# Patient Record
Sex: Female | Born: 1980 | Race: White | Hispanic: No | Marital: Married | Smoking: Current every day smoker
Health system: Southern US, Community
[De-identification: ages and names within clinical notes are randomized; demographics above are authoritative.]

## PROBLEM LIST (undated history)

## (undated) DIAGNOSIS — T753XXA Motion sickness, initial encounter: Secondary | ICD-10-CM

## (undated) DIAGNOSIS — R519 Headache, unspecified: Secondary | ICD-10-CM

## (undated) DIAGNOSIS — Z973 Presence of spectacles and contact lenses: Secondary | ICD-10-CM

## (undated) DIAGNOSIS — Z87442 Personal history of urinary calculi: Secondary | ICD-10-CM

## (undated) DIAGNOSIS — F329 Major depressive disorder, single episode, unspecified: Secondary | ICD-10-CM

## (undated) DIAGNOSIS — R3129 Other microscopic hematuria: Secondary | ICD-10-CM

## (undated) DIAGNOSIS — F419 Anxiety disorder, unspecified: Secondary | ICD-10-CM

## (undated) DIAGNOSIS — R51 Headache: Secondary | ICD-10-CM

## (undated) DIAGNOSIS — F32A Depression, unspecified: Secondary | ICD-10-CM

## (undated) HISTORY — DX: Depression, unspecified: F32.A

## (undated) HISTORY — PX: MANDIBLE FRACTURE SURGERY: SHX706

## (undated) HISTORY — DX: Anxiety disorder, unspecified: F41.9

## (undated) HISTORY — DX: Other microscopic hematuria: R31.29

## (undated) HISTORY — DX: Major depressive disorder, single episode, unspecified: F32.9

## (undated) HISTORY — PX: OTHER SURGICAL HISTORY: SHX169

## (undated) HISTORY — DX: Personal history of urinary calculi: Z87.442

## (undated) HISTORY — PX: BREAST SURGERY: SHX581

---
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
## 2002-03-18 ENCOUNTER — Other Ambulatory Visit: Admission: RE | Admit: 2002-03-18 | Discharge: 2002-03-18 | Payer: Self-pay | Admitting: Obstetrics and Gynecology

## 2002-04-03 ENCOUNTER — Encounter: Payer: Self-pay | Admitting: Obstetrics and Gynecology

## 2002-04-03 ENCOUNTER — Ambulatory Visit (HOSPITAL_COMMUNITY): Admission: RE | Admit: 2002-04-03 | Discharge: 2002-04-03 | Payer: Self-pay | Admitting: Obstetrics and Gynecology

## 2002-04-17 ENCOUNTER — Observation Stay (HOSPITAL_COMMUNITY): Admission: AD | Admit: 2002-04-17 | Discharge: 2002-04-18 | Payer: Self-pay | Admitting: Obstetrics and Gynecology

## 2002-08-17 ENCOUNTER — Inpatient Hospital Stay (HOSPITAL_COMMUNITY): Admission: AD | Admit: 2002-08-17 | Discharge: 2002-08-17 | Payer: Self-pay | Admitting: Obstetrics and Gynecology

## 2002-08-17 ENCOUNTER — Inpatient Hospital Stay (HOSPITAL_COMMUNITY): Admission: AD | Admit: 2002-08-17 | Discharge: 2002-08-20 | Payer: Self-pay | Admitting: Obstetrics and Gynecology

## 2002-09-24 ENCOUNTER — Encounter (INDEPENDENT_AMBULATORY_CARE_PROVIDER_SITE_OTHER): Payer: Self-pay | Admitting: Specialist

## 2002-09-24 ENCOUNTER — Encounter: Payer: Self-pay | Admitting: Obstetrics and Gynecology

## 2002-09-24 ENCOUNTER — Encounter: Admission: RE | Admit: 2002-09-24 | Discharge: 2002-09-24 | Payer: Self-pay | Admitting: Obstetrics and Gynecology

## 2002-09-24 ENCOUNTER — Other Ambulatory Visit: Admission: RE | Admit: 2002-09-24 | Discharge: 2002-09-24 | Payer: Self-pay | Admitting: Radiology

## 2003-03-31 ENCOUNTER — Other Ambulatory Visit: Admission: RE | Admit: 2003-03-31 | Discharge: 2003-03-31 | Payer: Self-pay | Admitting: Obstetrics and Gynecology

## 2004-03-25 ENCOUNTER — Emergency Department (HOSPITAL_COMMUNITY): Admission: EM | Admit: 2004-03-25 | Discharge: 2004-03-25 | Payer: Self-pay | Admitting: Emergency Medicine

## 2004-04-14 ENCOUNTER — Other Ambulatory Visit: Admission: RE | Admit: 2004-04-14 | Discharge: 2004-04-14 | Payer: Self-pay | Admitting: Obstetrics and Gynecology

## 2005-06-14 ENCOUNTER — Other Ambulatory Visit: Admission: RE | Admit: 2005-06-14 | Discharge: 2005-06-14 | Payer: Self-pay | Admitting: Obstetrics and Gynecology

## 2007-09-25 ENCOUNTER — Emergency Department (HOSPITAL_COMMUNITY): Admission: EM | Admit: 2007-09-25 | Discharge: 2007-09-26 | Payer: Self-pay | Admitting: Emergency Medicine

## 2008-01-15 ENCOUNTER — Emergency Department (HOSPITAL_COMMUNITY): Admission: EM | Admit: 2008-01-15 | Discharge: 2008-01-15 | Payer: Self-pay | Admitting: Emergency Medicine

## 2008-11-14 ENCOUNTER — Inpatient Hospital Stay (HOSPITAL_COMMUNITY): Admission: AD | Admit: 2008-11-14 | Discharge: 2008-11-16 | Payer: Self-pay | Admitting: Obstetrics and Gynecology

## 2008-11-14 ENCOUNTER — Encounter (INDEPENDENT_AMBULATORY_CARE_PROVIDER_SITE_OTHER): Payer: Self-pay

## 2011-05-03 NOTE — H&P (Signed)
Janice, Gray NO.:  [id number]   MEDICAL RECORD NO.:  [id number]          PATIENT TYPE:  INP   LOCATION:  9163                          FACILITY:  [facility]   PHYSICIAN:  Osborn Coho, M.D.   DATE OF BIRTH:  August 08, 1981   DATE OF ADMISSION:  11/14/2008  DATE OF DISCHARGE:                              HISTORY & PHYSICAL   [name] is a 30 year old gravida 4, para 1-0-2-1 at 37-2/7 weeks who  presents today with spontaneous rupture of membranes at approximately 4  a.m. and onset of contractions prior to that time now every 5 to 7  minutes.  Pregnancy has been remarkable for:   1. Anxiety and depression with patient on Prozac during this      pregnancy.  2. Conception on oral contraceptive.  3. History of genital warts.  4. History of STDs in the past.  5. Irritable bowel syndrome.  6. History of abnormal Pap with occult blood in the past.  7. Decreased body mass index.  8. Negative group B strep.   PRENATAL LABORATORIES:  Blood type is B+, Rh antibody negative, VDRL  nonreactive, rubella titer positive, hepatitis B surface antigen  negative, HIV nonreactive, toxoplasmosis titers were negative, GC and  chlamydia cultures were negative in the 1st trimester.  Pap was normal.  First trimester screen was normal.  AFP was normal.  Glucola was 91.  Hemoglobin upon entry into practice was 12.8.  It was 11.8 at 26 weeks.  Group B strep and other cultures were negative at 36 weeks.   HISTORY OF PRESENT PREGNANCY:  The patient entered care at approximately  10 weeks.  She had been on Prozac and was followed by mental health.  She conceived on oral contraceptives.  She had a 1st trimester screen  that was normal.  She was treated for an upper respiratory infection at  14 weeks.  She had an ultrasound at her 1st prenatal visit with dating  indicative of an EDC of December 19.  Her dates by LMP were December 16.  At 18 weeks she had another ultrasound showing good  congruency with  dates.  She had adequate weight gain during her pregnancy.  She was  placed on iron at 28 weeks.  She received H1N1 vaccine at 34 weeks.  Group B strep culture and other cultures were done at 36 weeks.   OBSTETRICAL HISTORY:  In 2003 she had a vaginal birth of a female  infant, weight 6 pounds 7 ounces, at 40 weeks.  She was in labor 27  hours.  She had epidural anesthesia.  In 2007 she had a miscarriage.  In  2008 she had a termination of pregnancy.   MEDICAL HISTORY:  She was previously [place] and Yasmin user.  She did  conceive on [place].  She reports abnormal Pap in the past, but  colposcopies were normal.  She had Chlamydia several years before as  well as gonorrhea more than 2 years ago.  She has a history of genital  warts.  Her last outbreak was before her 1st pregnancy.  She reports  usual childhood illnesses.  She does get occasional yeast infections.  She has irritable bowel syndrome with constipation component, but is not  on any medication.  She has a history of some frequent UTIs; last one  about 4 months before her pregnancy.   SURGICAL HISTORY:  1. Surgery on jaw approximately 2001 with spacers placed.  2. She also has previously reported TAB.   She has no known medication allergies.   FAMILY HISTORY:  Maternal grandmother had some kind of kidney surgery.   SOCIAL HISTORY:  The patient is single.  Father of the baby is involved  and supportive.  His name is [employer]  Patient has some college.  She is a Set designer.  Her partner is high school educated.  He is  self-employed.  She is Caucasian.  She denies any religious affiliation.  She does have a history of anxiety and depression, and has been on  medication most of her adult life.  She is currently on Prozac.   GENETIC HISTORY:  Remarkable for father of the baby's 1st cousin born  with a nuchal cord which caused mental retardation, and  father of the  baby's father has multiple  sclerosis.   PHYSICAL EXAMINATION:  VITAL SIGNS:  Stable.  The patient is afebrile.  HEENT:  Within normal limits.  LUNGS:  Breath sounds are clear.  HEART:  Regular rate and rhythm without murmur.  BREASTS:  Soft and nontender.  ABDOMEN:  Fundal height is approximately 37 cm.  Estimated fetal weight  is 6 pounds.  Uterine contractions are every 3-5 minutes of moderate  quality.  The patient is noted to be leaking light meconium-stained  fluid.  Fetal heart rate is currently reassuring with no decelerations.  Cervix is 4, 80% vertex, at a -1 to a 0 station. EXTREMITIES:  Deep  tendon reflexes are 2+ without clonus.  There is a trace edema.   IMPRESSION:  1. Intrauterine pregnancy at 37-2/7 weeks.  2. Early labor with spontaneous rupture of membranes in light      meconium.  3. Negative group B strep.   PLAN:  1. Admit to the birthing suite with consult with Dr. Osborn Coho      who is attending physician.  2. Routine certified nurse midwife orders.  3. The patient does plan epidural.      Chip Boer L. Emilee Hero, C.N.M.      Osborn Coho, M.D.  Electronically Signed    VLL/MEDQ  D:  11/14/2008  T:  11/14/2008  Job:  [id number]

## 2011-05-06 NOTE — Discharge Summary (Signed)
[facility]  Patient:    Janice Gray, Janice Gray Visit Number: [id number] MRN: [id number]          Service Type: OBS Location: 9300 [id number] Attending Physician:  Leonard Schwartz Dictated by:   Saverio Danker, C.N.M. Admit Date:  04/17/2002 Discharge Date: 04/18/2002                             Discharge Summary  ADMISSION DIAGNOSES: 1. Intrauterine pregnancy at 22 weeks. 2. Nausea, vomiting, questionable viral syndrome.  DISCHARGE DIAGNOSES: 1. Intrauterine pregnancy at 22 weeks. 2. Nausea, vomiting, questionable viral syndrome. 3. Resolved nausea and vomiting.  PROCEDURES THIS ADMISSION:    None.  HOSPITAL COURSE:              Janice Gray is an 30 year old single white female, gravida 1, para 0, at approximately [redacted] weeks gestation, who presented with nausea and vomiting for the last 24 hours and has been unable to keep anything down.  She was ketonuric on admission and had 30 of protein and moderate blood.  She denied any vaginal bleeding, leaking or cramping.  She was started on IV hydration with IV Phenergan and has tolerated liquids and diet this morning without difficulty.  She has no further nausea or vomiting since 9:15 p.m. on April 17, 2002.  She is requesting to go home at this time.  Her urinalysis is negative for ketones, negative for protein and negative for blood and has a specific gravity of 1.020.  She is deemed ready for discharge this morning.  DISCHARGE INSTRUCTIONS:       She is to call for reoccurring nausea and vomiting, vaginal leaking or bleeding or other signs of preterm labor and for fever of greater than 100.4.  FOLLOWUP:                     Her discharge followup will be as scheduled at [facility] or as needed.  DISCHARGE LABORATORY AND ACCESSORY DATA:  Her hemoglobin is 12.6, her WBC count is 13.9, her platelets are 295,000.  Her AST is 23, her ALT is 15, her lipase is 20, her amylase is 86.  Her  urine is completely negative this time and specific gravity of 1.020.  DISCHARGE MEDICATIONS:        Phenergan 25 mg p.o. q.6h. p.r.n. for nausea and vomiting and the patient has a prescription for this at home. Dictated by:   Vance Gather Duplantis, C.N.M. Attending Physician:  Leonard Schwartz DD:  04/18/02 TD:  04/21/02 Job: [id number] [id number]

## 2011-05-06 NOTE — Op Note (Signed)
[place]  Patient:    Janice Gray, Janice Gray                          MRN: [id number] Proc. Date: 08/01/00 Adm. Date:  [id number] Disc. 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  SURGERY PERFORMED:  Bilateral sagittal split ramus osteotomies with mandibular advancement of approximately 11 mm, insertion of custom Acrylic interocclusal plates, rigid internal fixation using the Zachery Dauer 2.0 mm titanium system.  SURGEON: Hewitt Blade, M.D.  FIRST ASSISTANT:  Marshall  ANESTHESIA:  General via nasoendotracheal intubation.  ESTIMATED BLOOD LOSS: Less than 200 cc.  FLUIDS REPLACED:  Approximately 1500 cc of crystalloid solution.  COMPLICATIONS:  None apparent.  INDICATION FOR PROCEDURE:  [name] is a 30 year old female who was referred to my office for evaluation and treatment of her mandibular retrognathia.  thp has had chronic problems with biting, chewing, and incising food in a normal type fashion as well as chronic temporomandibular joint dysfunction.  All conservative measures have been attempted to correct this but the deformity was too severe for any less intensive means of treatment other than surgical intervention.  DETAILS OF PROCEDURE:  On 08/01/00, Ms. Lewis was taken to [facility] hospital main operating suite where she was placed on the operating room table in a supine position.  Following successful nasoendotracheal intubation and general anesthesia, the patients face, neck, and oral cavity were prepped and draped in the usual sterile operating room fashion.  The hypopharynx was suctioned free of fluids and secretions,  and a moistened two inch vaginal pack was placed as a throat pack.  Attention was then directed intraorally, where approximately 8 cc of 0.5% Xylocaine containing 1:200,000 epinephrine were infiltrated in the right inferior alveolar neurovascular region, the soft tissues overlying the ascending ramus and right posterior mandibular buccal vestibule.  A #15 Bard-Parker blade was then used to create a 2.5 curvilinear incision beginning in the soft tissues over the ramus and brought through the mucoperiosteal tissues of the right lateral oblique ridge to a distance of approximately 1.5 cm lateral to the first molar tooth.  The #9 periosteal elevator was then used to reflect a full thickness mucoperiosteal flap which was elevated laterally and inferiorly to the inferior border of the mandible which was identified using curved freer elevator.  The mucoperiosteal tissues were then reflected off of the oblique ridge and up the ascending ramus to a distance of approximately 2.0 cm from the tip of the coronoid process where a Demon bone clamp was placed to retract the soft tissue superiorly.  The Stryker rotary osteotome with a 107 fissure bur followed by a Lindeman bur was then used to create a medial cortical horizontal osteotomy from just above the lingula where a Sheldon retractor was placed to protect the neurovascular bundle to the anterior midline of the ramus.  A channel retractor was then placed into the incision to protect the lateral soft tissues and the Lindeman bur with the Stryker rotary osteotome was used to create a lateral, vertical, cortical osteotomy from the inferior border of the mandible to a distance  of approximately 1.0 cm lateral to the second molar tooth.  The osteotomies were then joined across the superior oblique ridge through the superior cortical bone using the 107 fissure bur.  In a similar fashion, the left mandible was cut in preparation for a sagittal split ramus  osteotomy.  With the mandibular osteotome and gentle tapping pressures from a fiberglass tipped mallet, the left mandible was then split in a sagittal fashion.  The neurovascular bundle remained intact.   A pterygomasseteric sling stripper was then used to detach the pterygomasseteric apparatus from the distal portion of the mandible.  In a similar fashion, the right mandible was split in a sagittal fashion, the neurovascular bundle remaining intact, and the pterygomasseteric apparatus detached.  A previously constructed custom Acrylic intraocclusal appliance was then affixed to the maxillary dentition, fitting appropriately, and was secured by passing four 28 gauge stainless steel wire ligature loops in a circumorthodontic fashion.  The ends of the wires were cut, twisted, and rosetted atraumatically against the buccal surface of the maxillary gingival soft tissues.  The throat pack was removed, and the hypopharynx suctioned clear clear of fluids and secretions.  The mandible was now advanced approximately 11 mm and the mandibular dentition fit appropriately into the under surface of the appliance.  The mandible was now secured in its mid position by passing 26 gauge stainless steel wire over two loops with a circumorthodontic fashion.  The ends of the wires cut, twisted, and rosetted atraumatically against the buccal surface of the dentition.  The condylar segments were then positioned posteriorly and superiorly into the glenoid fossa using a Kocher bone clamp and a wire directing instrument. Digital pressure was used to ascertain fitting of the condyles bilaterally. The condylar segments were then secured in this position by passing bilateral a 24 gauge circumorthodontic round ligature stainless steel loops.  The ends of the wires twisted and rosetted against the superior oblique ridge portion of the mandible.  Rigid internal fixation was then applied in the following manner.  A 3 M  mini driver with a 1.5 mm drill was used to create three bicortical holes across the superior oblique bridge portion of the mandible bilaterally.  The holes  were then tapped using the Zachery Dauer tap, an appropriate 2.0 mm titanium screws were placed with three being placed on either side of the mandible. The aforementioned 24 gauge wires were then cut and removed from the surgical site.  The surgical site was then copiously irrigated with sterile saline irrigating solution and suctioned. The mucoperiosteal margins were approximated and sutured in a continuous interlocking fashion using 4-0 Vicryl suture material on a RV 1 needle.  He had maxillary fixation loops were then cut and removed from the oral cavity.  The mandible was now manipulated in an opened and closed position and fitting appropriately into the under surface of the appliance without deviation or hindrance.  Class 1 box Silastics  were then placed on the canine region bilaterally and the patient was allowed to awaken from the anesthesia and taken to the recovery room  where she tolerated the procedure well and without apparent complication. DD:  08/05/00 TD:  08/06/00 Job: [id number]

## 2011-05-06 NOTE — H&P (Signed)
Janice Gray, Janice Gray                           ACCOUNT NO.:  [id number]   MEDICAL RECORD NO.:  [id number]                   PATIENT TYPE:  INP   LOCATION:  9126                                 [facility]ILITY:  [facility]   PHYSICIAN:  Cam Hai, C.N.M.               DATE OF BIRTH:  Sep 26, 1981   DATE OF ADMISSION:  08/17/2002  DATE OF DISCHARGE:                                HISTORY & PHYSICAL   HISTORY:  The patient is a 30 year old single white female primigravida at  39-3/7ths weeks' who presents with regular uterine contractions all day. She  denies leaking, bleeding, headache, nausea or vomiting.  She reports  positive fetal movement.  She had a labor evaluation earlier today with  cervical examination of 2-3 cm.  Her pregnancy has been followed by the  [facility] Certified Nurse Midwife Service and is remarkable  for:  1. Chronic constipation and irritable bowel syndrome.  2. History of depression.  3. Unknown last menstrual period.  4. Smoker.  5. History of abnormal Pap smear.  6. Group B Strep negative.   Her prenatal laboratories were collected on January 29, 2002:  Hemoglobin  11.9, hematocrit 35.6, platelets 292,000, blood type B+, antibody negative,  RPR nonreactive, rubella immune, hepatitis B surface antigen negative, HIV  nonreactive, Pap smear within normal limits, gonorrhea negative, Chlamydia  negative.  February 20, 2002 her serum alpha-fetoprotein was within normal  range.  On June 29, 2002 her one-hour Glucola was 102 with hemoglobin of  11.8.  Culture of the vaginal tract for Group B Strep on July 25, 2002 was  negative.   HISTORY OF PRESENT PREGNANCY:  She presented for care on January 30, 2002  at approximately 12-14 weeks' gestation.  She resumed Paxil at her OB visit,  which she had been taking prior to the pregnancy but stopped with a positive  pregnancy test.  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  This was December 2002 with colposcopy recommended in March 2003.  Pregnancy  ultrasonography gave her [facility] of August 21, 2002.  Pregnancy  ultrasonography at 17 weeks', four-chamber heart and chest not seen.  She  had follow up ultrasound at [facility] which confirmed the anatomy.  Dr.  Pennie Rushing reviewed her Pap smear history and recommended that she have a  repeat Pap smear in October 2003.  She was measuring slightly size less than  dates so pregnancy ultrasonography at 28 weeks' was consistent with previous  growth and EDC.  Fluid was within normal limits.  Estimated fetal weight was  in the 39th - 42nd percentile.  Patient continues smoking one to two  cigarettes a day throughout the pregnancy.  It was recommended that she  stop.  Another pregnancy ultrasonography was done for growth at 35 weeks'  which showed  estimated fetal weight at 50th - 75th percentile with normal  amniotic fluid volume and cephalic presentation.  Rest of her prenatal care  was unremarkable.   OBSTETRICAL HISTORY:  Primigravida.   ALLERGIES:  No medication allergies.   PAST MEDICAL HISTORY:  She stopped using Yasmin  for birth control in  September 2002.  In November 2002 she had abnormal Pap smear with colposcopy  in December 2002.  In 2000 she was treated for positive gonorrhea.  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  She has an occasional yeast infection.  She reports having had the usual childhood illnesses.  She has history of  irritable bowel syndrome manifested by constipation.  She has urinary tract  infection every few months.  She has a history of migraine headaches.  She  has a history of depression and anxiety for which she sees Dr. Fabian Sharp.   PAST SURGICAL HISTORY:  1. Wisdom teeth extraction at the age of 43.  2. Jaw surgery two years ago.   FAMILY HISTORY:  Remarkable for maternal grandmother with questionable  anemia.  Maternal  grandmother with kidney problems.  Maternal grandmother  and mother with hypertension.  Patient's mother is a smoker.  Patient has  history of smoking as well.   GENETIC HISTORY:  Genetic history is negative.   SOCIAL HISTORY:  Father of the baby's name is Dorinda Hill.  He is involved and  supportive.  Patient has one year of college.  Father of baby has ninth  grade education.  No alcohol or illicit drug use with the pregnancy.   PHYSICAL EXAMINATION:  GENERAL:  She is afebrile.  HEENT:  Grossly within normal limits.  CHEST:  Clear to auscultation.  HEART:  Regular rate and rhythm.  ABDOMEN:  Gravid in contour.  Fundal height extending approximately 37 - 38  cm above the pubic symphysis.  Electronic fetal monitoring shows reactive  reassuring fetal heart rate.  Uterine contractions every five minutes.  Cervix 4 cm, 80%, vertex, -1 per R.N. examination.   ASSESSMENT:  1. Intrauterine pregnancy at term.  2. Early labor.  3. Group B Strep negative.   PLAN:  1. Admit to birthing unit for consult with Dr. Normand Sloop.  2. Routine M.D. orders.  3. Plan epidural for labor.                                               Cam Hai, C.N.M.    KS/MEDQ  D:  08/18/2002  T:  08/18/2002  Job:  [id number]

## 2011-05-06 NOTE — H&P (Signed)
[facility]  Patient:    Janice Gray, POLIMENI Visit Number: [id number] MRN: [id number]          Service Type: OBS Location: [facility] Attending Physician:  Leonard Schwartz Dictated by:   Mack Guise, C.N.M. Admit Date:  04/17/2002                           History and Physical  HISTORY OF PRESENT ILLNESS:   [name] is an 30 year old gravida 1 para 0 at 22 weeks by pregnancy ultrasonography.  She presents to MAU with nausea and vomiting since approximately midnight last night.  She has been unable to keep anything down, and although she has a history of chronic constipation and IBS, she now has severe diarrhea.  Phenergan suppositories at home have been ineffective.  This patient has a significant history for decreased BMI.  She is 5 feet 6 inches and entered pregnancy at 93 pounds.  At 22 weeks she is now 103 pounds.  She has no history of hyperemesis and questions whether she possibly has food poisoning, as her boyfriend also has had vomiting since eating out last evening.  Her pregnancy has been followed by the CNM service at [facility] and is remarkable for: 1. Questionable LMP. 2. Low BMI. 3. Chronic constipation, irritable bowel syndrome. 4. Depression and anxiety. 5. Smoker. 6. Abnormal Pap smear.  This patient was initially evaluated at the office of [facility] at approximately [redacted] weeks gestation.  Her pregnancy has been complicated by abnormal Pap smear and decreased BMI, depression and anxiety.  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 Fabian Sharp.  Colposcopy with biopsy showing CIN 1 in March 18, 2002 colposcopy.  Patient will follow up with Pap smears in October 2003 and then as indicated.  She has a history of gonorrhea treated in 2000, positive HPV.  She has a history of depression and anxiety, irritable bowel syndrome, chronic constipation.  SURGICAL HISTORY:             Wisdom teeth  age 61, jaw surgery two years ago.  FAMILY HISTORY:               Maternal great-grandfather with heart disease. Maternal great-grandmother with varicose veins.  Maternal grandmother with anemia.  Maternal grandmother with kidney problems.  The patient as well as her mother and maternal grandmother have a history of migraine headaches.  HABITS:                       Patient as well as her mother is a smoker. Patient denies the use of alcohol or illicit drugs.  GENETIC HISTORY:              There is no genetic or familial history of chromosomal disorders, children that died in infancy or that were born with birth defects.  SOCIAL HISTORY:               [name] is an 30 year old Caucasian female. The father of the baby, Janice Gray, is involved and supportive.  They are [employer] in their faith.  PHYSICAL EXAMINATION:  VITAL SIGNS:                  Stable, afebrile.  GENERAL:  Patient appears emaciated and pale.  HEENT:                        Unremarkable.  HEART:                        Regular rate and rhythm.  LUNGS:                        Clear.  ABDOMEN:                      Soft and nontender.  Positive Doppler fetal heart tones at 160.  EXTREMITIES:                  Show no pathologic edema.  DTRs are 1+ with no clonus.  LABORATORY DATA:              Clean catch urine finds specific gravity greater than 1.030, moderate hemoglobin, 40 ketones, 30 protein.  CBC shows WBC 13.9, hemoglobin 12.6; hematocrit 37.2; platelets 295,000.  Comprehensive metabolic panel is all within normal limits.  Lipase is 20 and amylase is 86.  ASSESSMENT:                   1. Intrauterine pregnancy at 22 weeks.                               2. Nausea and vomiting.                               3. Questionable viral syndrome, questionable                                  food poisoning, questionable bulimia or                                  anorexia.  PLAN:                          1. Admit per Dr. Marline Backbone for 23-hour                                  observation for IV hydration.                               2. D5-LR with multivitamins.                               3. Advance diet as tolerated.  Dictated by: Mack Guise, C.N.M. Attending Physician:  Leonard Schwartz DD:  04/17/02 TD:  04/17/02 Job: [id number]

## 2011-09-08 LAB — URINALYSIS, ROUTINE W REFLEX MICROSCOPIC
Bilirubin Urine: NEGATIVE
Ketones, ur: NEGATIVE
Protein, ur: 30 — AB
Urobilinogen, UA: 1

## 2011-09-08 LAB — POCT PREGNANCY, URINE
Operator id: 285491
Preg Test, Ur: NEGATIVE

## 2011-09-08 LAB — URINE MICROSCOPIC-ADD ON

## 2011-09-20 LAB — CBC
HCT: 27.9 — ABNORMAL LOW
HCT: 32.3 — ABNORMAL LOW
Hemoglobin: 10.8 — ABNORMAL LOW
Hemoglobin: 9.3 — ABNORMAL LOW
MCHC: 33.3
MCHC: 33.5
MCV: 86.7
MCV: 87.4
Platelets: 329
Platelets: 365
RBC: 3.19 — ABNORMAL LOW
RBC: 3.73 — ABNORMAL LOW
RDW: 14.5
RDW: 14.8
WBC: 15.5 — ABNORMAL HIGH
WBC: 19.5 — ABNORMAL HIGH

## 2011-09-20 LAB — DIFFERENTIAL
Basophils Absolute: 0
Lymphocytes Relative: 11 — ABNORMAL LOW
Monocytes Absolute: 0.9
Monocytes Relative: 5
Neutro Abs: 16.2 — ABNORMAL HIGH
Neutrophils Relative %: 83 — ABNORMAL HIGH

## 2011-09-20 LAB — RPR: RPR Ser Ql: NONREACTIVE

## 2011-09-29 LAB — URINALYSIS, ROUTINE W REFLEX MICROSCOPIC
Bilirubin Urine: NEGATIVE
Glucose, UA: NEGATIVE
Ketones, ur: NEGATIVE
Nitrite: NEGATIVE
Protein, ur: NEGATIVE
Specific Gravity, Urine: 1.02
Urobilinogen, UA: 1
pH: 6.5

## 2011-09-29 LAB — POCT PREGNANCY, URINE
Operator id: 272551
Preg Test, Ur: NEGATIVE

## 2011-09-29 LAB — URINE MICROSCOPIC-ADD ON

## 2012-08-23 ENCOUNTER — Emergency Department: Payer: Self-pay | Admitting: Emergency Medicine

## 2012-08-23 LAB — URINALYSIS, COMPLETE
Glucose,UR: NEGATIVE mg/dL (ref 0–75)
Nitrite: NEGATIVE
Ph: 6 (ref 4.5–8.0)

## 2013-06-16 ENCOUNTER — Emergency Department: Payer: Self-pay | Admitting: Internal Medicine

## 2013-06-16 LAB — URINALYSIS, COMPLETE
Nitrite: NEGATIVE
Ph: 5 (ref 4.5–8.0)
Squamous Epithelial: 4

## 2013-06-16 LAB — BASIC METABOLIC PANEL
BUN: 15 mg/dL (ref 7–18)
Chloride: 107 mmol/L (ref 98–107)
Creatinine: 0.85 mg/dL (ref 0.60–1.30)
EGFR (Non-African Amer.): >60
Glucose: 139 mg/dL — ABNORMAL HIGH (ref 65–99)
Osmolality: 284 (ref 275–301)

## 2013-06-16 LAB — HEPATIC FUNCTION PANEL A (ARMC)
Albumin: 4.3 g/dL (ref 3.4–5.0)
Bilirubin, Direct: <0.1 mg/dL (ref 0.00–0.20)
SGOT(AST): 16 U/L (ref 15–37)

## 2013-06-16 LAB — CBC
MCH: 30.7 pg (ref 26.0–34.0)
MCHC: 34.5 g/dL (ref 32.0–36.0)
RDW: 13.2 % (ref 11.5–14.5)

## 2013-06-21 LAB — CULTURE, BLOOD (SINGLE)

## 2014-01-30 ENCOUNTER — Emergency Department: Payer: Self-pay | Admitting: Emergency Medicine

## 2014-01-30 LAB — URINALYSIS, COMPLETE
Bilirubin,UR: NEGATIVE
Glucose,UR: NEGATIVE mg/dL
Ketone: NEGATIVE
Nitrite: NEGATIVE
Ph: 5
Protein: NEGATIVE
RBC,UR: 151 /HPF
Specific Gravity: 1.021
Squamous Epithelial: 2
WBC UR: 246 /HPF

## 2015-03-30 ENCOUNTER — Other Ambulatory Visit
Admit: 2015-03-30 | Disposition: A | Discharge status: Home / Self Care | Payer: Self-pay | Attending: Psychiatry | Admitting: Psychiatry

## 2015-03-30 LAB — TSH: THYROID STIMULATING HORM: 1.866 u[IU]/mL

## 2015-05-25 ENCOUNTER — Other Ambulatory Visit: Payer: Self-pay | Admitting: Psychiatry

## 2015-05-26 LAB — LIPID PANEL W/O CHOL/HDL RATIO
Cholesterol, Total: 150 mg/dL (ref 100–199)
HDL: 48 mg/dL (ref >39)
LDL CALC: 87 mg/dL (ref 0–99)
Triglycerides: 75 mg/dL (ref 0–149)
VLDL CHOLESTEROL CAL: 15 mg/dL (ref 5–40)

## 2015-05-26 LAB — GLUCOSE, RANDOM: Glucose: 76 mg/dL (ref 65–99)

## 2015-06-02 NOTE — Progress Notes (Signed)
Spokes patient on the phone today and informed her that her metabolic labs were all within normal limits. However while on the phone with her she asked about increasing her Seroquel from 50 mg to 100 mg. She states she's not had any side effects on the 50 mg thus far. Writer indicated this would be a reasonable increase and patient has follow-up already scheduled for 06/29/2015.

## 2015-06-29 ENCOUNTER — Encounter: Payer: Self-pay | Admitting: Psychiatry

## 2015-06-29 ENCOUNTER — Ambulatory Visit (INDEPENDENT_AMBULATORY_CARE_PROVIDER_SITE_OTHER): Payer: BLUE CROSS/BLUE SHIELD | Admitting: Psychiatry

## 2015-06-29 VITALS — BP 98/62 | HR 85 | Temp 97.9°F | Ht 66.0 in | Wt 136.4 lb

## 2015-06-29 DIAGNOSIS — F329 Major depressive disorder, single episode, unspecified: Secondary | ICD-10-CM | POA: Insufficient documentation

## 2015-06-29 DIAGNOSIS — F331 Major depressive disorder, recurrent, moderate: Secondary | ICD-10-CM

## 2015-06-29 DIAGNOSIS — F4010 Social phobia, unspecified: Secondary | ICD-10-CM | POA: Diagnosis not present

## 2015-06-29 DIAGNOSIS — F32A Depression, unspecified: Secondary | ICD-10-CM | POA: Insufficient documentation

## 2015-06-29 MED ORDER — QUETIAPINE FUMARATE 100 MG PO TABS: 150.0000 mg | ORAL_TABLET | Freq: Every day | ORAL | Status: DC

## 2015-06-29 MED ORDER — FLUOXETINE HCL 40 MG PO CAPS: 80.0000 mg | ORAL_CAPSULE | Freq: Two times a day (BID) | ORAL | Status: DC

## 2015-06-29 NOTE — Progress Notes (Signed)
BH MD/PA/NP OP Progress Note  06/29/2015 4:10 PM Janice Gray  MRN:  [id number]   Subjective: Patient presents for follow-up of her major depressive disorder and social anxiety disorder. She states overall things have been about the same. She did report she's had a couple of crying episodes with the most recent one being this past weekend. She does discuss some social stressors. She states that they recently moved her mother-in-law out of the house and that there were some financial costs related to this and now there is some financial stress.  She has not noticed much with the augmentation of Seroquel. She did state that one night she didn't take it and she noted she could not sleep. We have discussed alternatives to augmenting her Prozac such as using rig Rexulti.  She had a previous trial of  Abilify which caused some issues with her eyes. In addition we discussed whether it might make sense to switch her antidepressant from Prozac to something else and assess for response and augmentation to that antidepressant. He also relates that she had prior trial with Wellbutrin however related that the XL form cause problems while the SR may have been better tolerated and thus this might be something we can augment another antidepressant with. However patient has been tolerating the Seroquel well and we agreed the best plan at this point may be to increase her Seroquel from 100 mg at bedtime to 150 g at bedtime.  Chief Complaint:  Visit Diagnosis:  No diagnosis found.  Past Medical History:  Past Medical History  Diagnosis Date  . Anxiety   . Depression     Past Surgical History  Procedure Laterality Date  . Breast surgery    . Mandible fracture surgery     Family History:  Family History  Problem Relation Age of Onset  . Family history unknown: Yes   Social History:  History   Social History  . Marital Status: Single    Spouse Name: N/A  . Number of Children: N/A  . Years of Education:  N/A   Social History Main Topics  . Smoking status: Current Every Day Smoker -- 0.50 packs/day    Types: E-cigarettes, Cigarettes    Start date: 06/28/2000  . Smokeless tobacco: Never Used  . Alcohol Use: No     Comment: special occ./ social  . Drug Use: No  . Sexual Activity: Yes    Birth Control/ Protection: None   Other Topics Concern  . None   Social History Narrative  . None   Additional History:   Assessment:   Musculoskeletal: Strength & Muscle Tone: within normal limits Gait & Station: normal Patient leans: N/A  Psychiatric Specialty Exam: HPI  Review of Systems  Psychiatric/Behavioral: Positive for depression (She relates it is the same and has not gotten worse). Negative for suicidal ideas, hallucinations, memory loss and substance abuse. The patient is not nervous/anxious and does not have insomnia.     Blood pressure 98/62, pulse 85, temperature 97.9 F (36.6 C), temperature source Tympanic, height  (1.676 m), weight 136 lb 6.4 oz (61.871 kg), last menstrual period 06/29/2015, SpO2 99 %.Body mass index is 22.03 kg/(m^2).  General Appearance: Neat and Well Groomed  Eye Contact:  Good  Speech:  Normal Rate  Volume:  Normal  Mood:  same  Affect:  Constricted  Thought Process:  Linear and Logical  Orientation:  Full (Time, Place, and Person)  Thought Content:  Negative  Suicidal Thoughts:  No  Homicidal Thoughts:  No  Memory:  Immediate;   Good Recent;   Good Remote;   Good  Judgement:  Good  Insight:  Good  Psychomotor Activity:  Negative  Concentration:  Good  Recall:  Good  Fund of Knowledge: Good  Language: Good  Akathisia:  Negative  Handed:  Right unknown  AIMS (if indicated):  Not done  Assets:  Communication Skills Desire for Improvement Social Support Vocational/Educational  ADL's:  Intact  Cognition: WNL  Sleep:  good   Is the patient at risk to self?  No. Has the patient been a risk to self in the past 6 months?  No. Has the  patient been a risk to self within the distant past?  No. Is the patient a risk to others?  No. Has the patient been a risk to others in the past 6 months?  No. Has the patient been a risk to others within the distant past?  No.  Current Medications: Current Outpatient Prescriptions  Medication Sig Dispense Refill  . etonogestrel (NEXPLANON) 68 MG IMPL implant Inject into the skin.    Marland Kitchen. FLUoxetine (PROZAC) 40 MG capsule Take 2 capsules (80 mg total) by mouth 2 (two) times daily. 60 capsule 3  . QUEtiapine (SEROQUEL) 100 MG tablet Take 1.5 tablets (150 mg total) by mouth at bedtime. 45 tablet 2   No current facility-administered medications for this visit.    Medical Decision Making:  Established Problem, Stable/Improving (1)  Treatment Plan Summary:Medication management and Plan We will continue her Prozac at 80 mg daily. We will increase her Seroquel from 100 mg at bedtime to 150 g at bedtime. patient will follow up in 1 month   Janice Goinglton Janice Gray 06/29/2015, 4:10 PM

## 2015-07-27 ENCOUNTER — Ambulatory Visit (INDEPENDENT_AMBULATORY_CARE_PROVIDER_SITE_OTHER): Payer: BLUE CROSS/BLUE SHIELD | Admitting: Psychiatry

## 2015-07-27 ENCOUNTER — Encounter: Payer: Self-pay | Admitting: Psychiatry

## 2015-07-27 VITALS — BP 102/72 | HR 84 | Temp 97.3°F | Ht 66.0 in | Wt 140.8 lb

## 2015-07-27 DIAGNOSIS — F331 Major depressive disorder, recurrent, moderate: Secondary | ICD-10-CM | POA: Diagnosis not present

## 2015-07-27 MED ORDER — ALPRAZOLAM 0.25 MG PO TABS: 0.2500 mg | ORAL_TABLET | Freq: Every day | ORAL | Status: DC | PRN

## 2015-07-27 MED ORDER — BUPROPION HCL ER (XL) 150 MG PO TB24: 150.0000 mg | ORAL_TABLET | Freq: Every day | ORAL | Status: DC

## 2015-07-27 MED ORDER — ALPRAZOLAM 0.25 MG PO TABS: 0.2500 mg | ORAL_TABLET | Freq: Two times a day (BID) | ORAL | Status: DC | PRN

## 2015-07-27 NOTE — Progress Notes (Signed)
BH MD/PA/NP OP Progress Note  07/27/2015 4:14 PM Janice Gray  MRN:  [id number]   Subjective: Patient presents for follow-up of her major depressive disorder and social anxiety disorder. She relates that she found that the Seroquel just was not helpful in fact made her more depressed. She stated she began to taper herself off the Seroquel and currently is taking 50 mg at bedtime. I instructed her she could just discontinue the medication.  She indicated she felt like she wanted to try something else besides Prozac. I told her I thought this was a reasonable idea given that she is artery on 80 mg. We revisited her past use of Wellbutrin. She reported some insomnia with the Wellbutrin XL and then in the past with started on Wellbutrin SR but then states she got pregnant and this SR was discontinued. When I reviewed this with her she stated she really wanted to try the XL again. We have discussed that she can taper off the Prozac. I did discuss we could try to taper in 20 mg increments however she states she would prefer to just go down to her 40 mg capsules for 1 week and then discontinue it. She is aware that at that time she will start her Wellbutrin XL after she has been off of her Prozac.  Dates otherwise work continues to go well. She states socially there is less stress given that her mother-in-law has been moved out.  Chief Complaint:  Chief Complaint    Follow-up; Depression     Visit Diagnosis:  No diagnosis found.  Past Medical History:  Past Medical History  Diagnosis Date  . Anxiety   . Depression     Past Surgical History  Procedure Laterality Date  . Breast surgery    . Mandible fracture surgery     Family History:  Family History  Problem Relation Age of Onset  . Family history unknown: Yes   Social History:  History   Social History  . Marital Status: Single    Spouse Name: N/A  . Number of Children: N/A  . Years of Education: N/A   Social History Main Topics   . Smoking status: Current Every Day Smoker -- 0.50 packs/day    Types: E-cigarettes, Cigarettes    Start date: 06/28/2000  . Smokeless tobacco: Never Used  . Alcohol Use: No     Comment: special occ./ social  . Drug Use: No  . Sexual Activity: Yes    Birth Control/ Protection: None   Other Topics Concern  . None   Social History Narrative   Additional History:   Assessment:   Musculoskeletal: Strength & Muscle Tone: within normal limits Gait & Station: normal Patient leans: N/A  Psychiatric Specialty Exam: Depression        Associated symptoms include does not have insomnia and no suicidal ideas.   Review of Systems  Psychiatric/Behavioral: Positive for depression. Negative for suicidal ideas, hallucinations, memory loss and substance abuse. The patient is not nervous/anxious and does not have insomnia.     Blood pressure 102/72, pulse 84, temperature 97.3 F (36.3 C), temperature source Tympanic, height 5\' 6"  (1.676 m), weight 140 lb 12.8 oz (63.866 kg), last menstrual period 06/29/2015, SpO2 99 %.Body mass index is 22.74 kg/(m^2).  General Appearance: Neat and Well Groomed  Eye Contact:  Good  Speech:  Normal Rate  Volume:  Normal  Mood:  more depressed  Affect:  Constricted  Thought Process:  Linear and Logical  Orientation:  Full (Time, Place, and Person)  Thought Content:  Negative  Suicidal Thoughts:  No  Homicidal Thoughts:  No  Memory:  Immediate;   Good Recent;   Good Remote;   Good  Judgement:  Good  Insight:  Good  Psychomotor Activity:  Negative  Concentration:  Good  Recall:  Good  Fund of Knowledge: Good  Language: Good  Akathisia:  Negative  Handed:  Right unknown  AIMS (if indicated):  Not done  Assets:  Communication Skills Desire for Improvement Social Support Vocational/Educational  ADL's:  Intact  Cognition: WNL  Sleep:  good   Is the patient at risk to self?  No. Has the patient been a risk to self in the past 6 months?   No. Has the patient been a risk to self within the distant past?  No. Is the patient a risk to others?  No. Has the patient been a risk to others in the past 6 months?  No. Has the patient been a risk to others within the distant past?  No.  Current Medications: Current Outpatient Prescriptions  Medication Sig Dispense Refill  . FLUoxetine (PROZAC) 40 MG capsule Take 2 capsules (80 mg total) by mouth 2 (two) times daily. 60 capsule 3  . QUEtiapine (SEROQUEL) 100 MG tablet Take 1.5 tablets (150 mg total) by mouth at bedtime. 45 tablet 2  . ALPRAZolam (XANAX) 0.25 MG tablet Take 1 tablet (0.25 mg total) by mouth 2 (two) times daily as needed for anxiety. 60 tablet 0  . buPROPion (WELLBUTRIN XL) 150 MG 24 hr tablet Take 1 tablet (150 mg total) by mouth daily. 30 tablet 1  . etonogestrel (NEXPLANON) 68 MG IMPL implant Inject into the skin.     No current facility-administered medications for this visit.    Medical Decision Making:  Established Problem, Stable/Improving (1)  Treatment Plan Summary:Medication management and Plan patient will take Prozac 40 mg daily for 7 days and then discontinue this. I further instructed her she can discontinue the Seroquel. She will start Wellbutrin XL 150 mg in the morning. Risk and benefits of been discussed patient's able consent. We'll start some alprazolam 0.25 mg twice a day as needed for anxiety. Patient has had this medication as a teenager. Risk and benefits of been discussing patient's able to consent.. patient will follow up in 1 month   Wallace Going 07/27/2015, 4:14 PM

## 2015-07-28 ENCOUNTER — Ambulatory Visit: Payer: BLUE CROSS/BLUE SHIELD | Admitting: Psychiatry

## 2015-08-20 ENCOUNTER — Encounter: Payer: Self-pay | Admitting: *Deleted

## 2015-08-20 ENCOUNTER — Emergency Department
Admission: EM | Admit: 2015-08-20 | Discharge: 2015-08-20 | Disposition: A | Discharge status: Home / Self Care | Payer: BLUE CROSS/BLUE SHIELD | Attending: Emergency Medicine | Admitting: Emergency Medicine

## 2015-08-20 DIAGNOSIS — N23 Unspecified renal colic: Secondary | ICD-10-CM | POA: Diagnosis not present

## 2015-08-20 DIAGNOSIS — R109 Unspecified abdominal pain: Secondary | ICD-10-CM | POA: Diagnosis present

## 2015-08-20 DIAGNOSIS — Z79899 Other long term (current) drug therapy: Secondary | ICD-10-CM | POA: Insufficient documentation

## 2015-08-20 DIAGNOSIS — Z720 Tobacco use: Secondary | ICD-10-CM | POA: Diagnosis not present

## 2015-08-20 LAB — URINALYSIS COMPLETE WITH MICROSCOPIC (ARMC ONLY)
BILIRUBIN URINE: NEGATIVE
Bacteria, UA: NONE SEEN
GLUCOSE, UA: NEGATIVE mg/dL
Ketones, ur: NEGATIVE mg/dL
Leukocytes, UA: NEGATIVE
Nitrite: NEGATIVE
Protein, ur: 30 mg/dL — AB
SQUAMOUS EPITHELIAL / LPF: NONE SEEN
Specific Gravity, Urine: 1.018 (ref 1.005–1.030)
pH: 5 (ref 5.0–8.0)

## 2015-08-20 LAB — CBC
HCT: 44.1 % (ref 35.0–47.0)
Hemoglobin: 14.6 g/dL (ref 12.0–16.0)
MCH: 29.3 pg (ref 26.0–34.0)
MCHC: 33.2 g/dL (ref 32.0–36.0)
MCV: 88.3 fL (ref 80.0–100.0)
PLATELETS: 284 10*3/uL (ref 150–440)
RBC: 4.99 MIL/uL (ref 3.80–5.20)
RDW: 13.6 % (ref 11.5–14.5)
WBC: 11.3 10*3/uL — AB (ref 3.6–11.0)

## 2015-08-20 LAB — BASIC METABOLIC PANEL
Anion gap: 8 (ref 5–15)
BUN: 14 mg/dL (ref 6–20)
CALCIUM: 9.1 mg/dL (ref 8.9–10.3)
CHLORIDE: 106 mmol/L (ref 101–111)
CO2: 25 mmol/L (ref 22–32)
CREATININE: 0.64 mg/dL (ref 0.44–1.00)
GFR calc Af Amer: >60 mL/min (ref >60)
GFR calc non Af Amer: >60 mL/min (ref >60)
Glucose, Bld: 116 mg/dL — ABNORMAL HIGH (ref 65–99)
Potassium: 3.8 mmol/L (ref 3.5–5.1)
SODIUM: 139 mmol/L (ref 135–145)

## 2015-08-20 MED ORDER — ONDANSETRON HCL 4 MG/2ML IJ SOLN
4.0000 mg | Freq: Once | INTRAMUSCULAR | Status: AC
  Administered 2015-08-20: 4 mg via INTRAVENOUS
  Filled 2015-08-20: qty 2

## 2015-08-20 MED ORDER — SODIUM CHLORIDE 0.9 % IV BOLUS (SEPSIS)
1000.0000 mL | Freq: Once | INTRAVENOUS | Status: AC
  Administered 2015-08-20: 1000 mL via INTRAVENOUS

## 2015-08-20 MED ORDER — OXYCODONE-ACETAMINOPHEN 5-325 MG PO TABS: 1.0000 | ORAL_TABLET | ORAL | Status: DC | PRN

## 2015-08-20 MED ORDER — HYDROMORPHONE HCL 1 MG/ML IJ SOLN
0.5000 mg | Freq: Once | INTRAMUSCULAR | Status: AC
  Administered 2015-08-20: 0.5 mg via INTRAVENOUS
  Filled 2015-08-20: qty 1

## 2015-08-20 MED ORDER — TAMSULOSIN HCL 0.4 MG PO CAPS: 0.4000 mg | ORAL_CAPSULE | Freq: Every day | ORAL | Status: DC

## 2015-08-20 MED ORDER — IBUPROFEN 600 MG PO TABS: 600.0000 mg | ORAL_TABLET | Freq: Three times a day (TID) | ORAL | Status: DC | PRN

## 2015-08-20 MED ORDER — TAMSULOSIN HCL 0.4 MG PO CAPS
0.4000 mg | ORAL_CAPSULE | Freq: Once | ORAL | Status: AC
  Administered 2015-08-20: 0.4 mg via ORAL
  Filled 2015-08-20: qty 1

## 2015-08-20 MED ORDER — KETOROLAC TROMETHAMINE 30 MG/ML IJ SOLN
30.0000 mg | Freq: Once | INTRAMUSCULAR | Status: AC
Start: 2015-08-20 — End: 2015-08-20
  Administered 2015-08-20: 30 mg via INTRAVENOUS
  Filled 2015-08-20: qty 1

## 2015-08-20 MED ORDER — ONDANSETRON HCL 4 MG PO TABS: 4.0000 mg | ORAL_TABLET | Freq: Three times a day (TID) | ORAL | Status: DC | PRN

## 2015-08-20 NOTE — ED Provider Notes (Signed)
[facility] Emergency Department Provider Note  ____________________________________________  Time seen: Approximately 3:32 AM  I have reviewed the triage vital signs and the nursing notes.   HISTORY  Chief Complaint Flank Pain    HPI Janice Gray is a 34 y.o. female who presents to the ED from home with a chief complaint of right flank pain. Patient describes waxing/waning right flank pain which began acutely at 10 PM tonight. Pain radiates to right lower quadrant and is associated with nausea. Patient has a history of nephrolithiasis never requiring lithotripsy. Denies fever, chills, chest pain, shortness of breath, vomiting, diarrhea, dysuria, hematuria. Nothing makes the pain better or worse.   Past Medical History  Diagnosis Date  . Anxiety   . Depression   Nephrolithiasis  Patient Active Problem List   Diagnosis Date Noted  . Clinical depression 06/29/2015    Past Surgical History  Procedure Laterality Date  . Breast surgery    . Mandible fracture surgery      Current Outpatient Rx  Name  Route  Sig  Dispense  Refill  . ALPRAZolam (XANAX) 0.25 MG tablet   Oral   Take 1 tablet (0.25 mg total) by mouth 2 (two) times daily as needed for anxiety.   60 tablet   0   . buPROPion (WELLBUTRIN XL) 150 MG 24 hr tablet   Oral   Take 1 tablet (150 mg total) by mouth daily.   30 tablet   1   . etonogestrel (NEXPLANON) 68 MG IMPL implant   Intradermal   Inject into the skin.         Marland Kitchen FLUoxetine (PROZAC) 40 MG capsule   Oral   Take 2 capsules (80 mg total) by mouth 2 (two) times daily.   60 capsule   3   . QUEtiapine (SEROQUEL) 100 MG tablet   Oral   Take 1.5 tablets (150 mg total) by mouth at bedtime.   45 tablet   2     Allergies Review of patient's allergies indicates no known allergies.  Family History  Problem Relation Age of Onset  . Family history unknown: Yes    Social History Social History  Substance Use Topics   . Smoking status: Current Every Day Smoker -- 0.50 packs/day    Types: E-cigarettes, Cigarettes    Start date: 06/28/2000  . Smokeless tobacco: Never Used  . Alcohol Use: No     Comment: special occ./ social    Review of Systems Constitutional: No fever/chills Eyes: No visual changes. ENT: No sore throat. Cardiovascular: Denies chest pain. Respiratory: Denies shortness of breath. Gastrointestinal: No abdominal pain.  No nausea, no vomiting.  No diarrhea.  No constipation. Genitourinary: Negative for dysuria. Musculoskeletal: Positive for right flank pain. Skin: Negative for rash. Neurological: Negative for headaches, focal weakness or numbness.  10-point ROS otherwise negative.  ____________________________________________   PHYSICAL EXAM:  VITAL SIGNS: ED Triage Vitals  Enc Vitals Group     BP 08/20/15 0251 115/75 mmHg     Pulse Rate 08/20/15 0251 79     Resp 08/20/15 0251 20     Temp 08/20/15 0251 97.3 F (36.3 C)     Temp Source 08/20/15 0251 Oral     SpO2 08/20/15 0251 99 %     Weight 08/20/15 0251 140 lb (63.504 kg)     Height 08/20/15 0251 5\' 6"  (1.676 m)     Head Cir --      Peak Flow --  Pain Score 08/20/15 0252 8     Pain Loc --      Pain Edu? --      Excl. in GC? --     Constitutional: Alert and oriented. Well appearing and in moderate acute distress. Eyes: Conjunctivae are normal. PERRL. EOMI. Head: Atraumatic. Nose: No congestion/rhinnorhea. Mouth/Throat: Mucous membranes are moist.  Oropharynx non-erythematous. Neck: No stridor.   Cardiovascular: Normal rate, regular rhythm. Grossly normal heart sounds.  Good peripheral circulation. Respiratory: Normal respiratory effort.  No retractions. Lungs CTAB. Gastrointestinal: Soft and nontender. No distention. No abdominal bruits. Mild right CVA tenderness. Musculoskeletal: No lower extremity tenderness nor edema.  No joint effusions. Neurologic:  Normal speech and language. No gross focal  neurologic deficits are appreciated. No gait instability. Skin:  Skin is warm, dry and intact. No rash noted. Psychiatric: Mood and affect are normal. Speech and behavior are normal.  ____________________________________________   LABS (all labs ordered are listed, but only abnormal results are displayed)  Labs Reviewed  BASIC METABOLIC PANEL - Abnormal; Notable for the following:    Glucose, Bld 116 (*)    All other components within normal limits  CBC - Abnormal; Notable for the following:    WBC 11.3 (*)    All other components within normal limits  URINALYSIS COMPLETEWITH MICROSCOPIC ([facility] ONLY) - Abnormal; Notable for the following:    Color, Urine YELLOW (*)    APPearance CLOUDY (*)    Hgb urine dipstick 3+ (*)    Protein, ur 30 (*)    All other components within normal limits   ____________________________________________  EKG  none ____________________________________________  RADIOLOGY  none ____________________________________________   PROCEDURES  Procedure(s) performed: None  Critical Care performed: No  ____________________________________________   INITIAL IMPRESSION / ASSESSMENT AND PLAN / ED COURSE  Pertinent labs & imaging results that were available during my care of the patient were reviewed by me and considered in my medical decision making (see chart for details).  34 year old female with right flank pain, history of nephrolithiasis. Laboratory and urine results notable for too numerous to count red cells in her urine. Will initiate IV fluid resuscitation, IV analgesia and antiemetic. Will reassess.  ----------------------------------------- 5:12 AM on 08/20/2015 -----------------------------------------  Patient asleep. Awakened to reassess her pain and she states pain is returning. Will order IV Toradol and initiate Flomax.  ----------------------------------------- 5:51 AM on  08/20/2015 -----------------------------------------  Patient is feeling much better and ready for discharge. Strict return precautions given. Patient verbalizes understanding and agrees with plan of care. ____________________________________________   FINAL CLINICAL IMPRESSION(S) / ED DIAGNOSES  Final diagnoses:  Renal colic on right side      Irean Hong, MD 08/20/15 [phone]

## 2015-08-20 NOTE — ED Notes (Signed)
Pt has right flank pain.  Began at 2200 tonight.  pt reports nausea.  Hx of kidney stones

## 2015-08-20 NOTE — Discharge Instructions (Signed)
1. Take pain & nausea medicines as needed (Percocet/Zofran #30). Make sure to take a stool softener while taking narcotic pain medicines. °2. Take Flomax 0.4mg daily x 14 days. °3. Drink plenty of bottled or filtered water daily. °4. Return to the ER for worsening symptoms, persistent vomiting, fever, difficulty breathing or other concerns. ° ° ° °Kidney Stones °Kidney stones (urolithiasis) are deposits that form inside your kidneys. The intense pain is caused by the stone moving through the urinary tract. When the stone moves, the ureter goes into spasm around the stone. The stone is usually passed in the urine.  °CAUSES  °· A disorder that makes certain neck glands produce too much parathyroid hormone (primary hyperparathyroidism). °· A buildup of uric acid crystals, similar to gout in your joints. °· Narrowing (stricture) of the ureter. °· A kidney obstruction present at birth (congenital obstruction). °· Previous surgery on the kidney or ureters. °· Numerous kidney infections. °SYMPTOMS  °· Feeling sick to your stomach (nauseous). °· Throwing up (vomiting). °· Blood in the urine (hematuria). °· Pain that usually spreads (radiates) to the groin. °· Frequency or urgency of urination. °DIAGNOSIS  °· Taking a history and physical exam. °· Blood or urine tests. °· CT scan. °· Occasionally, an examination of the inside of the urinary bladder (cystoscopy) is performed. °TREATMENT  °· Observation. °· Increasing your fluid intake. °· Extracorporeal shock wave lithotripsy--This is a noninvasive procedure that uses shock waves to break up kidney stones. °· Surgery may be needed if you have severe pain or persistent obstruction. There are various surgical procedures. Most of the procedures are performed with the use of small instruments. Only small incisions are needed to accommodate these instruments, so recovery time is minimized. °The size, location, and chemical composition are all important variables that will  determine the proper choice of action for you. Talk to your health care provider to better understand your situation so that you will minimize the risk of injury to yourself and your kidney.  °HOME CARE INSTRUCTIONS  °· Drink enough water and fluids to keep your urine clear or pale yellow. This will help you to pass the stone or stone fragments. °· Strain all urine through the provided strainer. Keep all particulate matter and stones for your health care provider to see. The stone causing the pain may be as small as a grain of salt. It is very important to use the strainer each and every time you pass your urine. The collection of your stone will allow your health care provider to analyze it and verify that a stone has actually passed. The stone analysis will often identify what you can do to reduce the incidence of recurrences. °· Only take over-the-counter or prescription medicines for pain, discomfort, or fever as directed by your health care provider. °· Make a follow-up appointment with your health care provider as directed. °· Get follow-up X-rays if required. The absence of pain does not always mean that the stone has passed. It may have only stopped moving. If the urine remains completely obstructed, it can cause loss of kidney function or even complete destruction of the kidney. It is your responsibility to make sure X-rays and follow-ups are completed. Ultrasounds of the kidney can show blockages and the status of the kidney. Ultrasounds are not associated with any radiation and can be performed easily in a matter of minutes. °SEEK MEDICAL CARE IF: °· You experience pain that is progressive and unresponsive to any pain medicine you have been   prescribed. °SEEK IMMEDIATE MEDICAL CARE IF:  °· Pain cannot be controlled with the prescribed medicine. °· You have a fever or shaking chills. °· The severity or intensity of pain increases over 18 hours and is not relieved by pain medicine. °· You develop a new onset  of abdominal pain. °· You feel faint or pass out. °· You are unable to urinate. °MAKE SURE YOU:  °· Understand these instructions. °· Will watch your condition. °· Will get help right away if you are not doing well or get worse. °Document Released: 12/05/2005 Document Revised: 08/07/2013 Document Reviewed: 05/08/2013 °[employer]® Patient Information ©2015 [employer]. This information is not intended to replace advice given to you by your health care provider. Make sure you discuss any questions you have with your health care provider. ° °

## 2015-08-21 ENCOUNTER — Emergency Department: Payer: BLUE CROSS/BLUE SHIELD

## 2015-08-21 ENCOUNTER — Emergency Department
Admission: EM | Admit: 2015-08-21 | Discharge: 2015-08-21 | Disposition: A | Discharge status: Home / Self Care | Payer: BLUE CROSS/BLUE SHIELD | Attending: Emergency Medicine | Admitting: Emergency Medicine

## 2015-08-21 ENCOUNTER — Encounter: Payer: Self-pay | Admitting: Medical Oncology

## 2015-08-21 DIAGNOSIS — N201 Calculus of ureter: Secondary | ICD-10-CM | POA: Diagnosis not present

## 2015-08-21 DIAGNOSIS — K5900 Constipation, unspecified: Secondary | ICD-10-CM | POA: Diagnosis not present

## 2015-08-21 DIAGNOSIS — Z720 Tobacco use: Secondary | ICD-10-CM | POA: Insufficient documentation

## 2015-08-21 DIAGNOSIS — Z79899 Other long term (current) drug therapy: Secondary | ICD-10-CM | POA: Diagnosis not present

## 2015-08-21 DIAGNOSIS — Z3202 Encounter for pregnancy test, result negative: Secondary | ICD-10-CM | POA: Diagnosis not present

## 2015-08-21 DIAGNOSIS — R109 Unspecified abdominal pain: Secondary | ICD-10-CM

## 2015-08-21 LAB — CBC
HEMATOCRIT: 42.1 % (ref 35.0–47.0)
HEMOGLOBIN: 13.8 g/dL (ref 12.0–16.0)
MCH: 29.5 pg (ref 26.0–34.0)
MCHC: 32.8 g/dL (ref 32.0–36.0)
MCV: 90 fL (ref 80.0–100.0)
Platelets: 253 10*3/uL (ref 150–440)
RBC: 4.68 MIL/uL (ref 3.80–5.20)
RDW: 13.7 % (ref 11.5–14.5)
WBC: 14.3 10*3/uL — AB (ref 3.6–11.0)

## 2015-08-21 LAB — URINALYSIS COMPLETE WITH MICROSCOPIC (ARMC ONLY)
Bilirubin Urine: NEGATIVE
GLUCOSE, UA: NEGATIVE mg/dL
NITRITE: NEGATIVE
Protein, ur: NEGATIVE mg/dL
SPECIFIC GRAVITY, URINE: 1.025 (ref 1.005–1.030)
pH: 5 (ref 5.0–8.0)

## 2015-08-21 LAB — PREGNANCY, URINE: Preg Test, Ur: NEGATIVE

## 2015-08-21 MED ORDER — SODIUM CHLORIDE 0.9 % IV BOLUS (SEPSIS)
1000.0000 mL | INTRAVENOUS | Status: AC
  Administered 2015-08-21: 1000 mL via INTRAVENOUS

## 2015-08-21 MED ORDER — ONDANSETRON HCL 4 MG/2ML IJ SOLN
4.0000 mg | Freq: Once | INTRAMUSCULAR | Status: AC
  Administered 2015-08-21: 4 mg via INTRAVENOUS
  Filled 2015-08-21: qty 2

## 2015-08-21 MED ORDER — MORPHINE SULFATE (PF) 4 MG/ML IV SOLN
4.0000 mg | Freq: Once | INTRAVENOUS | Status: AC
  Administered 2015-08-21: 4 mg via INTRAVENOUS
  Filled 2015-08-21: qty 1

## 2015-08-21 MED ORDER — KETOROLAC TROMETHAMINE 30 MG/ML IJ SOLN
30.0000 mg | Freq: Once | INTRAMUSCULAR | Status: AC
  Administered 2015-08-21: 30 mg via INTRAVENOUS

## 2015-08-21 MED ORDER — KETOROLAC TROMETHAMINE 30 MG/ML IJ SOLN
INTRAMUSCULAR | Status: AC
  Filled 2015-08-21: qty 1

## 2015-08-21 NOTE — Discharge Instructions (Signed)
As we discussed, you do have a small kidney stone, but you currently have no sign of infection and the stone is small enough that it should pass on its own.  We recommend he continue taking her medications and drink plenty of fluids and follow-up at the next available appointment early this coming week.  If he develop any new or worsening symptoms that concern you, including but not limited to fever, persistent vomiting, or worsening pain, please return immediately to the emergency department.  Also, if you are suffering from constipation as a result of the pain medicine you are taking, consider some of the following recommendations: 1)  Colace (or Dulcolax) 100 mg:  This is a stool softener, and you may take it once or twice a day as needed. 2)  Senna tablets:  This is a bowel stimulant that will help "push" out your stool. It is the next step to add after you have tried a stool softener. 3)  Miralax (powder):  This medication works by drawing additional fluid into your intestines and helps to flush out your stool.  Mix the powder with water or juice according to label instructions.  It may help if the Colace and Senna are not sufficient, but you must be sure to use the recommended amount of water or juice when you mix up the powder. Remember that narcotic pain medications are constipating, so avoid them or minimize their use.  Drink plenty of fluids.   Kidney Stones Kidney stones (urolithiasis) are deposits that form inside your kidneys. The intense pain is caused by the stone moving through the urinary tract. When the stone moves, the ureter goes into spasm around the stone. The stone is usually passed in the urine.  CAUSES   A disorder that makes certain neck glands produce too much parathyroid hormone (primary hyperparathyroidism).  A buildup of uric acid crystals, similar to gout in your joints.  Narrowing (stricture) of the ureter.  A kidney obstruction present at birth (congenital  obstruction).  Previous surgery on the kidney or ureters.  Numerous kidney infections. SYMPTOMS   Feeling sick to your stomach (nauseous).  Throwing up (vomiting).  Blood in the urine (hematuria).  Pain that usually spreads (radiates) to the groin.  Frequency or urgency of urination. DIAGNOSIS   Taking a history and physical exam.  Blood or urine tests.  CT scan.  Occasionally, an examination of the inside of the urinary bladder (cystoscopy) is performed. TREATMENT   Observation.  Increasing your fluid intake.  Extracorporeal shock wave lithotripsy--This is a noninvasive procedure that uses shock waves to break up kidney stones.  Surgery may be needed if you have severe pain or persistent obstruction. There are various surgical procedures. Most of the procedures are performed with the use of small instruments. Only small incisions are needed to accommodate these instruments, so recovery time is minimized. The size, location, and chemical composition are all important variables that will determine the proper choice of action for you. Talk to your health care provider to better understand your situation so that you will minimize the risk of injury to yourself and your kidney.  HOME CARE INSTRUCTIONS   Drink enough water and fluids to keep your urine clear or pale yellow. This will help you to pass the stone or stone fragments.  Strain all urine through the provided strainer. Keep all particulate matter and stones for your health care provider to see. The stone causing the pain may be as small as a grain  of salt. It is very important to use the strainer each and every time you pass your urine. The collection of your stone will allow your health care provider to analyze it and verify that a stone has actually passed. The stone analysis will often identify what you can do to reduce the incidence of recurrences.  Only take over-the-counter or prescription medicines for pain,  discomfort, or fever as directed by your health care provider.  Make a follow-up appointment with your health care provider as directed.  Get follow-up X-rays if required. The absence of pain does not always mean that the stone has passed. It may have only stopped moving. If the urine remains completely obstructed, it can cause loss of kidney function or even complete destruction of the kidney. It is your responsibility to make sure X-rays and follow-ups are completed. Ultrasounds of the kidney can show blockages and the status of the kidney. Ultrasounds are not associated with any radiation and can be performed easily in a matter of minutes. SEEK MEDICAL CARE IF:  You experience pain that is progressive and unresponsive to any pain medicine you have been prescribed. SEEK IMMEDIATE MEDICAL CARE IF:   Pain cannot be controlled with the prescribed medicine.  You have a fever or shaking chills.  The severity or intensity of pain increases over 18 hours and is not relieved by pain medicine.  You develop a new onset of abdominal pain.  You feel faint or pass out.  You are unable to urinate. MAKE SURE YOU:   Understand these instructions.  Will watch your condition.  Will get help right away if you are not doing well or get worse. Document Released: 12/05/2005 Document Revised: 08/07/2013 Document Reviewed: 05/08/2013 [facility] Patient Information 2015 [place], [place]. This information is not intended to replace advice given to you by your health care provider. Make sure you discuss any questions you have with your health care provider.

## 2015-08-21 NOTE — ED Notes (Signed)
Pt began having rt sided flank pain Wednesday night, was seen here yesterday and diagnosed with kidney stone and d/c'd. Pt reports pain meds not helping.

## 2015-08-21 NOTE — ED Provider Notes (Signed)
[facility] Emergency Department Provider Note  ____________________________________________  Time seen: Approximately 4:59 PM  I have reviewed the triage vital signs and the nursing notes.   HISTORY  Chief Complaint Flank Pain    HPI Janice Gray is a 34 y.o. female with a history of kidney stones, anxiety, and major depressive disorder who presents with persistent right flank pain.  She was seen approximately 2 days ago in the emergency department after acute onset of severe right flank pain and hematuria.  Given her history and lack of white blood cells in her urine and well-controlled pain at the time, no imaging was obtained.  However, since she has gone home the pain has been persistent and at times is worse than it was before.  She states that her Percocet helped for about an hour but then it comes back stronger than ever.  She has had persistent nausea and vomiting as well.  She has had subjective fever and chills although she is afebrile in the emergency department.  The pain is sharp, stabbing, and is worsened with movement and motion such as the car ride to the emergency department.  She has no anterior abdominal pain, no shortness of breath, no chest pain.   Past Medical History  Diagnosis Date  . Anxiety   . Depression     Patient Active Problem List   Diagnosis Date Noted  . Clinical depression 06/29/2015    Past Surgical History  Procedure Laterality Date  . Breast surgery    . Mandible fracture surgery      Current Outpatient Rx  Name  Route  Sig  Dispense  Refill  . ALPRAZolam (XANAX) 0.25 MG tablet   Oral   Take 1 tablet (0.25 mg total) by mouth 2 (two) times daily as needed for anxiety.   60 tablet   0   . buPROPion (WELLBUTRIN XL) 150 MG 24 hr tablet   Oral   Take 1 tablet (150 mg total) by mouth daily.   30 tablet   1   . etonogestrel (NEXPLANON) 68 MG IMPL implant   Intradermal   Inject into the skin.         Marland Kitchen  FLUoxetine (PROZAC) 40 MG capsule   Oral   Take 2 capsules (80 mg total) by mouth 2 (two) times daily.   60 capsule   3   . ibuprofen (ADVIL,MOTRIN) 600 MG tablet   Oral   Take 1 tablet (600 mg total) by mouth every 8 (eight) hours as needed.   15 tablet   0   . ondansetron (ZOFRAN) 4 MG tablet   Oral   Take 1 tablet (4 mg total) by mouth every 8 (eight) hours as needed for nausea or vomiting.   30 tablet   0   . oxyCODONE-acetaminophen (ROXICET) 5-325 MG per tablet   Oral   Take 1-2 tablets by mouth every 4 (four) hours as needed for severe pain.   30 tablet   0   . QUEtiapine (SEROQUEL) 100 MG tablet   Oral   Take 1.5 tablets (150 mg total) by mouth at bedtime.   45 tablet   2   . tamsulosin (FLOMAX) 0.4 MG CAPS capsule   Oral   Take 1 capsule (0.4 mg total) by mouth daily.   14 capsule   0     Allergies Review of patient's allergies indicates no known allergies.  Family History  Problem Relation Age of Onset  . Family  history unknown: Yes    Social History Social History  Substance Use Topics  . Smoking status: Current Every Day Smoker -- 0.50 packs/day    Types: E-cigarettes, Cigarettes    Start date: 06/28/2000  . Smokeless tobacco: Never Used  . Alcohol Use: No     Comment: special occ./ social    Review of Systems Constitutional: Subjective fever and chills when the pain is severe Eyes: No visual changes. ENT: No sore throat. Cardiovascular: Denies chest pain. Respiratory: Denies shortness of breath. Gastrointestinal: No abdominal pain.  Intermittent episodic vomiting.  No diarrhea.  No constipation. Genitourinary: Negative for dysuria. Musculoskeletal: Right flank pain Skin: Negative for rash. Neurological: Negative for headaches, focal weakness or numbness.  10-point ROS otherwise negative.  ____________________________________________   PHYSICAL EXAM:  VITAL SIGNS: ED Triage Vitals  Enc Vitals Group     BP 08/21/15 1539 123/72  mmHg     Pulse Rate 08/21/15 1539 102     Resp 08/21/15 1539 18     Temp 08/21/15 1539 98.4 F (36.9 C)     Temp Source 08/21/15 1539 Oral     SpO2 08/21/15 1539 100 %     Weight 08/21/15 1539 140 lb (63.504 kg)     Height 08/21/15 1539 5\' 6"  (1.676 m)     Head Cir --      Peak Flow --      Pain Score 08/21/15 1540 7     Pain Loc --      Pain Edu? --      Excl. in GC? --     Constitutional: Alert and oriented.  Appears to be in pain Eyes: Conjunctivae are normal. PERRL. EOMI. Head: Atraumatic. Nose: No congestion/rhinnorhea. Mouth/Throat: Mucous membranes are moist.  Oropharynx non-erythematous. Neck: No stridor.   Cardiovascular: Normal rate, regular rhythm. Grossly normal heart sounds.  Good peripheral circulation. Respiratory: Normal respiratory effort.  No retractions. Lungs CTAB. Gastrointestinal: Soft and nontender. No distention. No abdominal bruits.  Severe right CVA tenderness. Musculoskeletal: No lower extremity tenderness nor edema.  No joint effusions. Neurologic:  Normal speech and language. No gross focal neurologic deficits are appreciated.  Skin:  Skin is warm, dry and intact. No rash noted. Psychiatric: Mood and affect are normal. Speech and behavior are normal.  ____________________________________________   LABS (all labs ordered are listed, but only abnormal results are displayed)  Labs Reviewed  URINALYSIS COMPLETEWITH MICROSCOPIC ([facility] ONLY) - Abnormal; Notable for the following:    Color, Urine YELLOW (*)    APPearance HAZY (*)    Ketones, ur 1+ (*)    Hgb urine dipstick 3+ (*)    Leukocytes, UA TRACE (*)    Bacteria, UA FEW (*)    Squamous Epithelial / LPF 6-30 (*)    All other components within normal limits  CBC - Abnormal; Notable for the following:    WBC 14.3 (*)    All other components within normal limits  PREGNANCY, URINE   ____________________________________________  EKG  Not  indicated ____________________________________________  RADIOLOGY   Ct Renal Stone Study  08/21/2015   CLINICAL DATA:  Right flank pain starting Wednesday night. Recent diagnosis kidney stones. Gross hematuria.  EXAM: CT ABDOMEN AND PELVIS WITHOUT CONTRAST  TECHNIQUE: Multidetector CT imaging of the abdomen and pelvis was performed following the standard protocol without IV contrast.  COMPARISON:  06/16/2013  FINDINGS: Lower chest: Dependent subsegmental atelectasis in both lower lobes. Bilateral breast implants.  Hepatobiliary: Unremarkable  Pancreas: Unremarkable  Spleen: Unremarkable  Adrenals/Urinary Tract: Moderate right hydronephrosis and considerable right hydroureter extending down to a 2 mm right UVJ stone. Asymmetric right perirenal and periureteral stranding. No other stones observed.  Stomach/Bowel: Prominent stool throughout the colon favors constipation. Appendix normal.  Vascular/Lymphatic: Unremarkable  Reproductive: Unremarkable  Other: No supplemental non-categorized findings.  Musculoskeletal: Unremarkable  IMPRESSION: 1. Moderate right hydronephrosis and considerable right hydroureter with perirenal and periureteral stranding associated with a 2 mm right UVJ stone. No other stones seen. Appendix normal. 2.  Prominent stool throughout the colon favors constipation.   Electronically Signed   By: Gaylyn Rong M.D.   On: 08/21/2015 18:04    ____________________________________________   PROCEDURES  Procedure(s) performed: None  Critical Care performed: No ____________________________________________   INITIAL IMPRESSION / ASSESSMENT AND PLAN / ED COURSE  Pertinent labs & imaging results that were available during my care of the patient were reviewed by me and considered in my medical decision making (see chart for details).  Because this is a repeat visit with worsening pain, I will obtain a CT renal stone protocol at this time.  Her urine continues to have blood but no  evidence of infection so I will hold off on empiric antibiotics.  I am giving her normal saline bolus, morphine, Zofran, and we will reassess after the imaging.   (Note that documentation was delayed due to multiple ED patients requiring immediate care.)   The patient's pain is well controlled now.  She has hydronephrosis in ureter or nephrosis on the right due to 2 mm stone.  I spoke I phoned with the on-call urologist, Dr. Marlou Porch, who looked at CT scan and felt like the stone was just about to pass.  He felt that the patient is appropriate for outpatient follow-up next week.  I gave her my usual and customary return precautions.  ____________________________________________  FINAL CLINICAL IMPRESSION(S) / ED DIAGNOSES  Final diagnoses:  Right flank pain  Ureterolithiasis  Constipation, unspecified constipation type      NEW MEDICATIONS STARTED DURING THIS VISIT:  Discharge Medication List as of 08/21/2015  7:47 PM       Loleta Rose, MD 08/21/15 2326

## 2015-08-27 ENCOUNTER — Encounter: Payer: Self-pay | Admitting: Urology

## 2015-08-27 ENCOUNTER — Ambulatory Visit (INDEPENDENT_AMBULATORY_CARE_PROVIDER_SITE_OTHER): Payer: BLUE CROSS/BLUE SHIELD | Admitting: Urology

## 2015-08-27 VITALS — BP 96/66 | HR 101 | Ht 66.0 in | Wt 139.7 lb

## 2015-08-27 DIAGNOSIS — N132 Hydronephrosis with renal and ureteral calculous obstruction: Secondary | ICD-10-CM | POA: Diagnosis not present

## 2015-08-27 DIAGNOSIS — R3129 Other microscopic hematuria: Secondary | ICD-10-CM

## 2015-08-27 DIAGNOSIS — N201 Calculus of ureter: Secondary | ICD-10-CM

## 2015-08-27 DIAGNOSIS — R312 Other microscopic hematuria: Secondary | ICD-10-CM | POA: Diagnosis not present

## 2015-08-27 DIAGNOSIS — Z87442 Personal history of urinary calculi: Secondary | ICD-10-CM

## 2015-08-27 LAB — URINALYSIS, COMPLETE
BILIRUBIN UA: NEGATIVE
Glucose, UA: NEGATIVE
KETONES UA: NEGATIVE
Leukocytes, UA: NEGATIVE
NITRITE UA: NEGATIVE
Protein, UA: NEGATIVE
SPEC GRAV UA: 1.01 (ref 1.005–1.030)
UUROB: 0.2 mg/dL (ref 0.2–1.0)
pH, UA: 6.5 (ref 5.0–7.5)

## 2015-08-27 LAB — MICROSCOPIC EXAMINATION
Bacteria, UA: NONE SEEN
WBC UA: NONE SEEN /HPF (ref 0–?)

## 2015-08-27 NOTE — Progress Notes (Signed)
08/27/2015 3:46 PM   Janice Gray Dec 18, 1981 [id number]  Referring provider: No referring provider defined for this encounter.  Chief Complaint  Patient presents with  . Nephrolithiasis    ER Referral    HPI: Patient is a 34 year old white female with a history of nephrolithiasis who presents today after being seen in the emergency room on 2 occasions for a right UVJ stone causing moderate right hydronephrosis and considerable right hydroureter with perirenal and periureteral stranding.  She states her symptoms started 1 week ago with the sudden onset of right-sided flank pain that awoke her. She was also cold and clammy. She sought care in [facility] ED and her pain was controlled and she was discharged to home. The pain then returned as intensely as before and during her second visit to the ED they obtained a CT scan that demonstrated the 2 mm right UVJ stone.  Today, she is not experiencing any discomfort. She states that her last intense pain was 4 days ago and she felt uncomfortable 2 days ago. She feels she may have passed stone. She was not given a strainer and therefore was not straining her urine.  She has passed her other stone spontaneously and has not needed surgical intervention. Her stone composition is unknown at this time.  Her baseline urinary symptoms are nocturia and leakage of urine. These are not bothersome to her at this time.  Her UA still contains 3-10 RBC's per high-power field on today's exam.  I have reviewed the CT scan with the patient.  PMH: Past Medical History  Diagnosis Date  . Anxiety   . Depression   . History of kidney stones     Surgical History: Past Surgical History  Procedure Laterality Date  . Breast surgery      Implants  . Mandible fracture surgery    . Wisdom teeth surgery      Home Medications:    Medication List       This list is accurate as of: 08/27/15  3:46 PM.  Always use your most recent med list.               ALPRAZolam 0.25 MG tablet  Commonly known as:  XANAX  Take 1 tablet (0.25 mg total) by mouth 2 (two) times daily as needed for anxiety.     buPROPion 150 MG 24 hr tablet  Commonly known as:  WELLBUTRIN XL  Take 1 tablet (150 mg total) by mouth daily.     cetirizine 10 MG tablet  Commonly known as:  ZYRTEC  Take 10 mg by mouth daily.     DEPO-PROVERA 150 MG/ML injection  Generic drug:  medroxyPROGESTERone  Inject into the muscle.     etonogestrel 68 MG Impl implant  Commonly known as:  NEXPLANON  Inject into the skin.     FLUoxetine 40 MG capsule  Commonly known as:  PROZAC  Take 2 capsules (80 mg total) by mouth 2 (two) times daily.     fluticasone 50 MCG/ACT nasal spray  Commonly known as:  FLONASE  Place 2 sprays into both nostrils daily.     ibuprofen 600 MG tablet  Commonly known as:  ADVIL,MOTRIN  Take 1 tablet (600 mg total) by mouth every 8 (eight) hours as needed.     MULTIVITAMIN GUMMIES ADULT Chew  Chew by mouth.     ondansetron 4 MG tablet  Commonly known as:  ZOFRAN  Take 1 tablet (4 mg total) by mouth every 8 (  eight) hours as needed for nausea or vomiting.     oxyCODONE-acetaminophen 5-325 MG per tablet  Commonly known as:  ROXICET  Take 1-2 tablets by mouth every 4 (four) hours as needed for severe pain.     QUEtiapine 100 MG tablet  Commonly known as:  SEROQUEL  Take 1.5 tablets (150 mg total) by mouth at bedtime.     tamsulosin 0.4 MG Caps capsule  Commonly known as:  FLOMAX  Take 1 capsule (0.4 mg total) by mouth daily.        Allergies: No Known Allergies  Family History: Family History  Problem Relation Age of Onset  . Kidney disease Neg Hx   . Bladder Cancer Neg Hx     Social History:  reports that she has been smoking E-cigarettes and Cigarettes.  She started smoking about 15 years ago. She has been smoking about 0.50 packs per day. She has never used smokeless tobacco. She reports that she drinks alcohol. She reports that she does  not use illicit drugs.  ROS: UROLOGY Frequent Urination?: No Hard to postpone urination?: No Burning/pain with urination?: No Get up at night to urinate?: Yes Leakage of urine?: Yes Urine stream starts and stops?: No Trouble starting stream?: No Do you have to strain to urinate?: No Blood in urine?: No Urinary tract infection?: No Sexually transmitted disease?: No Injury to kidneys or bladder?: No Painful intercourse?: No Weak stream?: No Currently pregnant?: No Vaginal bleeding?: No Last menstrual period?: n  Gastrointestinal Nausea?: No Vomiting?: No Indigestion/heartburn?: No Diarrhea?: No Constipation?: No  Constitutional Fever: No Night sweats?: Yes Weight loss?: No Fatigue?: No  Skin Skin rash/lesions?: No Itching?: No  Eyes Blurred vision?: No Double vision?: No  Ears/Nose/Throat Sore throat?: No Sinus problems?: Yes  Hematologic/Lymphatic Swollen glands?: No Easy bruising?: No  Cardiovascular Leg swelling?: No Chest pain?: No  Respiratory Cough?: No Shortness of breath?: No  Endocrine Excessive thirst?: No  Musculoskeletal Back pain?: Yes Joint pain?: No  Neurological Headaches?: No Dizziness?: No  Psychologic Depression?: Yes Anxiety?: Yes  Physical Exam: BP 96/66 mmHg  Pulse 101  Ht 5\' 6"  (1.676 m)  Wt 139 lb 11.2 oz (63.368 kg)  BMI 22.56 kg/m2  Constitutional:  Alert and oriented, No acute distress. HEENT: [place] AT, moist mucus membranes.  Trachea midline, no masses. Cardiovascular: No clubbing, cyanosis, or edema. Respiratory: Normal respiratory effort, no increased work of breathing. GI: Abdomen is soft, nontender, nondistended, no abdominal masses GU: No CVA tenderness.  Skin: No rashes, bruises or suspicious lesions. Lymph: No cervical or inguinal adenopathy. Neurologic: Grossly intact, no focal deficits, moving all 4 extremities. Psychiatric: Normal mood and affect.  Laboratory Data: Lab Results  Component  Value Date   WBC 14.3* 08/21/2015   HGB 13.8 08/21/2015   HCT 42.1 08/21/2015   MCV 90.0 08/21/2015   PLT 253 08/21/2015    Lab Results  Component Value Date   CREATININE 0.64 08/20/2015  Urinalysis Results for orders placed or performed in visit on 08/27/15  Microscopic Examination  Result Value Ref Range   WBC, UA None seen 0 -  5 /hpf   RBC, UA 3-10 (A) 0 -  2 /hpf   Epithelial Cells (non renal) 0-10 0 - 10 /hpf   Mucus, UA Present (A) Not Estab.   Bacteria, UA None seen None seen/Few  Urinalysis, Complete  Result Value Ref Range   Specific Gravity, UA 1.010 1.005 - 1.030   pH, UA 6.5 5.0 - 7.5  Color, UA Yellow Yellow   Appearance Ur Clear Clear   Leukocytes, UA Negative Negative   Protein, UA Negative Negative/Trace   Glucose, UA Negative Negative   Ketones, UA Negative Negative   RBC, UA 2+ (A) Negative   Bilirubin, UA Negative Negative   Urobilinogen, Ur 0.2 0.2 - 1.0 mg/dL   Nitrite, UA Negative Negative   Microscopic Examination See below:    Pertinent Imaging: CLINICAL DATA: Right flank pain starting Wednesday night. Recent diagnosis kidney stones. Gross hematuria.  EXAM: CT ABDOMEN AND PELVIS WITHOUT CONTRAST  TECHNIQUE: Multidetector CT imaging of the abdomen and pelvis was performed following the standard protocol without IV contrast.  COMPARISON: 06/16/2013  FINDINGS: Lower chest: Dependent subsegmental atelectasis in both lower lobes. Bilateral breast implants.  Hepatobiliary: Unremarkable  Pancreas: Unremarkable  Spleen: Unremarkable  Adrenals/Urinary Tract: Moderate right hydronephrosis and considerable right hydroureter extending down to a 2 mm right UVJ stone. Asymmetric right perirenal and periureteral stranding. No other stones observed.  Stomach/Bowel: Prominent stool throughout the colon favors constipation. Appendix normal.  Vascular/Lymphatic: Unremarkable  Reproductive: Unremarkable  Other: No supplemental  non-categorized findings.  Musculoskeletal: Unremarkable  IMPRESSION: 1. Moderate right hydronephrosis and considerable right hydroureter with perirenal and periureteral stranding associated with a 2 mm right UVJ stone. No other stones seen. Appendix normal. 2. Prominent stool throughout the colon favors constipation.   Electronically Signed  By: Gaylyn Rong M.D.  On: 08/21/2015 18:04   Assessment & Plan:    1. Ureteral stone:   Patient was found to have a 2 mm right UVJ stone on CT scan completed 08/21/2015. She has not had any recent symptomatology and believe she may have passed stone. She still has red blood cells in her urine today. She is to continue the tamsulosin and I've given her strainer to strain her urine. She states she has pain medication on hand in case she should experience severe flank pain in the future.  She is instructed to seek treatment in the ED if she should develop fevers, intractable pain or vomiting.  - Urinalysis, Complete  2. Hydronephrosis:   Patient believes she may have passed her stone. We'll obtain a renal ultrasound in 1 month to ensure the hydronephrosis and hydroureter have resolved.  3. Microscopic hematuria:   Patient has 3-10 RBC's on today's urinalysis. This is in the presence of a possible recent passage of a stone. We will continue to monitor. Patient return in 1 month time for a follow-up UA.  4. History of nephrolithiasis:   Patient would like to undergo a 24-hour urine once it is confirmed her hydronephrosis has resolved.   No Follow-up on file.  Michiel Cowboy, PA-C  [facility] [address], [address] [place], [place] [address] [phone]

## 2015-08-28 ENCOUNTER — Ambulatory Visit (INDEPENDENT_AMBULATORY_CARE_PROVIDER_SITE_OTHER): Payer: BLUE CROSS/BLUE SHIELD | Admitting: Psychiatry

## 2015-08-28 ENCOUNTER — Encounter: Payer: Self-pay | Admitting: Psychiatry

## 2015-08-28 VITALS — BP 112/74 | HR 100 | Temp 98.1°F | Ht 66.0 in | Wt 141.6 lb

## 2015-08-28 DIAGNOSIS — F4010 Social phobia, unspecified: Secondary | ICD-10-CM | POA: Diagnosis not present

## 2015-08-28 DIAGNOSIS — F331 Major depressive disorder, recurrent, moderate: Secondary | ICD-10-CM | POA: Diagnosis not present

## 2015-08-28 DIAGNOSIS — K589 Irritable bowel syndrome without diarrhea: Secondary | ICD-10-CM | POA: Insufficient documentation

## 2015-08-28 MED ORDER — BUPROPION HCL ER (SR) 100 MG PO TB12: 100.0000 mg | ORAL_TABLET | Freq: Two times a day (BID) | ORAL | Status: DC

## 2015-08-28 MED ORDER — ALPRAZOLAM 0.5 MG PO TABS: 0.2500 mg | ORAL_TABLET | Freq: Two times a day (BID) | ORAL | Status: DC | PRN

## 2015-08-28 NOTE — Progress Notes (Signed)
BH MD/PA/NP OP Progress Note  08/28/2015 1:58 PM Janice Gray  MRN:  [id number]   Subjective: Patient presents for follow-up of her major depressive disorder and social anxiety disorder. She came off the Prozac and started Wellbutrin XL 150 mg. She states she has not noticed any changes in her mood. She states she still desires more energy, motivation and more interest in things. She states that in the past she was on the SR form of Wellbutrin. She states she does recall that she might have had more energy with SR form.  She states that she's sleeping fairly well. She states she's really not enjoying things very much. She states that they had a beach trip but she had significant anxiety better kids being in the water and that she was not able to enjoy the trip. She states she did take the alprazolam at least once a day while on the beach trip but states she did not notice any benefit from the medication which was at a low dose of 0.25 mg. In regards to anxiety she does worry about the transition is going to happen at work. 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   Chief Complaint: do not notice anything Chief Complaint    Anxiety; Stress; Follow-up; Medication Refill; Panic Attack     Visit Diagnosis:     ICD-9-CM ICD-10-CM   1. Major depressive disorder, recurrent episode, moderate 296.32 F33.1   2. Social anxiety disorder 300.23 F40.10     Past Medical History:  Past Medical History  Diagnosis Date  . Anxiety   . Depression   . History of kidney stones     Past Surgical History  Procedure Laterality Date  . Breast surgery      Implants  . Mandible fracture surgery    . Wisdom teeth surgery     Family History:  Family History  Problem Relation Age of Onset  . Kidney disease Neg Hx   . Bladder Cancer Neg Hx    Social History:  Social History   Social History  . Marital Status: Single   Spouse Name: N/A  . Number of Children: N/A  . Years of Education: N/A   Social History Main Topics  . Smoking status: Current Every Day Smoker -- 0.50 packs/day    Types: E-cigarettes, Cigarettes    Start date: 06/28/2000  . Smokeless tobacco: Never Used  . Alcohol Use: 0.0 oz/week    0 Standard drinks or equivalent per week     Comment: special occ./ social  . Drug Use: No  . Sexual Activity: Yes    Birth Control/ Protection: None   Other Topics Concern  . None   Social History Narrative   Additional History:   Assessment:   Musculoskeletal: Strength & Muscle Tone: within normal limits Gait & Station: normal Patient leans: N/A  Psychiatric Specialty Exam: Anxiety Symptoms include nervous/anxious behavior. Patient reports no insomnia or suicidal ideas.    Depression        Associated symptoms include does not have insomnia and no suicidal ideas.  Past medical history includes anxiety.     Review of Systems  Psychiatric/Behavioral: Positive for depression. Negative for suicidal ideas, hallucinations, memory loss and substance abuse. The patient is nervous/anxious. The patient does not have insomnia.     Blood pressure 112/74, pulse 100, temperature 98.1 F (36.7 C), temperature source Tympanic, height 5'  6" (1.676 m), weight 141 lb 9.6 oz (64.229 kg), SpO2 97 %.Body mass index is 22.87 kg/(m^2).  General Appearance: Neat and Well Groomed  Eye Contact:  Good  Speech:  Normal Rate  Volume:  Normal  Mood:  Same  Affect:  Constricted  Thought Process:  Linear and Logical  Orientation:  Full (Time, Place, and Person)  Thought Content:  Negative  Suicidal Thoughts:  No  Homicidal Thoughts:  No  Memory:  Immediate;   Good Recent;   Good Remote;   Good  Judgement:  Good  Insight:  Good  Psychomotor Activity:  Negative  Concentration:  Good  Recall:  Good  Fund of Knowledge: Good  Language: Good  Akathisia:  Negative  Handed:  Right unknown  AIMS (if  indicated):  Not done  Assets:  Communication Skills Desire for Improvement Social Support Vocational/Educational  ADL's:  Intact  Cognition: WNL  Sleep:  good   Is the patient at risk to self?  No. Has the patient been a risk to self in the past 6 months?  No. Has the patient been a risk to self within the distant past?  No. Is the patient a risk to others?  No. Has the patient been a risk to others in the past 6 months?  No. Has the patient been a risk to others within the distant past?  No.  Current Medications: Current Outpatient Prescriptions  Medication Sig Dispense Refill  . ALPRAZolam (XANAX) 0.5 MG tablet Take 0.5 tablets (0.25 mg total) by mouth 2 (two) times daily as needed for anxiety. 60 tablet 1  . cetirizine (ZYRTEC) 10 MG tablet Take 10 mg by mouth daily.    . fluticasone (FLONASE) 50 MCG/ACT nasal spray Place 2 sprays into both nostrils daily.    Marland Kitchen ibuprofen (ADVIL,MOTRIN) 600 MG tablet Take 1 tablet (600 mg total) by mouth every 8 (eight) hours as needed. 15 tablet 0  . medroxyPROGESTERone (DEPO-PROVERA) 150 MG/ML injection Inject into the muscle.    Marland Kitchen buPROPion (WELLBUTRIN SR) 100 MG 12 hr tablet Take 1 tablet (100 mg total) by mouth 2 (two) times daily. 60 tablet 1  . etonogestrel (NEXPLANON) 68 MG IMPL implant Inject into the skin.    . Multiple Vitamins-Minerals (MULTIVITAMIN GUMMIES ADULT) CHEW Chew by mouth.    . ondansetron (ZOFRAN) 4 MG tablet Take 1 tablet (4 mg total) by mouth every 8 (eight) hours as needed for nausea or vomiting. (Patient not taking: Reported on 08/27/2015) 30 tablet 0  . oxyCODONE-acetaminophen (ROXICET) 5-325 MG per tablet Take 1-2 tablets by mouth every 4 (four) hours as needed for severe pain. (Patient not taking: Reported on 08/27/2015) 30 tablet 0  . tamsulosin (FLOMAX) 0.4 MG CAPS capsule Take 1 capsule (0.4 mg total) by mouth daily. (Patient not taking: Reported on 08/27/2015) 14 capsule 0   No current facility-administered medications  for this visit.    Medical Decision Making:  Established Problem, Stable/Improving (1)  Treatment Plan Summary:Medication management and Plan we will change her from the Wellbutrin XL to Wellbutrin SR 100 mg twice a day. We'll increase her alprazolam 2.5 mg twice daily as needed for anxiety. The patient will follow up in 1 month. She's been encouraged call any questions or concerns prior to her next appointment.   Wallace Going 08/28/2015, 1:58 PM

## 2015-08-30 DIAGNOSIS — N132 Hydronephrosis with renal and ureteral calculous obstruction: Secondary | ICD-10-CM | POA: Insufficient documentation

## 2015-08-30 DIAGNOSIS — R3129 Other microscopic hematuria: Secondary | ICD-10-CM | POA: Insufficient documentation

## 2015-08-30 DIAGNOSIS — Z87442 Personal history of urinary calculi: Secondary | ICD-10-CM | POA: Insufficient documentation

## 2015-08-30 DIAGNOSIS — N201 Calculus of ureter: Secondary | ICD-10-CM | POA: Insufficient documentation

## 2015-09-15 ENCOUNTER — Ambulatory Visit
Admission: RE | Admit: 2015-09-15 | Discharge: 2015-09-15 | Disposition: A | Discharge status: Home / Self Care | Payer: BLUE CROSS/BLUE SHIELD | Source: Ambulatory Visit | Attending: Urology | Admitting: Urology

## 2015-09-15 DIAGNOSIS — N201 Calculus of ureter: Secondary | ICD-10-CM

## 2015-09-15 DIAGNOSIS — N1330 Unspecified hydronephrosis: Secondary | ICD-10-CM | POA: Diagnosis present

## 2015-09-17 ENCOUNTER — Telehealth: Payer: Self-pay

## 2015-09-17 DIAGNOSIS — N200 Calculus of kidney: Secondary | ICD-10-CM

## 2015-09-17 NOTE — Telephone Encounter (Signed)
-----   Message from Harle Battiest, PA-C sent at 09/17/2015  8:47 AM EDT ----- There is still some swelling in her kidney, so she most likely has not passed the stone.  If she is not uncomfortable, she can take tamsulosin and repeat the ultrasound in 2 weeks.  If she is uncomfortable, she should have and KUB and think about having a procedure to rid herself of the stone.

## 2015-09-17 NOTE — Telephone Encounter (Signed)
-----   Message from Shannon A McGowan, PA-C sent at 09/17/2015  8:47 AM EDT ----- There is still some swelling in her kidney, so she most likely has not passed the stone.  If she is not uncomfortable, she can take tamsulosin and repeat the ultrasound in 2 weeks.  If she is uncomfortable, she should have and KUB and think about having a procedure to rid herself of the stone. 

## 2015-09-17 NOTE — Telephone Encounter (Signed)
Spoke with pt who stated she is still uncomfortable and would prefer a procedure to help rid the stone. Therefore, KUB orders were placed and pt was transferred to the front to make f/u appt.

## 2015-09-17 NOTE — Telephone Encounter (Signed)
LMOM

## 2015-09-18 ENCOUNTER — Ambulatory Visit (INDEPENDENT_AMBULATORY_CARE_PROVIDER_SITE_OTHER): Payer: BLUE CROSS/BLUE SHIELD | Admitting: Urology

## 2015-09-18 ENCOUNTER — Ambulatory Visit
Admission: RE | Admit: 2015-09-18 | Discharge: 2015-09-18 | Disposition: A | Discharge status: Home / Self Care | Payer: BLUE CROSS/BLUE SHIELD | Source: Ambulatory Visit | Attending: Urology | Admitting: Urology

## 2015-09-18 ENCOUNTER — Encounter: Payer: Self-pay | Admitting: Urology

## 2015-09-18 VITALS — BP 115/76 | HR 97 | Ht 66.0 in | Wt 139.5 lb

## 2015-09-18 DIAGNOSIS — N201 Calculus of ureter: Secondary | ICD-10-CM | POA: Diagnosis not present

## 2015-09-18 DIAGNOSIS — N132 Hydronephrosis with renal and ureteral calculous obstruction: Secondary | ICD-10-CM

## 2015-09-18 DIAGNOSIS — R312 Other microscopic hematuria: Secondary | ICD-10-CM | POA: Diagnosis not present

## 2015-09-18 DIAGNOSIS — K5900 Constipation, unspecified: Secondary | ICD-10-CM | POA: Insufficient documentation

## 2015-09-18 DIAGNOSIS — N200 Calculus of kidney: Secondary | ICD-10-CM

## 2015-09-18 DIAGNOSIS — R3129 Other microscopic hematuria: Secondary | ICD-10-CM

## 2015-09-18 NOTE — Progress Notes (Signed)
09/18/2015 4:26 PM   Janice Gray February 11, 1981 [id number]  Referring [name]: [facility] [address] [place], [place] [address]  Chief Complaint  Patient presents with  . Results    KUB results    HPI: The patient is a 34 year old female with recurrent nephrolithiasis who recently passed a 2 mm stone. This was confirmed on ultrasound and KUB today. She has been symptom free now for a number of weeks. She was unable to collect the stone via straining.    PMH: Past Medical History  Diagnosis Date  . Anxiety   . Depression   . History of kidney stones     Surgical History: Past Surgical History  Procedure Laterality Date  . Breast surgery      Implants  . Mandible fracture surgery    . Wisdom teeth surgery      Home Medications:    Medication List       This list is accurate as of: 09/18/15  4:26 PM.  Always use your most recent med list.               ALPRAZolam 0.5 MG tablet  Commonly known as:  XANAX  Take 0.5 tablets (0.25 mg total) by mouth 2 (two) times daily as needed for anxiety.     buPROPion 100 MG 12 hr tablet  Commonly known as:  WELLBUTRIN SR  Take 1 tablet (100 mg total) by mouth 2 (two) times daily.     cetirizine 10 MG tablet  Commonly known as:  ZYRTEC  Take 10 mg by mouth daily.     DEPO-PROVERA 150 MG/ML injection  Generic drug:  medroxyPROGESTERone  Inject into the muscle.     fluticasone 50 MCG/ACT nasal spray  Commonly known as:  FLONASE  Place 2 sprays into both nostrils daily.     ibuprofen 600 MG tablet  Commonly known as:  ADVIL,MOTRIN  Take 1 tablet (600 mg total) by mouth every 8 (eight) hours as needed.     MULTIVITAMIN GUMMIES ADULT Chew  Chew by mouth.     ondansetron 4 MG tablet  Commonly known as:  ZOFRAN  Take 1 tablet (4 mg total) by mouth every 8 (eight) hours as needed for nausea or vomiting.     oxyCODONE-acetaminophen 5-325 MG tablet  Commonly known as:  ROXICET  Take 1-2 tablets  by mouth every 4 (four) hours as needed for severe pain.     tamsulosin 0.4 MG Caps capsule  Commonly known as:  FLOMAX  Take 1 capsule (0.4 mg total) by mouth daily.        Allergies: No Known Allergies  Family History: Family History  Problem Relation Age of Onset  . Kidney disease Neg Hx   . Bladder Cancer Neg Hx     Social History:  reports that she has been smoking E-cigarettes and Cigarettes.  She started smoking about 15 years ago. She has been smoking about 0.50 packs per day. She has never used smokeless tobacco. She reports that she drinks alcohol. She reports that she does not use illicit drugs.  ROS: UROLOGY Frequent Urination?: No Hard to postpone urination?: No Burning/pain with urination?: No Get up at night to urinate?: No Leakage of urine?: No Urine stream starts and stops?: No Trouble starting stream?: No Do you have to strain to urinate?: No Blood in urine?: No Urinary tract infection?: No Sexually transmitted disease?: No Injury to kidneys or bladder?: No Painful intercourse?: No Weak stream?:  No Currently pregnant?: No Vaginal bleeding?: No Last menstrual period?: n  Gastrointestinal Nausea?: No Vomiting?: No Indigestion/heartburn?: No Diarrhea?: No Constipation?: No  Constitutional Fever: No Night sweats?: No Weight loss?: No Fatigue?: No  Skin Skin rash/lesions?: No Itching?: No  Eyes Blurred vision?: No Double vision?: No  Ears/Nose/Throat Sore throat?: No Sinus problems?: No  Hematologic/Lymphatic Swollen glands?: No Easy bruising?: No  Cardiovascular Leg swelling?: No Chest pain?: No  Respiratory Cough?: No Shortness of breath?: No  Endocrine Excessive thirst?: No  Musculoskeletal Back pain?: No Joint pain?: No  Neurological Headaches?: No Dizziness?: No  Psychologic Depression?: No Anxiety?: No  Physical Exam: BP 115/76 mmHg  Pulse 97  Ht  (1.676 m)  Wt 139 lb 8 oz (63.277 kg)  BMI 22.53  kg/m2  Constitutional:  Alert and oriented, No acute distress. HEENT: Free [place]il AT, moist mucus membranes.  Trachea midline, no masses. Cardiovascular: No clubbing, cyanosis, or edema. Respiratory: Normal respiratory effort, no increased work of breathing. GI: Abdomen is soft, nontender, nondistended, no abdominal masses GU: No CVA tenderness.  Skin: No rashes, bruises or suspicious lesions. Lymph: No cervical or inguinal adenopathy. Neurologic: Grossly intact, no focal deficits, moving all 4 extremities. Psychiatric: Normal mood and affect.  Laboratory Data: Lab Results  Component Value Date   WBC 14.3* 08/21/2015   HGB 13.8 08/21/2015   HCT 42.1 08/21/2015   MCV 90.0 08/21/2015   PLT 253 08/21/2015    Lab Results  Component Value Date   CREATININE 0.64 08/20/2015    No results found for: PSA  No results found for: TESTOSTERONE  No results found for: HGBA1C  Urinalysis    Component Value Date/Time   COLORURINE YELLOW* 08/21/2015 1546   COLORURINE Yellow 01/30/2014 0317   APPEARANCEUR HAZY* 08/21/2015 1546   APPEARANCEUR Hazy 01/30/2014 0317   LABSPEC 1.025 08/21/2015 1546   LABSPEC 1.021 01/30/2014 0317   PHURINE 5.0 08/21/2015 1546   PHURINE 5.0 01/30/2014 0317   GLUCOSEU Negative 08/27/2015 1535   GLUCOSEU Negative 01/30/2014 0317   HGBUR 3+* 08/21/2015 1546   HGBUR 3+ 01/30/2014 0317   BILIRUBINUR Negative 08/27/2015 1535   BILIRUBINUR NEGATIVE 08/21/2015 1546   BILIRUBINUR Negative 01/30/2014 0317   KETONESUR 1+* 08/21/2015 1546   KETONESUR Negative 01/30/2014 0317   PROTEINUR NEGATIVE 08/21/2015 1546   PROTEINUR Negative 01/30/2014 0317   UROBILINOGEN 1.0 01/15/2008 0742   NITRITE Negative 08/27/2015 1535   NITRITE NEGATIVE 08/21/2015 1546   NITRITE Negative 01/30/2014 0317   LEUKOCYTESUR Negative 08/27/2015 1535   LEUKOCYTESUR TRACE* 08/21/2015 1546   LEUKOCYTESUR 2+ 01/30/2014 0317    U/A today: no blood  Pertinent Imaging: IMPRESSION: 1. Improved  right hydronephrosis. Minimal fullness of the renal pelvis persists. 2. Normal appearance of the left kidney and bladder.  IMPRESSION: 1. No radiopaque urolithiasis. 2. Moderate colonic stool volume, which could indicate constipation.  Assessment & Plan:    1. Recurrent nephrolithiasis. The patient has passed her most recent stone. This is her second stone. We will arrange for her to undergo 24-hour urine collection.  2. Microscopic hematuria This has resolved on today's urinalysis as would be expected after passing a stone.  3. Hydronephrosis This has resolved on her most recent ultrasound    Return in about 4 weeks (around 10/16/2015) for after 24 hour urine analysis.  Hildred Laser, MD  [facility] [address] [place], [place] [address] [phone]

## 2015-09-21 ENCOUNTER — Telehealth: Payer: Self-pay | Admitting: Urology

## 2015-09-21 ENCOUNTER — Telehealth: Payer: Self-pay | Admitting: *Deleted

## 2015-09-21 LAB — URINALYSIS, COMPLETE
BILIRUBIN UA: NEGATIVE
Glucose, UA: NEGATIVE
Ketones, UA: NEGATIVE
LEUKOCYTES UA: NEGATIVE
Nitrite, UA: NEGATIVE
PH UA: 8 — AB (ref 5.0–7.5)
PROTEIN UA: NEGATIVE
Specific Gravity, UA: 1.015 (ref 1.005–1.030)
Urobilinogen, Ur: 0.2 mg/dL (ref 0.2–1.0)

## 2015-09-21 LAB — MICROSCOPIC EXAMINATION
BACTERIA UA: NONE SEEN
RBC, UA: NONE SEEN /hpf (ref 0–?)
RENAL EPITHEL UA: NONE SEEN /HPF
WBC, UA: NONE SEEN /hpf (ref 0–?)

## 2015-09-21 NOTE — Telephone Encounter (Signed)
-----   Message from Harle Battiest, PA-C sent at 09/18/2015  4:14 PM EDT ----- Patient has a large volume of stool in her colon.  She needs to take a laxative and have the KUB repeated.

## 2015-09-21 NOTE — Telephone Encounter (Signed)
Mervin Kung, CMA at 09/21/2015 11:50 AM     Status: Signed       Expand All Collapse All   Spoke with patient and gave results. Instructed patient to take a laxative and have the KUB repeated. Patient states she is always constipated. I told her that taking a laxative should clear her out for the KUB and not to wait that as soon as she gets cleaned out from laxative to go on over and have the KUB. Patient ok with plan.            Mervin Kung, CMA at 09/21/2015 11:49 AM     Status: Signed       Expand All Collapse All   ----- Message from Harle Battiest, PA-C sent at 09/18/2015 4:14 PM EDT ----- Patient has a large volume of stool in her colon. She needs to take a laxative and have the KUB repeated.

## 2015-09-21 NOTE — Telephone Encounter (Signed)
Spoke with patient and she states she was given results Friday when she saw Dr. Sherryl Barters and was told to follow up in 1 month with 24hour urine results, per shannon this was fine to keep as long as she wasn't having any pain or discomfort , patient states that she is not and will keep follow up in 81month

## 2015-09-21 NOTE — Telephone Encounter (Signed)
Spoke with patient and gave results. Instructed patient to take a laxative and have the KUB repeated. Patient states she is always constipated. I told her that taking a laxative should clear her out for the KUB and not to wait that as soon as she gets cleaned out from laxative to go on over and have the KUB. Patient ok with plan.

## 2015-09-24 ENCOUNTER — Ambulatory Visit: Payer: Self-pay | Admitting: Urology

## 2015-09-28 ENCOUNTER — Ambulatory Visit (INDEPENDENT_AMBULATORY_CARE_PROVIDER_SITE_OTHER): Payer: BLUE CROSS/BLUE SHIELD | Admitting: Psychiatry

## 2015-09-28 ENCOUNTER — Encounter: Payer: Self-pay | Admitting: Psychiatry

## 2015-09-28 VITALS — BP 122/88 | HR 92 | Temp 97.3°F | Ht 66.0 in | Wt 142.0 lb

## 2015-09-28 DIAGNOSIS — F331 Major depressive disorder, recurrent, moderate: Secondary | ICD-10-CM | POA: Diagnosis not present

## 2015-09-28 DIAGNOSIS — F4010 Social phobia, unspecified: Secondary | ICD-10-CM

## 2015-09-28 MED ORDER — ALPRAZOLAM 0.5 MG PO TABS: 0.5000 mg | ORAL_TABLET | Freq: Two times a day (BID) | ORAL | Status: DC | PRN

## 2015-09-28 MED ORDER — BUPROPION HCL ER (SR) 150 MG PO TB12: 150.0000 mg | ORAL_TABLET | Freq: Two times a day (BID) | ORAL | Status: DC

## 2015-09-28 NOTE — Progress Notes (Signed)
BH MD/PA/NP OP Progress Note  09/28/2015 3:03 PM Janice Gray  MRN:  [id number]   Subjective: Patient presents for follow-up of her major depressive disorder and social anxiety disorder. She has been on the Wellbutrin SR 100 mg twice daily since her last visit in September. She states that she has not noticed a dramatic difference however there are some positive things she has noticed. She states that she was feeling tired and fatigued and now she states that when she standing up working she does not feel that constant level of feeling like she wants to go to sleep. She states she also feels like she is sleeping better. She surmises this because she is able to dream and that is something she has in the past noticed occurs when she is having her most restful sleep.  She is interested in perhaps trying to increase her dose of Wellbutrin SR. I discussed with her perhaps trying to come in within a month to discuss a response to the change. However she did cite difficulty financially in the appointments and thus we agreed she would come in in 3 months but would call should she notice any problems or have any concerns. She states that her appetite is good and states this continues to be an issue even off the Prozac. 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 She states that the first few weeks were difficult as there was so many changes but she states now it is seems to be leveling out and working okay.   Chief Complaint: nothing Chief Complaint    Follow-up; Medication Refill     Visit Diagnosis:     ICD-9-CM ICD-10-CM   1. Major depressive disorder, recurrent episode, moderate (HCC) 296.32 F33.1   2. Social anxiety disorder 300.23 F40.10     Past Medical History:  Past Medical History  Diagnosis Date  . Anxiety   . Depression   . History of kidney stones     Past Surgical History  Procedure Laterality Date  . Breast surgery      Implants  .  Mandible fracture surgery    . Wisdom teeth surgery     Family History:  Family History  Problem Relation Age of Onset  . Kidney disease Neg Hx   . Bladder Cancer Neg Hx    Social History:  Social History   Social History  . Marital Status: Single    Spouse Name: N/A  . Number of Children: N/A  . Years of Education: N/A   Social History Main Topics  . Smoking status: Current Every Day Smoker -- 0.50 packs/day    Types: E-cigarettes, Cigarettes    Start date: 06/28/2000  . Smokeless tobacco: Never Used  . Alcohol Use: 0.0 oz/week    0 Standard drinks or equivalent per week     Comment: special occ./ social  . Drug Use: No  . Sexual Activity: Yes    Birth Control/ Protection: None   Other Topics Concern  . None   Social History Narrative   Additional History:   Assessment:   Musculoskeletal: Strength & Muscle Tone: within normal limits Gait & Station: normal Patient leans: N/A  Psychiatric Specialty Exam: Anxiety Symptoms include nervous/anxious behavior (Continues to be at baseline. ). Patient reports no insomnia or suicidal ideas.    Depression        Associated symptoms include does not have insomnia and no suicidal ideas.  Past medical  history includes anxiety.     Review of Systems  Psychiatric/Behavioral: Positive for depression (note some improvement in energy and fatigue as discussed above). Negative for suicidal ideas, hallucinations, memory loss and substance abuse. The patient is nervous/anxious (Continues to be at baseline. ). The patient does not have insomnia.   All other systems reviewed and are negative.   Blood pressure 122/88, pulse 92, temperature 97.3 F (36.3 C), temperature source Tympanic, height  (1.676 m), weight 142 lb (64.411 kg), SpO2 99 %.Body mass index is 22.93 kg/(m^2).  General Appearance: Neat and Well Groomed  Eye Contact:  Good  Speech:  Normal Rate  Volume:  Normal  Mood:  Same  Affect:  Constricted  Thought  Process:  Linear and Logical  Orientation:  Full (Time, Place, and Person)  Thought Content:  Negative  Suicidal Thoughts:  No  Homicidal Thoughts:  No  Memory:  Immediate;   Good Recent;   Good Remote;   Good  Judgement:  Good  Insight:  Good  Psychomotor Activity:  Negative  Concentration:  Good  Recall:  Good  Fund of Knowledge: Good  Language: Good  Akathisia:  Negative  Handed:  Right unknown  AIMS (if indicated):  Not done  Assets:  Communication Skills Desire for Improvement Social Support Vocational/Educational  ADL's:  Intact  Cognition: WNL  Sleep:  good   Is the patient at risk to self?  No. Has the patient been a risk to self in the past 6 months?  No. Has the patient been a risk to self within the distant past?  No. Is the patient a risk to others?  No. Has the patient been a risk to others in the past 6 months?  No. Has the patient been a risk to others within the distant past?  No.  Current Medications: Current Outpatient Prescriptions  Medication Sig Dispense Refill  . ALPRAZolam (XANAX) 0.5 MG tablet Take 1 tablet (0.5 mg total) by mouth 2 (two) times daily as needed for anxiety. 60 tablet 1  . buPROPion (WELLBUTRIN SR) 150 MG 12 hr tablet Take 1 tablet (150 mg total) by mouth 2 (two) times daily. 60 tablet 2  . cetirizine (ZYRTEC) 10 MG tablet Take 10 mg by mouth daily.    . fluticasone (FLONASE) 50 MCG/ACT nasal spray Place 2 sprays into both nostrils daily.    Marland Kitchen ibuprofen (ADVIL,MOTRIN) 600 MG tablet Take 1 tablet (600 mg total) by mouth every 8 (eight) hours as needed. 15 tablet 0  . medroxyPROGESTERone (DEPO-PROVERA) 150 MG/ML injection Inject into the muscle.    . Multiple Vitamins-Minerals (MULTIVITAMIN GUMMIES ADULT) CHEW Chew by mouth.     No current facility-administered medications for this visit.    Medical Decision Making:  Established Problem, Stable/Improving (1)  Treatment Plan Summary:Medication management and Plan we will change her  from the   Major depressive disorder, recurrent, moderate increase her Wellbutrin SR from 100 mg twice a day to 150 mg twice a day  Social Anxiety Disorder-alprazolam .5 mg twice daily as needed for anxiety.   The patient will follow up in 3 month. She's been encouraged call any questions or concerns prior to her next appointment.   Wallace Going 09/28/2015, 3:03 PM

## 2015-10-05 ENCOUNTER — Other Ambulatory Visit: Payer: BLUE CROSS/BLUE SHIELD

## 2015-10-12 ENCOUNTER — Other Ambulatory Visit: Payer: Self-pay | Admitting: Urology

## 2015-11-04 DIAGNOSIS — F419 Anxiety disorder, unspecified: Secondary | ICD-10-CM | POA: Insufficient documentation

## 2015-11-04 DIAGNOSIS — F17210 Nicotine dependence, cigarettes, uncomplicated: Secondary | ICD-10-CM | POA: Insufficient documentation

## 2015-11-05 ENCOUNTER — Encounter: Payer: Self-pay | Admitting: Urology

## 2015-11-05 ENCOUNTER — Ambulatory Visit (INDEPENDENT_AMBULATORY_CARE_PROVIDER_SITE_OTHER): Payer: BLUE CROSS/BLUE SHIELD | Admitting: Obstetrics and Gynecology

## 2015-11-05 VITALS — BP 94/62 | HR 92 | Ht 66.0 in

## 2015-11-05 DIAGNOSIS — R3129 Other microscopic hematuria: Secondary | ICD-10-CM

## 2015-11-05 LAB — MICROSCOPIC EXAMINATION: WBC, UA: NONE SEEN /hpf (ref 0–?)

## 2015-11-05 LAB — URINALYSIS, COMPLETE
Bilirubin, UA: NEGATIVE
Glucose, UA: NEGATIVE
KETONES UA: NEGATIVE
LEUKOCYTES UA: NEGATIVE
Nitrite, UA: NEGATIVE
Urobilinogen, Ur: 0.2 mg/dL (ref 0.2–1.0)
pH, UA: 5 (ref 5.0–7.5)

## 2015-11-05 NOTE — Progress Notes (Signed)
11/05/2015 8:20 PM   Martisha Vitiello February 27, 1981 [id number]  Referring provider: [facility] [address] [place], [place] [address]  Chief Complaint  Patient presents with  . Results    24 hour urine    HPI: Patient is a 34 year old female with a history of renal stones presenting today to discuss her 24-hour urine results. She reports no further urinary symptoms or pain. She was unable to collect her previous stones. We did not know her stone composition.  PMH: Past Medical History  Diagnosis Date  . Anxiety   . Depression   . History of kidney stones   . Microscopic hematuria     Surgical History: Past Surgical History  Procedure Laterality Date  . Breast surgery      Implants  . Mandible fracture surgery    . Wisdom teeth surgery      Home Medications:    Medication List       This list is accurate as of: 11/05/15  8:20 PM.  Always use your most recent med list.               ALPRAZolam 0.5 MG tablet  Commonly known as:  XANAX  Take 1 tablet (0.5 mg total) by mouth 2 (two) times daily as needed for anxiety.     buPROPion 150 MG 12 hr tablet  Commonly known as:  WELLBUTRIN SR  Take 1 tablet (150 mg total) by mouth 2 (two) times daily.     cetirizine 10 MG tablet  Commonly known as:  ZYRTEC  Take 10 mg by mouth daily.     DEPO-PROVERA 150 MG/ML injection  Generic drug:  medroxyPROGESTERone  Inject into the muscle.     fluticasone 50 MCG/ACT nasal spray  Commonly known as:  FLONASE  Place 2 sprays into both nostrils daily.     ibuprofen 600 MG tablet  Commonly known as:  ADVIL,MOTRIN  Take 1 tablet (600 mg total) by mouth every 8 (eight) hours as needed.     MULTIVITAMIN GUMMIES ADULT Chew  Chew by mouth.        Allergies: No Known Allergies  Family History: Family History  Problem Relation Age of Onset  . Kidney disease Neg Hx   . Bladder Cancer Neg Hx     Social History:  reports that she has been smoking  E-cigarettes and Cigarettes.  She started smoking about 15 years ago. She has been smoking about 0.50 packs per day. She has never used smokeless tobacco. She reports that she drinks alcohol. She reports that she does not use illicit drugs.  ROS: UROLOGY Frequent Urination?: No Hard to postpone urination?: No Burning/pain with urination?: No Get up at night to urinate?: No Leakage of urine?: No Urine stream starts and stops?: No Trouble starting stream?: No Do you have to strain to urinate?: No Blood in urine?: No Urinary tract infection?: No Sexually transmitted disease?: No Injury to kidneys or bladder?: No Painful intercourse?: No Weak stream?: No Currently pregnant?: No Vaginal bleeding?: No Last menstrual period?: n  Gastrointestinal Nausea?: No Vomiting?: No Indigestion/heartburn?: No Diarrhea?: No Constipation?: No  Constitutional Fever: No Night sweats?: No Weight loss?: No Fatigue?: No  Skin Skin rash/lesions?: No Itching?: No  Eyes Blurred vision?: No Double vision?: No  Ears/Nose/Throat Sore throat?: No Sinus problems?: Yes  Hematologic/Lymphatic Swollen glands?: No Easy bruising?: No  Cardiovascular Leg swelling?: No Chest pain?: No  Respiratory Cough?: No Shortness of breath?: No  Endocrine Excessive thirst?:  No  Musculoskeletal Back pain?: No Joint pain?: Yes  Neurological Headaches?: No Dizziness?: No  Psychologic Depression?: Yes Anxiety?: Yes  Physical Exam: BP 94/62 mmHg  Pulse 92  Ht 5\' 6"  (1.676 m)  Constitutional:  Alert and oriented, No acute distress. HEENT: [place] AT, moist mucus membranes.  Trachea midline, no masses. Cardiovascular: No clubbing, cyanosis, or edema. Respiratory: Normal respiratory effort, no increased work of breathing. Skin: No rashes, bruises or suspicious lesions. Neurologic: Grossly intact, no focal deficits, moving all 4 extremities. Psychiatric: Normal mood and affect.  Laboratory  Data:   Urinalysis    Component Value Date/Time   COLORURINE YELLOW* 08/21/2015 1546   COLORURINE Yellow 01/30/2014 0317   APPEARANCEUR HAZY* 08/21/2015 1546   APPEARANCEUR Hazy 01/30/2014 0317   LABSPEC 1.025 08/21/2015 1546   LABSPEC 1.021 01/30/2014 0317   PHURINE 5.0 08/21/2015 1546   PHURINE 5.0 01/30/2014 0317   GLUCOSEU Negative 11/05/2015 1542   GLUCOSEU Negative 01/30/2014 0317   HGBUR 3+* 08/21/2015 1546   HGBUR 3+ 01/30/2014 0317   BILIRUBINUR Negative 11/05/2015 1542   BILIRUBINUR NEGATIVE 08/21/2015 1546   BILIRUBINUR Negative 01/30/2014 0317   KETONESUR 1+* 08/21/2015 1546   KETONESUR Negative 01/30/2014 0317   PROTEINUR NEGATIVE 08/21/2015 1546   PROTEINUR Negative 01/30/2014 0317   UROBILINOGEN 1.0 01/15/2008 0742   NITRITE Negative 11/05/2015 1542   NITRITE NEGATIVE 08/21/2015 1546   NITRITE Negative 01/30/2014 0317   LEUKOCYTESUR Negative 11/05/2015 1542   LEUKOCYTESUR TRACE* 08/21/2015 1546   LEUKOCYTESUR 2+ 01/30/2014 0317    Pertinent Imaging:   Assessment & Plan:    1. Kidney Stones- 24-hour urine results reviewed with patient. Patient advised to increase her fluid intake with a goal of an output of 2.5 L urine daily. She was advised to significantly increase her citrate intake. She did not desire treatment with potassiums citrate at this time. She was encouraged to decrease high oxalate foods and sodium in her diet. Written recommendations concerning stone prevention were provided. Patient's questions were answered. She would like to follow up on an as-needed basis. - Urinalysis, Complete  Return if symptoms worsen or fail to improve.  These notes generated with voice recognition software. I apologize for typographical errors.  Earlie LouLindsay Jailene Cupit, FNP  [facility] [address], [address] [place], [place] [address] [phone]

## 2015-11-10 ENCOUNTER — Encounter: Payer: Self-pay | Admitting: Psychiatry

## 2015-11-10 ENCOUNTER — Ambulatory Visit (INDEPENDENT_AMBULATORY_CARE_PROVIDER_SITE_OTHER): Payer: BLUE CROSS/BLUE SHIELD | Admitting: Psychiatry

## 2015-11-10 VITALS — BP 122/60 | HR 92 | Temp 99.0°F | Ht 66.0 in | Wt 137.8 lb

## 2015-11-10 DIAGNOSIS — F331 Major depressive disorder, recurrent, moderate: Secondary | ICD-10-CM

## 2015-11-10 DIAGNOSIS — F4010 Social phobia, unspecified: Secondary | ICD-10-CM

## 2015-11-10 MED ORDER — BREXPIPRAZOLE 1 MG PO TABS: 1.0000 mg | ORAL_TABLET | ORAL | Status: DC

## 2015-11-10 MED ORDER — FLUOXETINE HCL 20 MG PO CAPS: 20.0000 mg | ORAL_CAPSULE | Freq: Every day | ORAL | Status: DC

## 2015-11-10 NOTE — Addendum Note (Signed)
Addended by: Kerin SalenWILLIAMS, Jodee Wagenaar L on: 11/10/2015 03:55 PM   Modules accepted: Orders, Medications

## 2015-11-10 NOTE — Progress Notes (Addendum)
BH MD/PA/NP OP Progress Note  11/10/2015 3:39 PM Janice Gray  MRN:  [id number]   Subjective: Patient presents for follow-up of her major depressive disorder and social anxiety disorder. At the last visit we increased her Wellbutrin SR from 100 mg twice a day to 150 mg twice daily. She states the only thing she's notices that the medicine does help her get up and get through the day. She has not noticed an improvement in her depression. She states she now feels like her depression has worsened. She states that she does have crying episodes. She also states that her anxiety is bad and she is now been using the alprazolam on a standing regular basis.  We have reviewed her past medications. She states that Paxil was effective but she went through withdrawals when it was discontinued. She did not notice any benefit from Effexor. She indicated on intake that she had not been on Zoloft or Cymbalta. Have tried augmenting with Seroquel in the past. She also states she's had a trial of Abilify. She had been maintained for years on a high dose of Prozac but eventually it stopped working. We discussed that the options would be to attempt to augment with Rexulti or to retry an antidepressant that she been on the past with her Wellbutrin.  Chief Complaint: Miserable  Visit Diagnosis:     ICD-9-CM ICD-10-CM   1. Major depressive disorder, recurrent episode, moderate (HCC) 296.32 F33.1   2. Social anxiety disorder 300.23 F40.10     Past Medical History:  Past Medical History  Diagnosis Date  . Anxiety   . Depression   . History of kidney stones   . Microscopic hematuria     Past Surgical History  Procedure Laterality Date  . Breast surgery      Implants  . Mandible fracture surgery    . Wisdom teeth surgery     Family History:  Family History  Problem Relation Age of Onset  . Kidney disease Neg Hx   . Bladder Cancer Neg Hx    Social History:  Social History   Social History  . Marital  Status: Single    Spouse Name: N/A  . Number of Children: N/A  . Years of Education: N/A   Social History Main Topics  . Smoking status: Current Every Day Smoker -- 0.50 packs/day    Types: E-cigarettes, Cigarettes    Start date: 06/28/2000  . Smokeless tobacco: Never Used  . Alcohol Use: 0.0 oz/week    0 Standard drinks or equivalent per week     Comment: special occ./ social  . Drug Use: No  . Sexual Activity: Yes    Birth Control/ Protection: None   Other Topics Concern  . Not on file   Social History Narrative   Additional History:   Assessment:   Musculoskeletal: Strength & Muscle Tone: within normal limits Gait & Station: normal Patient leans: N/A  Psychiatric Specialty Exam: Anxiety Symptoms include nervous/anxious behavior. Patient reports no insomnia or suicidal ideas.    Depression        Associated symptoms include does not have insomnia and no suicidal ideas.  Past medical history includes anxiety.     Review of Systems  Psychiatric/Behavioral: Positive for depression (improvement in energy, otherwise reports worsening depression). Negative for suicidal ideas, hallucinations, memory loss and substance abuse. The patient is nervous/anxious. The patient does not have insomnia.   All other systems reviewed and are negative.   There were no vitals taken  for this visit.There is no weight on file to calculate BMI.  General Appearance: Neat and Well Groomed  Eye Contact:  Good  Speech:  Normal Rate  Volume:  Normal  Mood:  Depressed  Affect:  Congruent and Tearful  Thought Process:  Linear and Logical  Orientation:  Full (Time, Place, and Person)  Thought Content:  Negative  Suicidal Thoughts:  No  Homicidal Thoughts:  No  Memory:  Immediate;   Good Recent;   Good Remote;   Good  Judgement:  Good  Insight:  Good  Psychomotor Activity:  Negative  Concentration:  Good  Recall:  Good  Fund of Knowledge: Good  Language: Good  Akathisia:  Negative   Handed:  Right unknown  AIMS (if indicated):  Not done  Assets:  Communication Skills Desire for Improvement Social Support Vocational/Educational  ADL's:  Intact  Cognition: WNL  Sleep:  good   Is the patient at risk to self?  No. Has the patient been a risk to self in the past 6 months?  No. Has the patient been a risk to self within the distant past?  No. Is the patient a risk to others?  No. Has the patient been a risk to others in the past 6 months?  No. Has the patient been a risk to others within the distant past?  No.  Current Medications: Current Outpatient Prescriptions  Medication Sig Dispense Refill  . ALPRAZolam (XANAX) 0.5 MG tablet Take 1 tablet (0.5 mg total) by mouth 2 (two) times daily as needed for anxiety. 60 tablet 1  . Brexpiprazole (REXULTI) 1 MG TABS Take 1 mg by mouth every morning. 30 tablet 1  . buPROPion (WELLBUTRIN SR) 150 MG 12 hr tablet Take 1 tablet (150 mg total) by mouth 2 (two) times daily. 60 tablet 2  . cetirizine (ZYRTEC) 10 MG tablet Take 10 mg by mouth daily.    . fluticasone (FLONASE) 50 MCG/ACT nasal spray Place 2 sprays into both nostrils daily.    Marland Kitchen ibuprofen (ADVIL,MOTRIN) 600 MG tablet Take 1 tablet (600 mg total) by mouth every 8 (eight) hours as needed. 15 tablet 0  . medroxyPROGESTERone (DEPO-PROVERA) 150 MG/ML injection Inject into the muscle.    . Multiple Vitamins-Minerals (MULTIVITAMIN GUMMIES ADULT) CHEW Chew by mouth.     No current facility-administered medications for this visit.    Medical Decision Making:  Established Problem, Stable/Improving (1)  Treatment Plan Summary:Medication management and Plan we will change her from the   Major depressive disorder, recurrent, moderate continue her Wellbutrin SR  150 mg twice a day. In sitter augmenting with Rexulti, but see addendum below.     Social Anxiety Disorder-alprazolam .5 mg twice daily as needed for anxiety.   The patient will follow up in 2 weeks. She's been  encouraged call any questions or concerns prior to her next appointment.  ADDENDUM: While the staff were gathering her samples for Rexulti, patient check with her insurance and found out the cost of Rexulti was going to be $200 a month. She indicated this is prohibitive for her. Thus we are not going to use Rexulti. We will add Prozac which she has been on in the past to her Wellbutrin. We will restart her Prozac at 20 mg daily.   In regards to risk assessment the patient has risk factors of major depressive disorder. She has protective factors of social supports, minor child living in the home, engage in treatment, no prior suicide attempts and employment. At  this time low risk of imminent harm to self or others.   Wallace Going 11/10/2015, 3:39 PM

## 2015-11-16 NOTE — Progress Notes (Signed)
ADDENDUM: While the staff were gathering her samples for Rexulti, patient check with her insurance and found out the cost of Rexulti was going to be $200 a month. She indicated this is prohibitive for her. Thus we are not going to use Rexulti. We will add Prozac which she has been on in the past to her Wellbutrin. We will restart her Prozac at 20 mg daily.

## 2015-11-24 ENCOUNTER — Encounter: Payer: Self-pay | Admitting: Psychiatry

## 2015-11-24 ENCOUNTER — Ambulatory Visit (INDEPENDENT_AMBULATORY_CARE_PROVIDER_SITE_OTHER): Payer: BLUE CROSS/BLUE SHIELD | Admitting: Psychiatry

## 2015-11-24 VITALS — BP 100/62 | HR 104 | Temp 97.8°F | Ht 66.0 in | Wt 134.6 lb

## 2015-11-24 DIAGNOSIS — F331 Major depressive disorder, recurrent, moderate: Secondary | ICD-10-CM | POA: Diagnosis not present

## 2015-11-24 DIAGNOSIS — F4010 Social phobia, unspecified: Secondary | ICD-10-CM | POA: Diagnosis not present

## 2015-11-24 DIAGNOSIS — N200 Calculus of kidney: Secondary | ICD-10-CM | POA: Insufficient documentation

## 2015-11-24 DIAGNOSIS — K581 Irritable bowel syndrome with constipation: Secondary | ICD-10-CM | POA: Insufficient documentation

## 2015-11-24 MED ORDER — BUPROPION HCL ER (SR) 150 MG PO TB12: 150.0000 mg | ORAL_TABLET | Freq: Two times a day (BID) | ORAL | Status: DC

## 2015-11-24 MED ORDER — FLUOXETINE HCL 40 MG PO CAPS: 40.0000 mg | ORAL_CAPSULE | Freq: Every day | ORAL | Status: DC

## 2015-11-24 NOTE — Progress Notes (Signed)
BH MD/PA/NP OP Progress Note  11/24/2015 4:17 PM Janice BrideLiana Gray  MRN:  [id number]   Subjective: Patient presents for follow-up of her major depressive disorder and social anxiety disorder. At the last visit patient was very depressed and stated she felt like she wanted cry every 5 minutes. At that time we added Prozac to her regimen of Wellbutrin SR. Patient had previously been on Prozac at 80 mg. We discontinued that medication a couple of months ago and started her on Wellbutrin. At the last visit 2 weeks ago we started her back on Prozac 20 mg with the Wellbutrin SR. Today patient feels she is better. When asked which she feels is different she states she no longer feels like she wants to cry every 5 minutes. We had discussed trying to do some augmentation with an atypical however patient is operating under cost constraints and poor responses to other SSRIs.  She states that her anxiety is been somewhat well managed and she does not need a refill of the house lamb at this time. If she were using alprazolam on a regular basis she would be due for it now.  Chief Complaint: Miserable Chief Complaint    Follow-up; Medication Refill     Visit Diagnosis:   No diagnosis found.  Past Medical History:  Past Medical History  Diagnosis Date  . Anxiety   . Depression   . History of kidney stones   . Microscopic hematuria     Past Surgical History  Procedure Laterality Date  . Breast surgery      Implants  . Mandible fracture surgery    . Wisdom teeth surgery     Family History:  Family History  Problem Relation Age of Onset  . Kidney disease Neg Hx   . Bladder Cancer Neg Hx    Social History:  Social History   Social History  . Marital Status: Single    Spouse Name: N/A  . Number of Children: N/A  . Years of Education: N/A   Social History Main Topics  . Smoking status: Current Every Day Smoker -- 0.50 packs/day    Types: E-cigarettes, Cigarettes    Start date: 06/28/2000  .  Smokeless tobacco: Never Used  . Alcohol Use: 0.0 oz/week    0 Standard drinks or equivalent per week     Comment: special occ./ social  . Drug Use: No  . Sexual Activity: Yes    Birth Control/ Protection: None   Other Topics Concern  . None   Social History Narrative   Additional History:   Assessment:   Musculoskeletal: Strength & Muscle Tone: within normal limits Gait & Station: normal Patient leans: N/A  Psychiatric Specialty Exam: Anxiety Patient reports no insomnia, nervous/anxious behavior or suicidal ideas.    Depression        Associated symptoms include does not have insomnia and no suicidal ideas.  Past medical history includes anxiety.     Review of Systems  Psychiatric/Behavioral: Positive for depression (still present but significantly improved from 2 weeks ago). Negative for suicidal ideas, hallucinations, memory loss and substance abuse. The patient is not nervous/anxious and does not have insomnia.   All other systems reviewed and are negative.   Blood pressure 100/62, pulse 104, temperature 97.8 F (36.6 C), temperature source Tympanic, height 5\' 6"  (1.676 m), weight 134 lb 9.6 oz (61.054 kg), SpO2 96 %.Body mass index is 21.74 kg/(m^2).  General Appearance: Neat and Well Groomed  Eye Contact:  Good  Speech:  Normal Rate  Volume:  Normal  Mood:  Better  Affect:  Not tearful as in the last appointment, constricted  Thought Process:  Linear and Logical  Orientation:  Full (Time, Place, and Person)  Thought Content:  Negative  Suicidal Thoughts:  No  Homicidal Thoughts:  No  Memory:  Immediate;   Good Recent;   Good Remote;   Good  Judgement:  Good  Insight:  Good  Psychomotor Activity:  Negative  Concentration:  Good  Recall:  Good  Fund of Knowledge: Good  Language: Good  Akathisia:  Negative  Handed:  Right unknown  AIMS (if indicated):  Not done  Assets:  Communication Skills Desire for Improvement Social Support Vocational/Educational   ADL's:  Intact  Cognition: WNL  Sleep:  good   Is the patient at risk to self?  No. Has the patient been a risk to self in the past 6 months?  No. Has the patient been a risk to self within the distant past?  No. Is the patient a risk to others?  No. Has the patient been a risk to others in the past 6 months?  No. Has the patient been a risk to others within the distant past?  No.  Current Medications: Current Outpatient Prescriptions  Medication Sig Dispense Refill  . ALPRAZolam (XANAX) 0.5 MG tablet Take 1 tablet (0.5 mg total) by mouth 2 (two) times daily as needed for anxiety. 60 tablet 1  . buPROPion (WELLBUTRIN SR) 150 MG 12 hr tablet Take 1 tablet (150 mg total) by mouth 2 (two) times daily. 180 tablet 0  . cetirizine (ZYRTEC) 10 MG tablet Take 10 mg by mouth daily.    Marland Kitchen FLUoxetine (PROZAC) 40 MG capsule Take 1 capsule (40 mg total) by mouth daily. 30 capsule 3  . fluticasone (FLONASE) 50 MCG/ACT nasal spray Place 2 sprays into both nostrils daily.    Marland Kitchen ibuprofen (ADVIL,MOTRIN) 600 MG tablet Take 1 tablet (600 mg total) by mouth every 8 (eight) hours as needed. 15 tablet 0  . medroxyPROGESTERone (DEPO-PROVERA) 150 MG/ML injection Inject into the muscle.    . Multiple Vitamins-Minerals (MULTIVITAMIN GUMMIES ADULT) CHEW Chew by mouth.     No current facility-administered medications for this visit.    Medical Decision Making:  Established Problem, Stable/Improving (1)  Treatment Plan Summary:Medication management and Plan we will change her from the   Major depressive disorder, recurrent, moderate continue her Wellbutrin SR  150 mg twice a day. We'll increase her Prozac from 20 mg a day to 40 mg a day.  Social Anxiety Disorder-alprazolam .5 mg twice daily as needed for anxiety.   I informed patient I'll be departing from the clinic in February but that she would transition to another provider within this clinic. The patient will follow up in 1 month. She's been encouraged  call any questions or concerns prior to her next appointment.    Wallace Going 11/24/2015, 4:17 PM

## 2015-12-16 NOTE — Progress Notes (Signed)
Refilled - reorder 

## 2015-12-28 ENCOUNTER — Ambulatory Visit: Payer: BLUE CROSS/BLUE SHIELD | Admitting: Psychiatry

## 2015-12-29 ENCOUNTER — Ambulatory Visit: Payer: BLUE CROSS/BLUE SHIELD | Admitting: Psychiatry

## 2015-12-31 ENCOUNTER — Telehealth: Payer: Self-pay

## 2015-12-31 ENCOUNTER — Ambulatory Visit (INDEPENDENT_AMBULATORY_CARE_PROVIDER_SITE_OTHER): Payer: BLUE CROSS/BLUE SHIELD | Admitting: Psychiatry

## 2015-12-31 ENCOUNTER — Encounter: Payer: Self-pay | Admitting: Psychiatry

## 2015-12-31 VITALS — BP 118/68 | HR 100 | Temp 97.8°F | Ht 66.0 in | Wt 132.2 lb

## 2015-12-31 DIAGNOSIS — F331 Major depressive disorder, recurrent, moderate: Secondary | ICD-10-CM

## 2015-12-31 DIAGNOSIS — F4010 Social phobia, unspecified: Secondary | ICD-10-CM

## 2015-12-31 MED ORDER — ALPRAZOLAM 0.5 MG PO TABS: 0.5000 mg | ORAL_TABLET | Freq: Two times a day (BID) | ORAL | Status: DC | PRN

## 2015-12-31 MED ORDER — BUPROPION HCL ER (SR) 150 MG PO TB12: 150.0000 mg | ORAL_TABLET | Freq: Two times a day (BID) | ORAL | Status: DC

## 2015-12-31 MED ORDER — FLUOXETINE HCL 40 MG PO CAPS: 40.0000 mg | ORAL_CAPSULE | Freq: Every day | ORAL | Status: DC

## 2015-12-31 NOTE — Progress Notes (Signed)
BH MD/PA/NP OP Progress Note  12/31/2015 2:42 PM Janice Gray  MRN:  [id number]   Subjective: Patient presents for follow-up of her major depressive disorder and social anxiety disorder. Patient reports she is doing well. At the last visit we had continued her Wellbutrin XL and increased her dose of Prozac from 20 mg to 40 mg. She indicates that she has not noticed a significant difference from Prozac being increased from 20 mg to 40 mg. She states the biggest difference she noticed was when we initially added the 20 mg of Prozac to her Wellbutrin.   He states things continue to go well on her job. She states the manager that she had that had a reputation of being difficult actually likes the patient and patient feels like that has been going fairly well. Patient states she's sleeping fairly well most nights. She states she might go to bed at 9 fall asleep by 10 and then gets up at 5:30 for work. When asked whether she is able to enjoy anything she states she does not go out but she is "content" in her home.  Chief Complaint: Doing well Chief Complaint    Follow-up; Medication Refill     Visit Diagnosis:     ICD-9-CM ICD-10-CM   1. Major depressive disorder, recurrent episode, moderate (HCC) 296.32 F33.1   2. Social anxiety disorder 300.23 F40.10     Past Medical History:  Past Medical History  Diagnosis Date  . Anxiety   . Depression   . History of kidney stones   . Microscopic hematuria     Past Surgical History  Procedure Laterality Date  . Breast surgery      Implants  . Mandible fracture surgery    . Wisdom teeth surgery     Family History:  Family History  Problem Relation Age of Onset  . Kidney disease Neg Hx   . Bladder Cancer Neg Hx    Social History:  Social History   Social History  . Marital Status: Single    Spouse Name: N/A  . Number of Children: N/A  . Years of Education: N/A   Social History Main Topics  . Smoking status: Current Every Day Smoker --  0.50 packs/day    Types: E-cigarettes, Cigarettes    Start date: 06/28/2000  . Smokeless tobacco: Never Used  . Alcohol Use: 0.0 oz/week    0 Standard drinks or equivalent per week     Comment: special occ./ social  . Drug Use: No  . Sexual Activity: Yes    Birth Control/ Protection: None   Other Topics Concern  . None   Social History Narrative   Additional History:   Assessment:   Musculoskeletal: Strength & Muscle Tone: within normal limits Gait & Station: normal Patient leans: N/A  Psychiatric Specialty Exam: Anxiety Patient reports no insomnia, nervous/anxious behavior or suicidal ideas.    Depression        Associated symptoms include does not have insomnia and no suicidal ideas.  Past medical history includes anxiety.     Review of Systems  Psychiatric/Behavioral: Negative for depression, suicidal ideas, hallucinations, memory loss and substance abuse. The patient is not nervous/anxious and does not have insomnia.   All other systems reviewed and are negative.   Blood pressure 118/68, pulse 100, temperature 97.8 F (36.6 C), temperature source Tympanic, height 5\' 6"  (1.676 m), weight 132 lb 3.2 oz (59.966 kg), SpO2 98 %.Body mass index is 21.35 kg/(m^2).  General Appearance: Neat and  Well Groomed  Eye Contact:  Good  Speech:  Normal Rate  Volume:  Normal  Mood:  Better  Affect:  Not tearful as in the last appointment, constricted  Thought Process:  Linear and Logical  Orientation:  Full (Time, Place, and Person)  Thought Content:  Negative  Suicidal Thoughts:  No  Homicidal Thoughts:  No  Memory:  Immediate;   Good Recent;   Good Remote;   Good  Judgement:  Good  Insight:  Good  Psychomotor Activity:  Negative  Concentration:  Good  Recall:  Good  Fund of Knowledge: Good  Language: Good  Akathisia:  Negative  Handed:  Right unknown  AIMS (if indicated):  Not done  Assets:  Communication Skills Desire for Improvement Social  Support Vocational/Educational  ADL's:  Intact  Cognition: WNL  Sleep:  good   Is the patient at risk to self?  No. Has the patient been a risk to self in the past 6 months?  No. Has the patient been a risk to self within the distant past?  No. Is the patient a risk to others?  No. Has the patient been a risk to others in the past 6 months?  No. Has the patient been a risk to others within the distant past?  No.  Current Medications: Current Outpatient Prescriptions  Medication Sig Dispense Refill  . ALPRAZolam (XANAX) 0.5 MG tablet Take 1 tablet (0.5 mg total) by mouth 2 (two) times daily as needed for anxiety. 180 tablet 0  . buPROPion (WELLBUTRIN SR) 150 MG 12 hr tablet Take 1 tablet (150 mg total) by mouth 2 (two) times daily. 180 tablet 1  . cetirizine (ZYRTEC) 10 MG tablet Take 10 mg by mouth daily.    Marland Kitchen FLUoxetine (PROZAC) 40 MG capsule Take 1 capsule (40 mg total) by mouth daily. 90 capsule 1  . fluticasone (FLONASE) 50 MCG/ACT nasal spray Place 2 sprays into both nostrils daily.    Marland Kitchen ibuprofen (ADVIL,MOTRIN) 600 MG tablet Take 1 tablet (600 mg total) by mouth every 8 (eight) hours as needed. 15 tablet 0  . medroxyPROGESTERone (DEPO-PROVERA) 150 MG/ML injection Inject into the muscle.    . Multiple Vitamins-Minerals (MULTIVITAMIN GUMMIES ADULT) CHEW Chew by mouth.     No current facility-administered medications for this visit.    Medical Decision Making:  Established Problem, Stable/Improving (1)  Treatment Plan Summary:Medication management and Plan we will change her from the   Major depressive disorder, recurrent, moderate continue her Wellbutrin SR  150 mg twice a day and Prozac 40 mg daily.  Social Anxiety Disorder-alprazolam .5 mg twice daily as needed for anxiety. Patient states she is using this about once per week.  Patient is aware that I will be departing the clinic in February but that she would transition to another provider within this clinic. The patient  will follow up in 3 month. She's been encouraged call with any questions or concerns prior to her next appointment.    Wallace Going 12/31/2015, 2:42 PM

## 2015-12-31 NOTE — Telephone Encounter (Signed)
rx faxed and confirmed xanax .5mg  [facility] .   id #[id number] order # [id number]

## 2016-03-30 ENCOUNTER — Ambulatory Visit (INDEPENDENT_AMBULATORY_CARE_PROVIDER_SITE_OTHER): Payer: BLUE CROSS/BLUE SHIELD | Admitting: Psychiatry

## 2016-03-30 ENCOUNTER — Encounter: Payer: Self-pay | Admitting: Psychiatry

## 2016-03-30 VITALS — BP 118/70 | HR 108 | Temp 98.7°F | Ht 66.0 in | Wt 136.4 lb

## 2016-03-30 DIAGNOSIS — F4010 Social phobia, unspecified: Secondary | ICD-10-CM

## 2016-03-30 DIAGNOSIS — F331 Major depressive disorder, recurrent, moderate: Secondary | ICD-10-CM | POA: Insufficient documentation

## 2016-03-30 MED ORDER — FLUOXETINE HCL 40 MG PO CAPS
40.0000 mg | ORAL_CAPSULE | Freq: Every day | ORAL | Status: DC
Start: 2016-03-30 — End: 2016-08-25

## 2016-03-30 MED ORDER — BUPROPION HCL ER (SR) 150 MG PO TB12: 150.0000 mg | ORAL_TABLET | Freq: Two times a day (BID) | ORAL | Status: DC

## 2016-03-30 NOTE — Progress Notes (Signed)
Patient ID: Janice Gray, female   DOB: 06/11/1981, 35 y.o.   MRN: [id number] [facility]  03/30/2016 3:10 PM Janice BrideLiana Gray  MRN:  [id number]   Subjective: Patient presents for follow-up of her major depressive disorder and social anxiety disorder. Patient was previously seen by Dr. Mayford KnifeWilliams and this is the first visit for this patient with this clinician. She reports doing quite well on the combination of the Wellbutrin and Prozac. States that she is to eat well and sleep well. Denies any mood symptoms. States that once in a while for increased anxiety she takes a Xanax about once a month. She works at [employer] 40 hours a week at the receiving end and enjoys her job. Reports her children are doing well except for her stepson who had ADHD and is in treatment and has improved. Denies any mood symptoms and denies any suicidal thoughts.   Chief Complaint: Doing well Chief Complaint    Follow-up; Medication Refill     Visit Diagnosis:     ICD-9-CM ICD-10-CM   1. Major depressive disorder, recurrent episode, moderate (HCC) 296.32 F33.1   2. Social anxiety disorder 300.23 F40.10     Past Medical History:  Past Medical History  Diagnosis Date  . Anxiety   . Depression   . History of kidney stones   . Microscopic hematuria     Past Surgical History  Procedure Laterality Date  . Breast surgery      Implants  . Mandible fracture surgery    . Wisdom teeth surgery     Family History:  Family History  Problem Relation Age of Onset  . Kidney disease Neg Hx   . Bladder Cancer Neg Hx    Social History:  Social History   Social History  . Marital Status: Single    Spouse Name: N/A  . Number of Children: N/A  . Years of Education: N/A   Social History Main Topics  . Smoking status: Current Every Day Smoker -- 0.50 packs/day    Types: E-cigarettes, Cigarettes    Start date: 06/28/2000  . Smokeless tobacco: Never Used  . Alcohol Use: 0.0 oz/week    0 Standard  drinks or equivalent per week     Comment: special occ./ social  . Drug Use: No  . Sexual Activity: Yes    Birth Control/ Protection: None   Other Topics Concern  . None   Social History Narrative   Additional History:   Assessment:   Musculoskeletal: Strength & Muscle Tone: within normal limits Gait & Station: normal Patient leans: N/A  Psychiatric Specialty Exam: Anxiety Patient reports no insomnia, nervous/anxious behavior or suicidal ideas.    Depression        Associated symptoms include does not have insomnia and no suicidal ideas.  Past medical history includes anxiety.     Review of Systems  Psychiatric/Behavioral: Negative for depression, suicidal ideas, hallucinations, memory loss and substance abuse. The patient is not nervous/anxious and does not have insomnia.   All other systems reviewed and are negative.   Blood pressure 118/70, pulse 108, temperature 98.7 F (37.1 C), temperature source Tympanic, height 5\' 6"  (1.676 m), weight 136 lb 6.4 oz (61.871 kg), SpO2 96 %.Body mass index is 22.03 kg/(m^2).  General Appearance: Neat and Well Groomed  Eye Contact:  Good  Speech:  Normal Rate  Volume:  Normal  Mood:  Better  Affect:  congruent  Thought Process:  Linear and Logical  Orientation:  Full (  Time, Place, and Person)  Thought Content:  Negative  Suicidal Thoughts:  No  Homicidal Thoughts:  No  Memory:  Immediate;   Good Recent;   Good Remote;   Good  Judgement:  Good  Insight:  Good  Psychomotor Activity:  Negative  Concentration:  Good  Recall:  Good  Fund of Knowledge: Good  Language: Good  Akathisia:  Negative  Handed:  Right unknown  AIMS (if indicated):  Not done  Assets:  Communication Skills Desire for Improvement Social Support Vocational/Educational  ADL's:  Intact  Cognition: WNL  Sleep:  good   Is the patient at risk to self?  No. Has the patient been a risk to self in the past 6 months?  No. Has the patient been a risk to  self within the distant past?  No. Is the patient a risk to others?  No. Has the patient been a risk to others in the past 6 months?  No. Has the patient been a risk to others within the distant past?  No.  Current Medications: Current Outpatient Prescriptions  Medication Sig Dispense Refill  . ALPRAZolam (XANAX) 0.5 MG tablet Take 1 tablet (0.5 mg total) by mouth 2 (two) times daily as needed for anxiety. 180 tablet 0  . buPROPion (WELLBUTRIN SR) 150 MG 12 hr tablet Take 1 tablet (150 mg total) by mouth 2 (two) times daily. 180 tablet 1  . cetirizine (ZYRTEC) 10 MG tablet Take 10 mg by mouth daily.    Marland Kitchen FLUoxetine (PROZAC) 40 MG capsule Take 1 capsule (40 mg total) by mouth daily. 90 capsule 1  . fluticasone (FLONASE) 50 MCG/ACT nasal spray Place 2 sprays into both nostrils daily.    Marland Kitchen ibuprofen (ADVIL,MOTRIN) 600 MG tablet Take 1 tablet (600 mg total) by mouth every 8 (eight) hours as needed. 15 tablet 0  . medroxyPROGESTERone (DEPO-PROVERA) 150 MG/ML injection Inject into the muscle.    . Multiple Vitamins-Minerals (MULTIVITAMIN GUMMIES ADULT) CHEW Chew by mouth.     No current facility-administered medications for this visit.    Medical Decision Making:  Established Problem, Stable/Improving (1)  Treatment Plan Summary:Medication management and Plan we will change her from the   Major depressive disorder, recurrent, moderate continue her Wellbutrin SR  150 mg twice a day and Prozac 40 mg daily.  Social Anxiety Disorder-alprazolam .5  as needed for anxiety. Patient states she is using this about once per month.   The patient will follow up in 3 month. She's been encouraged call with any questions or concerns prior to her next appointment.    Filipe Greathouse 03/30/2016, 3:10 PM

## 2016-06-29 ENCOUNTER — Ambulatory Visit: Payer: BLUE CROSS/BLUE SHIELD | Admitting: Psychiatry

## 2016-08-02 DIAGNOSIS — J301 Allergic rhinitis due to pollen: Secondary | ICD-10-CM | POA: Insufficient documentation

## 2016-08-25 ENCOUNTER — Encounter: Payer: Self-pay | Admitting: Psychiatry

## 2016-08-25 ENCOUNTER — Ambulatory Visit (INDEPENDENT_AMBULATORY_CARE_PROVIDER_SITE_OTHER): Payer: BLUE CROSS/BLUE SHIELD | Admitting: Psychiatry

## 2016-08-25 VITALS — BP 109/68 | HR 94 | Temp 98.7°F | Ht 66.0 in | Wt 139.0 lb

## 2016-08-25 DIAGNOSIS — F4010 Social phobia, unspecified: Secondary | ICD-10-CM | POA: Diagnosis not present

## 2016-08-25 DIAGNOSIS — F331 Major depressive disorder, recurrent, moderate: Secondary | ICD-10-CM

## 2016-08-25 MED ORDER — BUPROPION HCL ER (XL) 450 MG PO TB24: 450.0000 mg | ORAL_TABLET | ORAL | 2 refills | Status: DC

## 2016-08-25 MED ORDER — FLUOXETINE HCL 40 MG PO CAPS: 40.0000 mg | ORAL_CAPSULE | Freq: Every day | ORAL | 1 refills | Status: DC

## 2016-08-25 NOTE — Progress Notes (Signed)
Patient ID: Janice Gray, female   DOB: January 03, 1981, 35 y.o.   MRN: [id number] [facility] MD/PA/NP OP Progress Note  08/25/2016 3:29 PM Janice Gray  MRN:  [id number]   Subjective: Patient presents for follow-up of her major depressive disorder and social anxiety disorder. Reports being more depressed lately. States her anxiety has increased lately. States her step son has aDHD and his behaviors stress her. States she has been emotional more lately.  Reports okay appetite, states she is sleeping okay but feels tired all the time.  Denies any suicidal thoughts.   Chief Complaint: More depressed and anxious lately  Chief Complaint    Follow-up; Medication Refill     Visit Diagnosis:     ICD-9-CM ICD-10-CM   1. Major depressive disorder, recurrent episode, moderate (HCC) 296.32 F33.1   2. Social anxiety disorder 300.23 F40.10     Past Medical History:  Past Medical History:  Diagnosis Date  . Anxiety   . Depression   . History of kidney stones   . Microscopic hematuria     Past Surgical History:  Procedure Laterality Date  . BREAST SURGERY     Implants  . MANDIBLE FRACTURE SURGERY    . Wisdom Teeth Surgery     Family History:  Family History  Problem Relation Age of Onset  . Kidney disease Neg Hx   . Bladder Cancer Neg Hx    Social History:  Social History   Social History  . Marital status: Single    Spouse name: N/A  . Number of children: N/A  . Years of education: N/A   Social History Main Topics  . Smoking status: Current Every Day Smoker    Packs/day: 0.50    Types: E-cigarettes, Cigarettes    Start date: 06/28/2000  . Smokeless tobacco: Never Used  . Alcohol use 0.0 oz/week     Comment: special occ./ social  . Drug use: No  . Sexual activity: Yes    Birth control/ protection: None   Other Topics Concern  . None   Social History Narrative  . None   Additional History:   Assessment:   Musculoskeletal: Strength & Muscle Tone: within normal limits Gait  & Station: normal Patient leans: N/A  Psychiatric Specialty Exam: Anxiety  Patient reports no insomnia, nervous/anxious behavior or suicidal ideas.    Depression         Associated symptoms include does not have insomnia and no suicidal ideas.  Past medical history includes anxiety.   Medication Refill     Review of Systems  Psychiatric/Behavioral: Negative for depression, hallucinations, memory loss, substance abuse and suicidal ideas. The patient is not nervous/anxious and does not have insomnia.   All other systems reviewed and are negative.   Blood pressure 109/68, pulse 94, temperature 98.7 F (37.1 C), temperature source Oral, height 5\' 6"  (1.676 m), weight 139 lb (63 kg).Body mass index is 22.44 kg/m.  General Appearance: Neat and Well Groomed  Eye Contact:  Good  Speech:  Normal Rate  Volume:  Normal  Mood:  down  Affect:  congruent  Thought Process:  Linear and Logical  Orientation:  Full (Time, Place, and Person)  Thought Content:  Negative  Suicidal Thoughts:  No  Homicidal Thoughts:  No  Memory:  Immediate;   Good Recent;   Good Remote;   Good  Judgement:  Good  Insight:  Good  Psychomotor Activity:  Negative  Concentration:  Good  Recall:  Good  Fund of Knowledge: Good  Language: Good  Akathisia:  Negative  Handed:  Right unknown  AIMS (if indicated):  Not done  Assets:  Communication Skills Desire for Improvement Social Support Vocational/Educational  ADL's:  Intact  Cognition: WNL  Sleep:  ok   Is the patient at risk to self?  No. Has the patient been a risk to self in the past 6 months?  No. Has the patient been a risk to self within the distant past?  No. Is the patient a risk to others?  No. Has the patient been a risk to others in the past 6 months?  No. Has the patient been a risk to others within the distant past?  No.  Current Medications: Current Outpatient Prescriptions  Medication Sig Dispense Refill  . ALPRAZolam (XANAX) 0.5 MG  tablet Take 1 tablet (0.5 mg total) by mouth 2 (two) times daily as needed for anxiety. 180 tablet 0  . buPROPion (WELLBUTRIN SR) 150 MG 12 hr tablet Take 1 tablet (150 mg total) by mouth 2 (two) times daily. 180 tablet 1  . cetirizine (ZYRTEC) 10 MG tablet Take 10 mg by mouth daily.    Marland Kitchen. FLUoxetine (PROZAC) 40 MG capsule Take 1 capsule (40 mg total) by mouth daily. 90 capsule 1  . fluticasone (FLONASE) 50 MCG/ACT nasal spray Place 2 sprays into both nostrils daily.    Marland Kitchen. ibuprofen (ADVIL,MOTRIN) 600 MG tablet Take 1 tablet (600 mg total) by mouth every 8 (eight) hours as needed. 15 tablet 0  . medroxyPROGESTERone (DEPO-PROVERA) 150 MG/ML injection Inject into the muscle.    . Multiple Vitamins-Minerals (MULTIVITAMIN GUMMIES ADULT) CHEW Chew by mouth.     No current facility-administered medications for this visit.     Medical Decision Making:  Established Problem, Stable/Improving (1)  Treatment Plan Summary:Medication management and Plan we will change her from the   Major depressive disorder, recurrent, moderate  Increase Wellbutrin to 450mg  daily. Continue Prozac at 40mg  po qd.  Social Anxiety Disorder-alprazolam .5  as needed for anxiety. Patient states she is using this about once per month.   The patient will follow up in 1 month. She's been encouraged call with any questions or concerns prior to her next appointment.    Adrine Hayworth 08/25/2016, 3:29 PM

## 2016-09-19 ENCOUNTER — Ambulatory Visit: Payer: BLUE CROSS/BLUE SHIELD | Admitting: Psychiatry

## 2016-11-08 ENCOUNTER — Encounter: Payer: Self-pay | Admitting: *Deleted

## 2016-11-08 NOTE — Discharge Instructions (Signed)
[facility] °[facility] °ENDOSCOPIC SINUS SURGERY °[facility] ° °What is Functional Endoscopic Sinus Surgery? ° The Surgery involves making the natural openings of the sinuses larger by removing the bony partitions that separate the sinuses from the nasal cavity.  The natural sinus lining is preserved as much as possible to allow the sinuses to resume normal function after the surgery.  In some patients nasal polyps (excessively swollen lining of the sinuses) may be removed to relieve obstruction of the sinus openings.  The surgery is performed through the nose using lighted scopes, which eliminates the need for incisions on the face.  A septoplasty is a different procedure which is sometimes performed with sinus surgery.  It involves straightening the boy partition that separates the two sides of your nose.  A crooked or deviated septum may need repair if is obstructing the sinuses or nasal airflow.  Turbinate reduction is also often performed during sinus surgery.  The turbinates are bony proturberances from the side walls of the nose which swell and can obstruct the nose in patients with sinus and allergy problems.  Their size can be surgically reduced to help relieve nasal obstruction. ° °What Can Sinus Surgery Do For Me? ° Sinus surgery can reduce the frequency of sinus infections requiring antibiotic treatment.  This can provide improvement in nasal congestion, post-nasal drainage, facial pressure and nasal obstruction.  Surgery will NOT prevent you from ever having an infection again, so it usually only for patients who get infections 4 or more times yearly requiring antibiotics, or for infections that do not clear with antibiotics.  It will not cure nasal allergies, so patients with allergies may still require medication to treat their allergies after surgery. Surgery may improve headaches related to sinusitis, however, some people will continue to  require medication to control sinus headaches related to allergies.  Surgery will do nothing for other forms of headache (migraine, tension or cluster). ° °What Are the Risks of Endoscopic Sinus Surgery? ° Current techniques allow surgery to be performed safely with little risk, however, there are rare complications that patients should be aware of.  Because the sinuses are located around the eyes, there is risk of eye injury, including blindness, though again, this would be quite rare. This is usually a result of bleeding behind the eye during surgery, which puts the vision oat risk, though there are treatments to protect the vision and prevent permanent disrupted by surgery causing a leak of the spinal fluid that surrounds the brain.  More serious complications would include bleeding inside the brain cavity or damage to the brain.  Again, all of these complications are uncommon, and spinal fluid leaks can be safely managed surgically if they occur.  The most common complication of sinus surgery is bleeding from the nose, which may require packing or cauterization of the nose.  Continued sinus have polyps may experience recurrence of the polyps requiring revision surgery.  Alterations of sense of smell or injury to the tear ducts are also rare complications.  ° °What is the Surgery Like, and what is the Recovery? ° The Surgery usually takes a couple of hours to perform, and is usually performed under a general anesthetic (completely asleep).  Patients are usually discharged home after a couple of hours.  Sometimes during surgery it is necessary to pack the nose to control bleeding, and the packing is left in place for 24 - 48 hours, and removed by your surgeon.    If a septoplasty was performed during the procedure, there is often a splint placed which must be removed after 5-7 days.   °Discomfort: Pain is usually mild to moderate, and can be controlled by prescription pain medication or acetaminophen (Tylenol).   Aspirin, Ibuprofen (Advil, Motrin), or Naprosyn (Aleve) should be avoided, as they can cause increased bleeding.  Most patients feel sinus pressure like they have a bad head cold for several days.  Sleeping with your head elevated can help reduce swelling and facial pressure, as can ice packs over the face.  A humidifier may be helpful to keep the mucous and blood from drying in the nose.  ° °Diet: There are no specific diet restrictions, however, you should generally start with clear liquids and a light diet of bland foods because the anesthetic can cause some nausea.  Advance your diet depending on how your stomach feels.  Taking your pain medication with food will often help reduce stomach upset which pain medications can cause. ° °Nasal Saline Irrigation: It is important to remove blood clots and dried mucous from the nose as it is healing.  This is done by having you irrigate the nose at least 3 - 4 times daily with a salt water solution.  We recommend using NeilMed Sinus Rinse (available at the drug store).  Fill the squeeze bottle with the solution, bend over a sink, and insert the tip of the squeeze bottle into the nose ½ of an inch.  Point the tip of the squeeze bottle towards the inside corner of the eye on the same side your irrigating.  Squeeze the bottle and gently irrigate the nose.  If you bend forward as you do this, most of the fluid will flow back out of the nose, instead of down your throat.   The solution should be warm, near body temperature, when you irrigate.   Each time you irrigate, you should use a full squeeze bottle.  ° °Note that if you are instructed to use Nasal Steroid Sprays at any time after your surgery, irrigate with saline BEFORE using the steroid spray, so you do not wash it all out of the nose. °Another product, Nasal Saline Gel (such as AYR Nasal Saline Gel) can be applied in each nostril 3 - 4 times daily to moisture the nose and reduce scabbing or crusting. ° °Bleeding:   Bloody drainage from the nose can be expected for several days, and patients are instructed to irrigate their nose frequently with salt water to help remove mucous and blood clots.  The drainage may be dark red or brown, though some fresh blood may be seen intermittently, especially after irrigation.  Do not blow you nose, as bleeding may occur. If you must sneeze, keep your mouth open to allow air to escape through your mouth. ° °If heavy bleeding occurs: Irrigate the nose with saline to rinse out clots, then spray the nose 3 - 4 times with Afrin Nasal Decongestant Spray.  The spray will constrict the blood vessels to slow bleeding.  Pinch the lower half of your nose shut to apply pressure, and lay down with your head elevated.  Ice packs over the nose may help as well. If bleeding persists despite these measures, you should notify your doctor.  Do not use the Afrin routinely to control nasal congestion after surgery, as it can result in worsening congestion and may affect healing.  ° °Activity: Return to work varies among patients. Most patients will be out of   work at least 5 - 7 days to recover.  Patient may return to work after they are off of narcotic pain medication, and feeling well enough to perform the functions of their job.  Patients must avoid heavy lifting (over 10 pounds) or strenuous physical for 2 weeks after surgery, so your employer may need to assign you to light duty, or keep you out of work longer if light duty is not possible.  NOTE: you should not drive, operate dangerous machinery, do any mentally demanding tasks or make any important legal or financial decisions while on narcotic pain medication and recovering from the general anesthetic.  °  °Call Your Doctor Immediately if You Have Any of the Following: °1. Bleeding that you cannot control with the above measures °2. Loss of vision, double vision, bulging of the eye or black eyes. °3. Fever over 101 degrees °4. Neck stiffness with severe  headache, fever, nausea and change in mental state. °You are always encourage to call anytime with concerns, however, please call with requests for pain medication refills during office hours. ° °Office Endoscopy: During follow-up visits your doctor will remove any packing or splints that may have been placed and evaluate and clean your sinuses endoscopically.  Topical anesthetic will be used to make this as comfortable as possible, though you may want to take your pain medication prior to the visit.  How often this will need to be done varies from patient to patient.  After complete recovery from the surgery, you may need follow-up endoscopy from time to time, particularly if there is concern of recurrent infection or nasal polyps. ° ° °General Anesthesia, Adult, Care After °These instructions provide you with information about caring for yourself after your procedure. Your health care provider may also give you more specific instructions. Your treatment has been planned according to current medical practices, but problems sometimes occur. Call your health care provider if you have any problems or questions after your procedure. °What can I expect after the procedure? °After the procedure, it is common to have: °· Vomiting. °· A sore throat. °· Mental slowness. °It is common to feel: °· Nauseous. °· Cold or shivery. °· Sleepy. °· Tired. °· Sore or achy, even in parts of your body where you did not have surgery. °Follow these instructions at home: °For at least 24 hours after the procedure: °· Do not: °¨ Participate in activities where you could fall or become injured. °¨ Drive. °¨ Use heavy machinery. °¨ Drink alcohol. °¨ Take sleeping pills or medicines that cause drowsiness. °¨ Make important decisions or sign legal documents. °¨ Take care of children on your own. °· Rest. °Eating and drinking °· If you vomit, drink water, juice, or soup when you can drink without vomiting. °· Drink enough fluid to keep your  urine clear or pale yellow. °· Make sure you have little or no nausea before eating solid foods. °· Follow the diet recommended by your health care provider. °General instructions °· Have a responsible adult stay with you until you are awake and alert. °· Return to your normal activities as told by your health care provider. Ask your health care provider what activities are safe for you. °· Take over-the-counter and prescription medicines only as told by your health care provider. °· If you smoke, do not smoke without supervision. °· Keep all follow-up visits as told by your health care provider. This is important. °Contact a health care provider if: °· You continue to have nausea   or vomiting at home, and medicines are not helpful. °· You cannot drink fluids or start eating again. °· You cannot urinate after 8-12 hours. °· You develop a skin rash. °· You have fever. °· You have increasing redness at the site of your procedure. °Get help right away if: °· You have difficulty breathing. °· You have chest pain. °· You have unexpected bleeding. °· You feel that you are having a life-threatening or urgent problem. °This information is not intended to replace advice given to you by your health care provider. Make sure you discuss any questions you have with your health care provider. °Document Released: 03/13/2001 Document Revised: 05/09/2016 Document Reviewed: 11/19/2015 °Elsevier Interactive Patient Education © 2017 [employer] ° °

## 2016-11-17 ENCOUNTER — Ambulatory Visit: Payer: BLUE CROSS/BLUE SHIELD | Admitting: Anesthesiology

## 2016-11-17 ENCOUNTER — Encounter
Admission: RE | Disposition: A | Discharge status: Home / Self Care | Payer: Self-pay | Source: Ambulatory Visit | Attending: Otolaryngology

## 2016-11-17 ENCOUNTER — Ambulatory Visit
Admission: RE | Admit: 2016-11-17 | Discharge: 2016-11-17 | Disposition: A | Discharge status: Home / Self Care | Payer: BLUE CROSS/BLUE SHIELD | Source: Ambulatory Visit | Attending: Otolaryngology | Admitting: Otolaryngology

## 2016-11-17 DIAGNOSIS — F17200 Nicotine dependence, unspecified, uncomplicated: Secondary | ICD-10-CM | POA: Insufficient documentation

## 2016-11-17 DIAGNOSIS — J343 Hypertrophy of nasal turbinates: Secondary | ICD-10-CM | POA: Insufficient documentation

## 2016-11-17 DIAGNOSIS — J342 Deviated nasal septum: Secondary | ICD-10-CM | POA: Insufficient documentation

## 2016-11-17 HISTORY — PX: SEPTOPLASTY: SHX2393

## 2016-11-17 HISTORY — DX: Headache: R51

## 2016-11-17 HISTORY — PX: ENDOSCOPIC CONCHA BULLOSA RESECTION: SHX6395

## 2016-11-17 HISTORY — DX: Motion sickness, initial encounter: T75.3XXA

## 2016-11-17 HISTORY — DX: Headache, unspecified: R51.9

## 2016-11-17 HISTORY — DX: Presence of spectacles and contact lenses: Z97.3

## 2016-11-17 HISTORY — PX: TURBINATE REDUCTION: SHX6157

## 2016-11-17 SURGERY — SEPTOPLASTY, NOSE
Anesthesia: General | Site: Nose | Laterality: Right | Wound class: Clean Contaminated

## 2016-11-17 MED ORDER — SCOPOLAMINE 1 MG/3DAYS TD PT72
1.0000 | MEDICATED_PATCH | Freq: Once | TRANSDERMAL | Status: DC
  Administered 2016-11-17: 1.5 mg via TRANSDERMAL

## 2016-11-17 MED ORDER — MIDAZOLAM HCL 5 MG/5ML IJ SOLN
INTRAMUSCULAR | Status: DC | PRN
  Administered 2016-11-17: 2 mg via INTRAVENOUS

## 2016-11-17 MED ORDER — PROPOFOL 10 MG/ML IV BOLUS
INTRAVENOUS | Status: DC | PRN
  Administered 2016-11-17: 150 mg via INTRAVENOUS

## 2016-11-17 MED ORDER — LACTATED RINGERS IV SOLN
INTRAVENOUS | Status: DC
  Administered 2016-11-17: 08:00:00 via INTRAVENOUS

## 2016-11-17 MED ORDER — FENTANYL CITRATE (PF) 100 MCG/2ML IJ SOLN
25.0000 ug | INTRAMUSCULAR | Status: DC | PRN
  Administered 2016-11-17 (×2): 25 ug via INTRAVENOUS

## 2016-11-17 MED ORDER — OXYCODONE HCL 5 MG/5ML PO SOLN: 5.0000 mg | Freq: Once | ORAL | Status: AC | PRN

## 2016-11-17 MED ORDER — SUCCINYLCHOLINE CHLORIDE 20 MG/ML IJ SOLN
INTRAMUSCULAR | Status: DC | PRN
  Administered 2016-11-17: 100 mg via INTRAVENOUS

## 2016-11-17 MED ORDER — OXYMETAZOLINE HCL 0.05 % NA SOLN
2.0000 | Freq: Once | NASAL | Status: AC
  Administered 2016-11-17: 2 via NASAL

## 2016-11-17 MED ORDER — PHENYLEPHRINE HCL 0.5 % NA SOLN
NASAL | Status: DC | PRN
  Administered 2016-11-17: 30 mL via TOPICAL

## 2016-11-17 MED ORDER — LIDOCAINE HCL (CARDIAC) 20 MG/ML IV SOLN
INTRAVENOUS | Status: DC | PRN
  Administered 2016-11-17: 50 mg via INTRAVENOUS

## 2016-11-17 MED ORDER — FENTANYL CITRATE (PF) 100 MCG/2ML IJ SOLN
INTRAMUSCULAR | Status: DC | PRN
  Administered 2016-11-17: 100 ug via INTRAVENOUS

## 2016-11-17 MED ORDER — CEFAZOLIN SODIUM-DEXTROSE 2-4 GM/100ML-% IV SOLN
2.0000 g | Freq: Once | INTRAVENOUS | Status: AC
  Administered 2016-11-17: 2 g via INTRAVENOUS

## 2016-11-17 MED ORDER — ONDANSETRON HCL 4 MG/2ML IJ SOLN: 4.0000 mg | Freq: Once | INTRAMUSCULAR | Status: DC | PRN

## 2016-11-17 MED ORDER — ACETAMINOPHEN 10 MG/ML IV SOLN
1000.0000 mg | Freq: Once | INTRAVENOUS | Status: AC
  Administered 2016-11-17: 1000 mg via INTRAVENOUS

## 2016-11-17 MED ORDER — GLYCOPYRROLATE 0.2 MG/ML IJ SOLN
INTRAMUSCULAR | Status: DC | PRN
  Administered 2016-11-17: 0.2 mg via INTRAVENOUS

## 2016-11-17 MED ORDER — ONDANSETRON HCL 4 MG/2ML IJ SOLN
INTRAMUSCULAR | Status: DC | PRN
  Administered 2016-11-17: 4 mg via INTRAVENOUS

## 2016-11-17 MED ORDER — LIDOCAINE-EPINEPHRINE 1 %-1:100000 IJ SOLN
INTRAMUSCULAR | Status: DC | PRN
Start: 2016-11-17 — End: 2016-11-17
  Administered 2016-11-17: 2 mL
  Administered 2016-11-17: 3 mL

## 2016-11-17 MED ORDER — OXYCODONE HCL 5 MG PO TABS
5.0000 mg | ORAL_TABLET | Freq: Once | ORAL | Status: AC | PRN
  Administered 2016-11-17: 5 mg via ORAL

## 2016-11-17 MED ORDER — DEXAMETHASONE SODIUM PHOSPHATE 4 MG/ML IJ SOLN
INTRAMUSCULAR | Status: DC | PRN
  Administered 2016-11-17: 8 mg via INTRAVENOUS

## 2016-11-17 SURGICAL SUPPLY — 28 items
CANISTER SUCT 1200ML W/VALVE (MISCELLANEOUS) ×5 IMPLANT
CATH IV 18X1 1/4 SAFELET (CATHETERS) ×3 IMPLANT
COAGULATOR SUCT 8FR VV (MISCELLANEOUS) ×5 IMPLANT
DRAPE HEAD BAR (DRAPES) ×5 IMPLANT
GLOVE PI ULTRA LF STRL 7.5 (GLOVE) ×6 IMPLANT
GLOVE PI ULTRA NON LATEX 7.5 (GLOVE) ×6
IV CATH 18X1 1/4 SAFELET (CATHETERS)
KIT ROOM TURNOVER OR (KITS) ×5 IMPLANT
NDL ANESTHESIA 27G X 3.5 (NEEDLE) ×3 IMPLANT
NEEDLE ANESTHESIA  27G X 3.5 (NEEDLE)
NEEDLE ANESTHESIA 27G X 3.5 (NEEDLE) IMPLANT
NS IRRIG 500ML POUR BTL (IV SOLUTION) ×5 IMPLANT
PACK DRAPE NASAL/ENT (PACKS) ×5 IMPLANT
PACKING NASAL EPIS 4X2.4 XEROG (MISCELLANEOUS) IMPLANT
PAD GROUND ADULT SPLIT (MISCELLANEOUS) ×5 IMPLANT
PATTIES SURGICAL .5 X3 (DISPOSABLE) ×5 IMPLANT
SOL ANTI-FOG 6CC FOG-OUT (MISCELLANEOUS) ×3 IMPLANT
SOL FOG-OUT ANTI-FOG 6CC (MISCELLANEOUS) ×2
SPLINT NASAL SEPTAL BLV .50 ST (MISCELLANEOUS) ×5 IMPLANT
STRAP BODY AND KNEE 60X3 (MISCELLANEOUS) ×10 IMPLANT
SUT CHROMIC 3-0 (SUTURE) ×5
SUT CHROMIC 3-0 KS 27XMFL CR (SUTURE) ×3
SUT ETHILON 3-0 KS 30 BLK (SUTURE) ×5 IMPLANT
SUT PLAIN GUT 4-0 (SUTURE) ×5 IMPLANT
SUTURE CHRMC 3-0 KS 27XMFL CR (SUTURE) IMPLANT
SYR 3ML LL SCALE MARK (SYRINGE) ×5 IMPLANT
TOWEL OR 17X26 4PK STRL BLUE (TOWEL DISPOSABLE) ×5 IMPLANT
WATER STERILE IRR 250ML POUR (IV SOLUTION) IMPLANT

## 2016-11-17 NOTE — Op Note (Signed)
11/17/2016  10:03 AM   [id number]   Pre-Op Dx:  Deviated Nasal Septum, conchal bullosa right middle turbinate, Hypertrophic Inferior Turbinates  Post-op Dx: Same  Proc: Nasal Septoplasty, endoscopic trimming of right middle turbinate conchal bullosa, Bilateral Partial Reduction Inferior Turbinates   Surg:  Janice Gray  Anes:  GOT  EBL:  50 mL  Comp:  None  Findings: Patient septum was markedly deviated to the right at the anterior ethmoid plate. Some buckling at the vomer. Right inferior turbinate was larger than the left. The right middle turbinate was pinched anteriorly but then opened up into a conchal bullosa.  Procedure: With the patient in a comfortable supine position,  general orotracheal anesthesia was induced without difficulty.     The patient received preoperative Afrin spray for topical decongestion and vasoconstriction.  Intravenous prophylactic antibiotics were administered.  At an appropriate level, the patient was placed in a semi-sitting position.  Nasal vibrissae were trimmed.   1% Xylocaine with 1:100,000 epinephrine, 6 cc's, was infiltrated into the anterior floor of the nose, into the nasal spine region, into the membranous columella, and finally into the submucoperichondrial plane of the septum on both sides.  Several minutes were allowed for this to take effect.  Cottoniod pledgetts soaked in Afrin and 4% Xylocaine were placed into both nasal cavities and left while the patient was prepped and draped in the standard fashion.  The materials were removed from the nose and observed to be intact and correct in number.  The nose was inspected with a headlight and a 0 scope with the findings as described above.  A left Killian incision was sharply executed and carried down to the quadrangular cartilage. The mucoperichondrium was elelvated along the quadrangular plate back to the bony-cartilaginous junction. The mucoperiostium was then elevated along the ethmoid  plate and the vomer. The boney-catilaginous junction was then split with a freer elevator and the mucoperiosteum was elevated on the opposite side. The mucoperiosteum was then elevated along the maxillary crest as needed to expose the crooked bone of the crest.  Boney spurs of the vomer and maxillary crest were removed with Lenoria Chimeakahashi forceps.  The cartilaginous plate was trimmed along its posterior and inferior borders of about 2 mm of cartilage to free it up inferiorly. Some of the deviated ethmoid plate was then fractured and removed with Takahashi forceps to free up the posterior border of the quadrangular plate and allow it to swing back to the midline. The mucosal flaps were placed back into their anatomic position to allow visualization of the airways. The septum now sat in the midline with an improved airway.  A 3-0 Chromic suture on a Keith needle in used to anchor the inferior septum at the nasal spine with a through and through suture. The mucosal flaps are then sutured together using a through and through whip stitch of 4-0 Plain Gut with a mini-Keith needle. This was used to close the DustinKillian incision as well.   The 0 scope was then used to visualize both sides the nose again and the right middle turbinate was trimmed along its anterior inferior border with Gruenwald forceps. This had been sitting lateral up against the uncinate process. With a trimmed back a few can see into the conchal bullosa and the lateral wall of this was removed with small straight through biting forceps. Left cautery was used along the trimmed edge to help control bleeding. This now opened the middle meatus for better airflow and drainage  The  inferior turbinates were then inspected. An incision was created along the inferior aspect of the left inferior turbinate with removal of some of the inferior soft tissue and bone. Electrocautery was used to control bleeding in the area. The remaining turbinate was then  outfractured to open up the airway further. There was no significant bleeding noted. The right turbinate was then trimmed and outfractured in a similar fashion, although the right side required more work because the turbinate was larger.  The airways were then visualized and showed open passageways on both sides that were significantly improved compared to before surgery. There was no signifcant bleeding. Xerogel was placed around the right middle turbinate and provide. Nasal splints were applied to both sides of the septum using Xomed 0.85mm regular sized splints that were trimmed, and then held in position with a 3-0 Nylon through and through suture.  The patient was turned back over to anesthesia, and awakened, extubated, and taken to the PACU in satisfactory condition.  Dispo:   PACU to home  Plan: Ice, elevation, narcotic analgesia, steroid taper, and prophylactic antibiotics for the duration of indwelling nasal foreign bodies.  We will reevaluate the patient in the office in 6 days and remove the septal splints.  Return to work in 10 days, strenuous activities in two weeks.   Janice Gray 11/17/2016 10:03 AM

## 2016-11-17 NOTE — H&P (Signed)
  H&P has been reviewed and no changes necessary. To be downloaded later. 

## 2016-11-17 NOTE — Anesthesia Procedure Notes (Signed)
Procedure Name: Intubation Date/Time: 11/17/2016 9:08 AM Performed by: Andee PolesBUSH, Janice Gray Pre-anesthesia Checklist: Patient identified, Emergency Drugs available, Suction available, Patient being monitored and Timeout performed Patient Re-evaluated:Patient Re-evaluated prior to inductionOxygen Delivery Method: Circle system utilized Preoxygenation: Pre-oxygenation with 100% oxygen Intubation Type: IV induction Ventilation: Mask ventilation without difficulty Laryngoscope Size: Mac and 3 Grade View: Grade I Tube type: Oral Rae Tube size: 7.0 mm Number of attempts: 1 Placement Confirmation: ETT inserted through vocal cords under direct vision,  positive ETCO2 and breath sounds checked- equal and bilateral Tube secured with: Tape Dental Injury: Teeth and Oropharynx as per pre-operative assessment

## 2016-11-17 NOTE — Transfer of Care (Signed)
Immediate Anesthesia Transfer of Care Note  Patient: Retail bankerLiana Gray  Procedure(s) Performed: Procedure(s): SEPTOPLASTY (N/A) ENDOSCOPIC CONCHA BULLOSA RESECTION (Right) PARTIAL INFERIOR TURBINATE REDUCTION (Bilateral)  Patient Location: PACU  Anesthesia Type: General ETT  Level of Consciousness: awake, alert  and patient cooperative  Airway and Oxygen Therapy: Patient Spontanous Breathing and Patient connected to supplemental oxygen  Post-op Assessment: Post-op Vital signs reviewed, Patient's Cardiovascular Status Stable, Respiratory Function Stable, Patent Airway and No signs of Nausea or vomiting  Post-op Vital Signs: Reviewed and stable  Complications: No apparent anesthesia complications

## 2016-11-17 NOTE — Anesthesia Preprocedure Evaluation (Signed)
Anesthesia Evaluation  Patient identified by MRN, date of birth, ID band Patient awake    Reviewed: Allergy & Precautions, H&P , NPO status , Patient's Chart, lab work & pertinent test results  Airway Mallampati: II  TM Distance: >3 FB Neck ROM: full    Dental no notable dental hx.    Pulmonary Current Smoker,    Pulmonary exam normal        Cardiovascular Normal cardiovascular exam     Neuro/Psych PSYCHIATRIC DISORDERS    GI/Hepatic   Endo/Other    Renal/GU      Musculoskeletal   Abdominal   Peds  Hematology   Anesthesia Other Findings   Reproductive/Obstetrics                             Anesthesia Physical Anesthesia Plan  ASA: II  Anesthesia Plan: General ETT   Post-op Pain Management:    Induction:   Airway Management Planned:   Additional Equipment:   Intra-op Plan:   Post-operative Plan:   Informed Consent: I have reviewed the patients History and Physical, chart, labs and discussed the procedure including the risks, benefits and alternatives for the proposed anesthesia with the patient or authorized representative who has indicated his/her understanding and acceptance.     Plan Discussed with:   Anesthesia Plan Comments:         Anesthesia Quick Evaluation

## 2016-11-17 NOTE — Anesthesia Postprocedure Evaluation (Signed)
Anesthesia Post Note  Patient: Janice SlatteRetail bankerr  Procedure(s) Performed: Procedure(s) (LRB): SEPTOPLASTY (N/A) ENDOSCOPIC CONCHA BULLOSA RESECTION (Right) PARTIAL INFERIOR TURBINATE REDUCTION (Bilateral)  Patient location during evaluation: PACU Anesthesia Type: General Level of consciousness: awake and alert and oriented Pain management: satisfactory to patient Vital Signs Assessment: post-procedure vital signs reviewed and stable Respiratory status: spontaneous breathing, nonlabored ventilation and respiratory function stable Cardiovascular status: blood pressure returned to baseline and stable Postop Assessment: Adequate PO intake and No signs of nausea or vomiting Anesthetic complications: no    [name], [name]

## 2016-11-21 ENCOUNTER — Ambulatory Visit (INDEPENDENT_AMBULATORY_CARE_PROVIDER_SITE_OTHER): Payer: BLUE CROSS/BLUE SHIELD | Admitting: Psychiatry

## 2016-11-21 ENCOUNTER — Encounter: Payer: Self-pay | Admitting: Psychiatry

## 2016-11-21 VITALS — BP 95/65 | HR 103 | Temp 97.8°F | Ht 67.0 in | Wt 132.2 lb

## 2016-11-21 DIAGNOSIS — F331 Major depressive disorder, recurrent, moderate: Secondary | ICD-10-CM | POA: Diagnosis not present

## 2016-11-21 DIAGNOSIS — F4010 Social phobia, unspecified: Secondary | ICD-10-CM | POA: Diagnosis not present

## 2016-11-21 MED ORDER — FLUOXETINE HCL 20 MG PO CAPS: 60.0000 mg | ORAL_CAPSULE | Freq: Every day | ORAL | 1 refills | Status: DC

## 2016-11-21 NOTE — Progress Notes (Signed)
Patient ID: Janice BrideLiana Gray, female   DOB: 1980/12/26, 35 y.o.   MRN: [id number] [facility] OP Progress Note  11/21/2016 1:36 PM Janice Gray  MRN:  [id number]   Subjective: Patient presents for follow-up of her major depressive disorder and social anxiety disorder. Reports being unable to tolerate the increase in Wellbutrin. States that she felt very nauseous and had to go back to taking the 300 mg. Continues to be stressed and anxious. States all her kids are sick. States she feels better than before and doing better at her job.Reports okay appetite, states she is sleeping okay but feels tired all the time.  Denies any suicidal thoughts.  Chief Complaint: anxious  Chief Complaint    Follow-up; Medication Refill     Visit Diagnosis:     ICD-9-CM ICD-10-CM   1. Major depressive disorder, recurrent episode, moderate (HCC) 296.32 F33.1   2. Social anxiety disorder 300.23 F40.10     Past Medical History:  Past Medical History:  Diagnosis Date  . Anxiety   . Depression   . Headache    sinus  . History of kidney stones   . Microscopic hematuria   . Motion sickness    cars  . Wears contact lenses     Past Surgical History:  Procedure Laterality Date  . BREAST SURGERY     Implants  . ENDOSCOPIC CONCHA BULLOSA RESECTION Right 11/17/2016   Procedure: ENDOSCOPIC CONCHA BULLOSA RESECTION;  Surgeon: Vernie MurdersPaul Juengel, MD;  [facility];  Service: ENT;  Laterality: Right;  . MANDIBLE FRACTURE SURGERY    . SEPTOPLASTY N/A 11/17/2016   Procedure: SEPTOPLASTY;  Surgeon: Vernie MurdersPaul Juengel, MD;  [facility];  Service: ENT;  Laterality: N/A;  . TURBINATE REDUCTION Bilateral 11/17/2016   Procedure: PARTIAL INFERIOR TURBINATE REDUCTION;  Surgeon: Vernie MurdersPaul Juengel, MD;  [facility];  Service: ENT;  Laterality: Bilateral;  . Wisdom Teeth Surgery     Family History:  Family History  Problem Relation Age of Onset  . Kidney disease Neg Hx   . Bladder  Cancer Neg Hx    Social History:  Social History   Social History  . Marital status: Single    Spouse name: N/A  . Number of children: N/A  . Years of education: N/A   Social History Main Topics  . Smoking status: Current Every Day Smoker    Packs/day: 0.50    Years: 15.00    Types: E-cigarettes, Cigarettes    Start date: 06/28/2000  . Smokeless tobacco: Never Used  . Alcohol use 0.0 oz/week     Comment: special occ./ social  . Drug use: No  . Sexual activity: Yes    Birth control/ protection: None   Other Topics Concern  . None   Social History Narrative  . None   Additional History:   Assessment:   Musculoskeletal: Strength & Muscle Tone: within normal limits Gait & Station: normal Patient leans: N/A  Psychiatric Specialty Exam: Medication Refill   Anxiety  Patient reports no insomnia, nervous/anxious behavior or suicidal ideas.    Depression         Associated symptoms include does not have insomnia and no suicidal ideas.  Past medical history includes anxiety.     Review of Systems  Psychiatric/Behavioral: Negative for depression, hallucinations, memory loss, substance abuse and suicidal ideas. The patient is not nervous/anxious and does not have insomnia.   All other systems reviewed and are negative.   Blood pressure 95/65, pulse Marland Kitchen(!)  103, temperature 97.8 F (36.6 C), temperature source Oral, height 5\' 7"  (1.702 m), weight 132 lb 3.2 oz (60 kg).Body mass index is 20.71 kg/m.  General Appearance: Neat and Well Groomed  Eye Contact:  Good  Speech:  Normal Rate  Volume:  Normal  Mood:  Slightly better  Affect:  congruent  Thought Process:  Linear and Logical  Orientation:  Full (Time, Place, and Person)  Thought Content:  Negative  Suicidal Thoughts:  No  Homicidal Thoughts:  No  Memory:  Immediate;   Good Recent;   Good Remote;   Good  Judgement:  Good  Insight:  Good  Psychomotor Activity:  Negative  Concentration:  Good  Recall:  Good   Fund of Knowledge: Good  Language: Good  Akathisia:  Negative  Handed:  Right unknown  AIMS (if indicated):  Not done  Assets:  Communication Skills Desire for Improvement Social Support Vocational/Educational  ADL's:  Intact  Cognition: WNL  Sleep:  ok   Is the patient at risk to self?  No. Has the patient been a risk to self in the past 6 months?  No. Has the patient been a risk to self within the distant past?  No. Is the patient a risk to others?  No. Has the patient been a risk to others in the past 6 months?  No. Has the patient been a risk to others within the distant past?  No.  Current Medications: Current Outpatient Prescriptions  Medication Sig Dispense Refill  . ALPRAZolam (XANAX) 0.5 MG tablet Take 1 tablet (0.5 mg total) by mouth 2 (two) times daily as needed for anxiety. 180 tablet 0  . BuPROPion HCl ER, XL, 450 MG TB24 Take 450 mg by mouth every morning. 30 tablet 2  . FLUoxetine (PROZAC) 40 MG capsule Take 1 capsule (40 mg total) by mouth daily. 90 capsule 1  . fluticasone (FLONASE) 50 MCG/ACT nasal spray Place 2 sprays into both nostrils daily.    Marland Kitchen. ibuprofen (ADVIL,MOTRIN) 600 MG tablet Take 1 tablet (600 mg total) by mouth every 8 (eight) hours as needed. 15 tablet 0  . medroxyPROGESTERone (DEPO-PROVERA) 150 MG/ML injection Inject into the muscle.    . Multiple Vitamins-Minerals (MULTIVITAMIN GUMMIES ADULT) CHEW Chew by mouth.     No current facility-administered medications for this visit.     Medical Decision Making:  Established Problem, Stable/Improving (1)  Treatment Plan Summary:Medication management and Plan we will change her from the   Major depressive disorder, recurrent, moderate  Continue wellbutrin at 300mg  po qd. Increase  Prozac at 60mg  po qd.  Social Anxiety Disorder-alprazolam .5  as needed for anxiety. Patient states she is using this about once per month.   The patient will follow up in 3 month. She's been encouraged call with any  questions or concerns prior to her next appointment.    Mckinze Poirier 11/21/2016, 1:36 PM

## 2017-01-30 ENCOUNTER — Telehealth: Payer: Self-pay

## 2017-01-30 NOTE — Telephone Encounter (Signed)
received a request for a 90 day supply fo the bupropion xl 150mg  take 3 tablets by mouth every morning #270 with no additional refills.  but I noticed you have 450mg  take one every morning.

## 2017-01-30 NOTE — Telephone Encounter (Signed)
left message to find out which one patient is taking if it is the bupropion xl 150mg  take 3 or the 450mg  take 1.

## 2017-02-01 NOTE — Telephone Encounter (Signed)
called in rx for burpropion hcl 150mg  3 tablet in morning. no additonal refills, also oked refill for prozac 20mg  with no additional refills

## 2017-02-20 ENCOUNTER — Ambulatory Visit (INDEPENDENT_AMBULATORY_CARE_PROVIDER_SITE_OTHER): Payer: BLUE CROSS/BLUE SHIELD | Admitting: Psychiatry

## 2017-02-20 ENCOUNTER — Encounter: Payer: Self-pay | Admitting: Psychiatry

## 2017-02-20 VITALS — BP 99/64 | HR 96 | Temp 98.6°F | Wt 134.0 lb

## 2017-02-20 DIAGNOSIS — F4010 Social phobia, unspecified: Secondary | ICD-10-CM

## 2017-02-20 DIAGNOSIS — F331 Major depressive disorder, recurrent, moderate: Secondary | ICD-10-CM | POA: Diagnosis not present

## 2017-02-20 MED ORDER — FLUOXETINE HCL 20 MG PO CAPS: 60.0000 mg | ORAL_CAPSULE | Freq: Every day | ORAL | 2 refills | Status: DC

## 2017-02-20 MED ORDER — BUPROPION HCL ER (XL) 300 MG PO TB24: 300.0000 mg | ORAL_TABLET | ORAL | 2 refills | Status: DC

## 2017-02-20 NOTE — Progress Notes (Signed)
Patient ID: Janice BrideLiana Schuneman, female   DOB: Sep 17, 1981, 36 y.o.   [facility] [id number] [facility]  02/20/2017 2:13 PM Janice Gray  MRN:  [id number]   Subjective: Patient presents for follow-up of her major depressive disorder and social anxiety disorder. Doing good. She has tolerated the increase in Prozac to 60mg . Compliant with the wellbutrin at 300mg . States her mood is good but feels tired all the time. Her sleep is okay , reports waking up but goes back to sleep.Denies any suicidal thoughts.  Chief Complaint: doing ok  Chief Complaint    Follow-up; Medication Refill     Visit Diagnosis:     ICD-9-CM ICD-10-CM   1. Major depressive disorder, recurrent episode, moderate (HCC) 296.32 F33.1   2. Social anxiety disorder 300.23 F40.10     Past Medical History:  Past Medical History:  Diagnosis Date  . Anxiety   . Depression   . Headache    sinus  . History of kidney stones   . Microscopic hematuria   . Motion sickness    cars  . Wears contact lenses     Past Surgical History:  Procedure Laterality Date  . BREAST SURGERY     Implants  . ENDOSCOPIC CONCHA BULLOSA RESECTION Right 11/17/2016   Procedure: ENDOSCOPIC CONCHA BULLOSA RESECTION;  Surgeon: Vernie MurdersPaul Juengel, MD;  [facility];  Service: ENT;  Laterality: Right;  . MANDIBLE FRACTURE SURGERY    . SEPTOPLASTY N/A 11/17/2016   Procedure: SEPTOPLASTY;  Surgeon: Vernie MurdersPaul Juengel, MD;  [facility];  Service: ENT;  Laterality: N/A;  . TURBINATE REDUCTION Bilateral 11/17/2016   Procedure: PARTIAL INFERIOR TURBINATE REDUCTION;  Surgeon: Vernie MurdersPaul Juengel, MD;  [facility];  Service: ENT;  Laterality: Bilateral;  . Wisdom Teeth Surgery     Family History:  Family History  Problem Relation Age of Onset  . Kidney disease Neg Hx   . Bladder Cancer Neg Hx    Social History:  Social History   Social History  . Marital status: Single    Spouse name: N/A  . Number of  children: N/A  . Years of education: N/A   Social History Main Topics  . Smoking status: Current Every Day Smoker    Packs/day: 0.50    Years: 15.00    Types: E-cigarettes, Cigarettes    Start date: 06/28/2000  . Smokeless tobacco: Never Used  . Alcohol use 0.0 oz/week     Comment: special occ./ social  . Drug use: No  . Sexual activity: Yes    Birth control/ protection: None   Other Topics Concern  . None   Social History Narrative  . None   Additional History:   Assessment:   Musculoskeletal: Strength & Muscle Tone: within normal limits Gait & Station: normal Patient leans: N/A  Psychiatric Specialty Exam: Medication Refill   Anxiety  Patient reports no insomnia, nervous/anxious behavior or suicidal ideas.    Depression         Associated symptoms include does not have insomnia and no suicidal ideas.  Past medical history includes anxiety.     Review of Systems  Psychiatric/Behavioral: Negative for depression, hallucinations, memory loss, substance abuse and suicidal ideas. The patient is not nervous/anxious and does not have insomnia.   All other systems reviewed and are negative.   Blood pressure 99/64, pulse 96, temperature 98.6 F (37 C), temperature source Oral, weight 134 lb (60.8 kg).Body mass index is 20.99 kg/m.  General Appearance: Neat  and Well Groomed  Eye Contact:  Good  Speech:  Normal Rate  Volume:  Normal  Mood:  good  Affect:  congruent  Thought Process:  Linear and Logical  Orientation:  Full (Time, Place, and Person)  Thought Content:  Negative  Suicidal Thoughts:  No  Homicidal Thoughts:  No  Memory:  Immediate;   Good Recent;   Good Remote;   Good  Judgement:  Good  Insight:  Good  Psychomotor Activity:  Negative  Concentration:  Good  Recall:  Good  Fund of Knowledge: Good  Language: Good  Akathisia:  Negative  Handed:  Right unknown  AIMS (if indicated):  Not done  Assets:  Communication Skills Desire for  Improvement Social Support Vocational/Educational  ADL's:  Intact  Cognition: WNL  Sleep:  ok   Is the patient at risk to self?  No. Has the patient been a risk to self in the past 6 months?  No. Has the patient been a risk to self within the distant past?  No. Is the patient a risk to others?  No. Has the patient been a risk to others in the past 6 months?  No. Has the patient been a risk to others within the distant past?  No.  Current Medications: Current Outpatient Prescriptions  Medication Sig Dispense Refill  . ALPRAZolam (XANAX) 0.5 MG tablet Take 1 tablet (0.5 mg total) by mouth 2 (two) times daily as needed for anxiety. 180 tablet 0  . BuPROPion HCl ER, XL, 450 MG TB24 Take 450 mg by mouth every morning. 30 tablet 2  . FLUoxetine (PROZAC) 20 MG capsule Take 3 capsules (60 mg total) by mouth daily. 90 capsule 1  . fluticasone (FLONASE) 50 MCG/ACT nasal spray Place 2 sprays into both nostrils daily.    Marland Kitchen ibuprofen (ADVIL,MOTRIN) 600 MG tablet Take 1 tablet (600 mg total) by mouth every 8 (eight) hours as needed. 15 tablet 0  . medroxyPROGESTERone (DEPO-PROVERA) 150 MG/ML injection Inject into the muscle.    . Multiple Vitamins-Minerals (MULTIVITAMIN GUMMIES ADULT) CHEW Chew by mouth.     No current facility-administered medications for this visit.     Medical Decision Making:  Established Problem, Stable/Improving (1)  Treatment Plan Summary:Medication management and Plan   Major depressive disorder, recurrent, moderate  Continue wellbutrin at 300mg  po qd. Continue  Prozac at 60mg  po qd.  Social Anxiety Disorder-alprazolam .5  as needed for anxiety. Patient states she is using this about once per month.   The patient will follow up in 3 month. She's been encouraged call with any questions or concerns prior to her next appointment.    Caia Lofaro 02/20/2017, 2:13 PM

## 2017-06-19 ENCOUNTER — Ambulatory Visit: Payer: BLUE CROSS/BLUE SHIELD | Admitting: Psychiatry

## 2017-08-03 IMAGING — CT CT RENAL STONE PROTOCOL
1 of 2 series · 3 of 32 positions shown, 7 images · non-contrast
Comparison: 06/16/2013

CLINICAL DATA: Right flank pain starting [REDACTED] night. Recent
diagnosis kidney stones. Gross hematuria.

EXAM:
CT ABDOMEN AND PELVIS WITHOUT CONTRAST
TECHNIQUE: Multidetector CT imaging of the abdomen and pelvis was performed
following the standard protocol without IV contrast.

[Series 4: lung windows · axial · 0.62mm/px · z∈[-1174,-1134]mm · 3 of 18 slices shown, 7 images]
[im 5/18  soft-tissue]
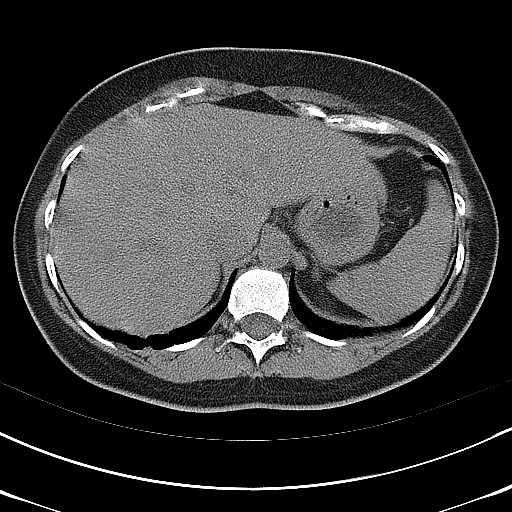
[im 5/18  lung]
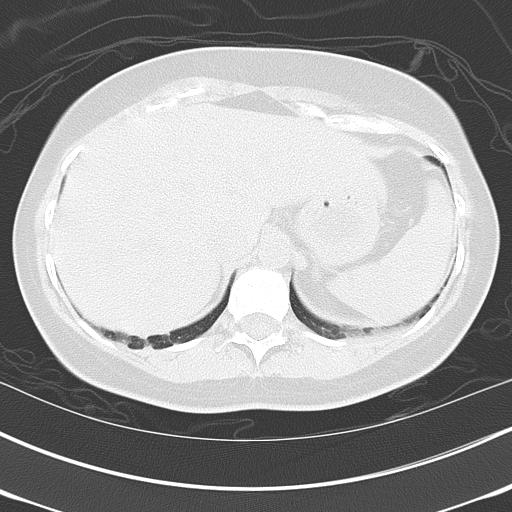
[im 5/18  bone]
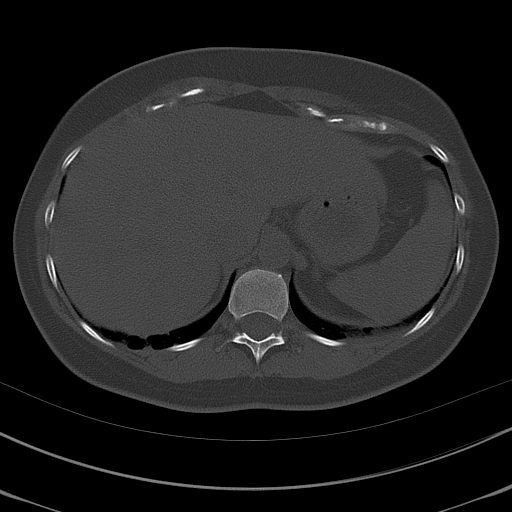
[im 9/18  soft-tissue]
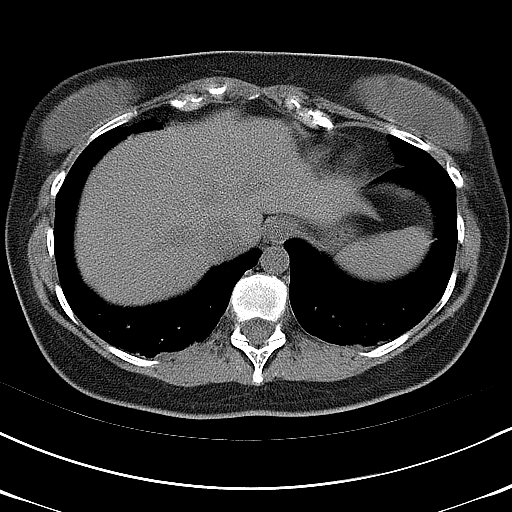
[im 9/18  lung]
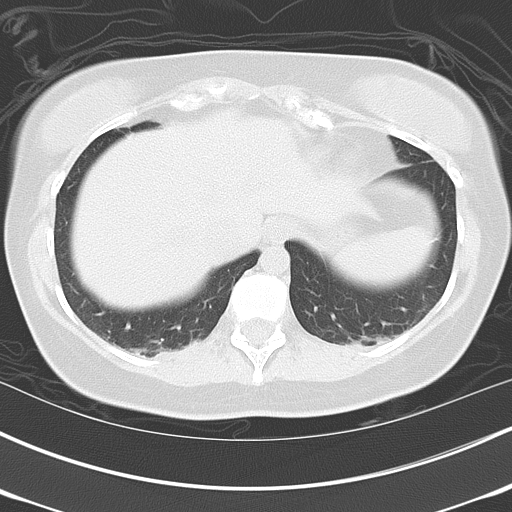
[im 13/18  soft-tissue]
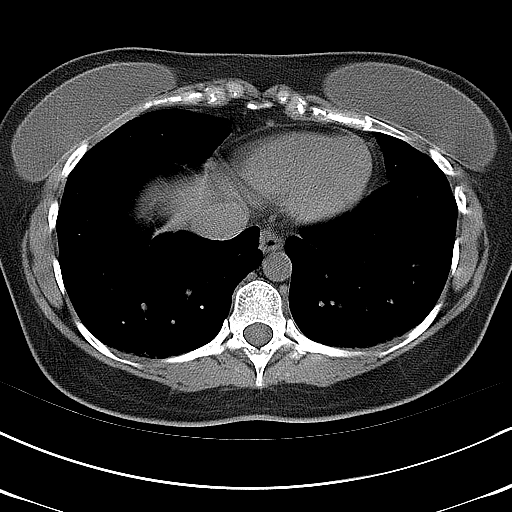
[im 13/18  lung]
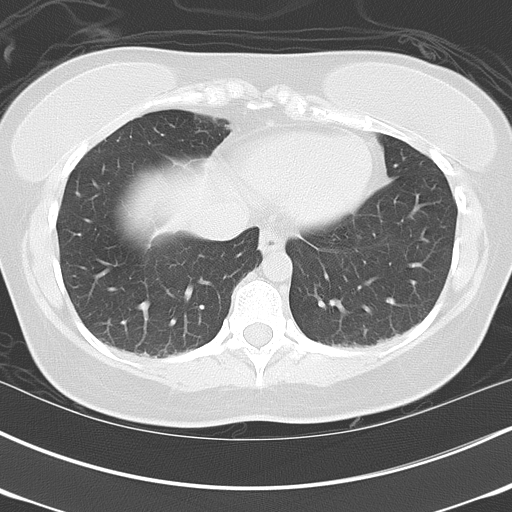

[3 of 32 positions shown; findings below may reference images not displayed]

FINDINGS: Lower chest: Dependent subsegmental atelectasis in both lower lobes.
Bilateral breast implants.

Hepatobiliary: Unremarkable

Pancreas: Unremarkable

Spleen: Unremarkable

Adrenals/Urinary Tract: Moderate right hydronephrosis and
considerable right hydroureter extending down to a 2 mm right UVJ
stone. Asymmetric right perirenal and periureteral stranding. No
other stones observed.

Stomach/Bowel: Prominent stool throughout the colon favors
constipation. Appendix normal.

Vascular/Lymphatic: Unremarkable

Reproductive: Unremarkable

Other: No supplemental non-categorized findings.

Musculoskeletal: Unremarkable
IMPRESSION: 1. Moderate right hydronephrosis and considerable right hydroureter
with perirenal and periureteral stranding associated with a 2 mm
right UVJ stone. No other stones seen. Appendix normal.
2.  Prominent stool throughout the colon favors constipation.

## 2017-11-02 ENCOUNTER — Ambulatory Visit: Payer: BLUE CROSS/BLUE SHIELD | Admitting: Psychiatry

## 2017-11-04 ENCOUNTER — Other Ambulatory Visit: Payer: Self-pay | Admitting: Psychiatry

## 2018-02-05 ENCOUNTER — Other Ambulatory Visit: Payer: Self-pay | Admitting: Psychiatry

## 2018-03-19 ENCOUNTER — Other Ambulatory Visit: Payer: Self-pay

## 2018-03-19 ENCOUNTER — Ambulatory Visit (INDEPENDENT_AMBULATORY_CARE_PROVIDER_SITE_OTHER): Payer: BLUE CROSS/BLUE SHIELD | Admitting: Psychiatry

## 2018-03-19 ENCOUNTER — Encounter: Payer: Self-pay | Admitting: Psychiatry

## 2018-03-19 VITALS — BP 103/70 | HR 106 | Temp 98.2°F | Wt 146.2 lb

## 2018-03-19 DIAGNOSIS — F411 Generalized anxiety disorder: Secondary | ICD-10-CM | POA: Diagnosis not present

## 2018-03-19 DIAGNOSIS — F4010 Social phobia, unspecified: Secondary | ICD-10-CM

## 2018-03-19 DIAGNOSIS — F331 Major depressive disorder, recurrent, moderate: Secondary | ICD-10-CM | POA: Diagnosis not present

## 2018-03-19 MED ORDER — LAMOTRIGINE 25 MG PO TABS
25.0000 mg | ORAL_TABLET | Freq: Every day | ORAL | 1 refills | Status: DC
Start: 2018-03-19 — End: 2018-05-21

## 2018-03-19 MED ORDER — HYDROXYZINE HCL 10 MG PO TABS: 10.0000 mg | ORAL_TABLET | Freq: Two times a day (BID) | ORAL | 1 refills | Status: DC | PRN

## 2018-03-19 MED ORDER — FLUOXETINE HCL 20 MG PO CAPS: 60.0000 mg | ORAL_CAPSULE | Freq: Every day | ORAL | 0 refills | Status: DC

## 2018-03-19 NOTE — Progress Notes (Signed)
Psychiatric Initial Adult Assessment   Patient Identification: Janice Gray MRN:  [id number] Date of Evaluation:  03/19/2018 Referral Source: Self Chief Complaint:  ' I want my medications.' Chief Complaint    Establish Care     Visit Diagnosis:    ICD-10-CM   1. Major depressive disorder, recurrent episode, moderate (HCC) F33.1 FLUoxetine (PROZAC) 20 MG capsule    lamoTRIgine (LAMICTAL) 25 MG tablet  2. Social anxiety disorder F40.10 FLUoxetine (PROZAC) 20 MG capsule  3. GAD (generalized anxiety disorder) F41.1 FLUoxetine (PROZAC) 20 MG capsule    hydrOXYzine (ATARAX/VISTARIL) 10 MG tablet    History of Present Illness:  Janice Gray is a 37 y old CF, married, employed, lives in [place], has a history of depression and anxiety, presented to the clinic today to reestablish care.  Patient used to follow up with Dr. Daleen Bo and Dr. Chrissie Noa in the past.  Patient stopped coming to the clinic for almost a year and decided to reestablish care.  Janice Gray today reports that she was getting her medications prescribed by her provider and hence kept procrastinating her appointments.  Patient reports that she has been struggling with depression since a child.  She reports there were times when she felt depressed to the point that she could not function in life.  She had trouble feeling sad all day and getting out of bed and so on.  She reports she continues to struggle with depressive symptoms like low energy, feeling sad, lack of motivation as well as sleep issues.  She reports her depressive symptoms have improved compared to how they where previously.  She however continues to feel very tired during the day and that is a big concern for her.  She reports she is on Prozac 60 mg now.  She reports it may be helping to some extent.  She was on Wellbutrin previously prescribed by her previous psychiatrist.  However she had to wean herself off of it because it made her anxiety worse.  She reports that Wellbutrin was  prescribed for her energy issues.  Patient also reports anxiety symptoms.  She calls herself a Product/process development scientist.  She reports she has a previous diagnosis of social anxiety disorder.  She reports she hates being in social situations and hates crowds.  She however reports she may also have other anxiety symptoms like feeling nervous or anxious or on edge all the time.  She also cannot stop worrying about everything in general.  She also feels restless and easily annoyed or irritable at times.  She wonders whether she has generalized anxiety disorder.  She reports she used to take Xanax in the past as needed which helped to some extent.  Patient does report some hypomanic symptoms on and off.  She reports there are times when she feels she has extreme energy and talks a lot and these periods last only for a few hours and does not happen too frequently.  Denies any perceptual disturbances.  She denies any history of trauma.  She denies any alcohol or other drug abuse.  She does report several psychosocial stressors.  She reports she was raised by her grandmother as a child.  She reports her grandmother currently is struggling with cancer and that is a big stressor for her.  She also reports her daughter was recently diagnosed with depression and she is worried about her daughter.  Patient also reports her stepson who is 23 years old has several mental health issues and currently undergoing treatment.  She reports  her husband is supportive.  Associated Signs/Symptoms: Depression Symptoms:  depressed mood, psychomotor retardation, fatigue, difficulty concentrating, anxiety, loss of energy/fatigue, (Hypo) Manic Symptoms:  mood lability Anxiety Symptoms:  Excessive Worry, Panic Symptoms, Social Anxiety, Psychotic Symptoms:  denies PTSD Symptoms: Negative  Past Psychiatric History: Pt used to follow up with Dr.William as well as Dr. Daleen Bo in the past.  Patient last saw Dr. Daleen Bo in clinic in March 2018.   Patient denies any history of suicide attempts.  Patient denies any inpatient mental health admissions.  Previous Psychotropic Medications: Yes , past trials of Abilify- light sensitivity, Seroquel-groggy, Wellbutrin-increased anxiety, Xanax.  Substance Abuse History in the last 12 months:  No.  Consequences of Substance Abuse: Negative  Past Medical History:  Past Medical History:  Diagnosis Date  . Anxiety   . Depression   . Headache    sinus  . History of kidney stones   . Microscopic hematuria   . Motion sickness    cars  . Wears contact lenses     Past Surgical History:  Procedure Laterality Date  . BREAST SURGERY     Implants  . ENDOSCOPIC CONCHA BULLOSA RESECTION Right 11/17/2016   Procedure: ENDOSCOPIC CONCHA BULLOSA RESECTION;  Surgeon: Vernie Murders, MD;  Location: [facility];  Service: ENT;  Laterality: Right;  . MANDIBLE FRACTURE SURGERY    . SEPTOPLASTY N/A 11/17/2016   Procedure: SEPTOPLASTY;  Surgeon: Vernie Murders, MD;  Location: [facility];  Service: ENT;  Laterality: N/A;  . TURBINATE REDUCTION Bilateral 11/17/2016   Procedure: PARTIAL INFERIOR TURBINATE REDUCTION;  Surgeon: Vernie Murders, MD;  Location: [facility];  Service: ENT;  Laterality: Bilateral;  . Wisdom Teeth Surgery      Family Psychiatric History: Daughter-depression recently diagnosed, currently on Prozac. Step son-Mental health issues.  Family History:  Family History  Problem Relation Age of Onset  . Kidney disease Neg Hx   . Bladder Cancer Neg Hx     Social History:   Social History   Socioeconomic History  . Marital status: Single    Spouse name: Not on file  . Number of children: Not on file  . Years of education: Not on file  . Highest education level: Not on file  Occupational History  . Not on file  Social Needs  . Financial resource strain: Not on file  . Food insecurity:    Worry: Not on file    Inability: Not on file  . Transportation  needs:    Medical: Not on file    Non-medical: Not on file  Tobacco Use  . Smoking status: Current Every Day Smoker    Packs/day: 0.50    Years: 15.00    Pack years: 7.50    Types: E-cigarettes, Cigarettes    Start date: 06/28/2000  . Smokeless tobacco: Never Used  Substance and Sexual Activity  . Alcohol use: Yes    Alcohol/week: 0.0 oz    Comment: special occ./ social  . Drug use: No  . Sexual activity: Yes    Birth control/protection: None  Lifestyle  . Physical activity:    Days per week: Not on file    Minutes per session: Not on file  . Stress: Not on file  Relationships  . Social connections:    Talks on phone: Not on file    Gets together: Not on file    Attends religious service: Not on file    Active member of club or organization: Not on file  Attends meetings of clubs or organizations: Not on file    Relationship status: Not on file  Other Topics Concern  . Not on file  Social History Narrative  . Not on file    Additional Social History: Janice Gray is married.  She works at [employer] store.  She has 2 biological children of her own 909 and 35 years old.  She has a stepson who is 35 years old who lives with her.  Her 867-year-old son lives with his dad and she has shared custody.  She also has shared custody of the 35 year old with her father.  Patient was raised as a child by her grandmother.  She reports her mother had her very young and she was also around when she grew up.  She does not know her father.  Allergies:   Allergies  Allergen Reactions  . Abilify [Aripiprazole]     LIGHT SENSITIVITY    Metabolic Disorder Labs: No results found for: HGBA1C, MPG No results found for: PROLACTIN Lab Results  Component Value Date   CHOL 150 05/25/2015   TRIG 75 05/25/2015   HDL 48 05/25/2015   LDLCALC 87 05/25/2015     Current Medications: Current Outpatient Medications  Medication Sig Dispense Refill  . FLUoxetine (PROZAC) 20 MG capsule Take 3 capsules (60  mg total) by mouth daily. 270 capsule 0  . fluticasone (FLONASE) 50 MCG/ACT nasal spray Place 2 sprays into both nostrils daily.    Marland Kitchen. ibuprofen (ADVIL,MOTRIN) 600 MG tablet Take 1 tablet (600 mg total) by mouth every 8 (eight) hours as needed. 15 tablet 0  . medroxyPROGESTERone (DEPO-PROVERA) 150 MG/ML injection Inject into the muscle.    . Multiple Vitamins-Minerals (MULTIVITAMIN GUMMIES ADULT) CHEW Chew by mouth.    . hydrOXYzine (ATARAX/VISTARIL) 10 MG tablet Take 1-2 tablets (10-20 mg total) by mouth 2 (two) times daily as needed for anxiety (SLEEP). 120 tablet 1  . lamoTRIgine (LAMICTAL) 25 MG tablet Take 1 tablet (25 mg total) by mouth daily with supper. 30 tablet 1   No current facility-administered medications for this visit.     Neurologic: Headache: No Seizure: No Paresthesias:No  Musculoskeletal: Strength & Muscle Tone: within normal limits Gait & Station: normal Patient leans: N/A  Psychiatric Specialty Exam: Review of Systems  Psychiatric/Behavioral: Positive for depression. The patient is nervous/anxious.   All other systems reviewed and are negative.   Blood pressure 103/70, pulse (!) 106, temperature 98.2 F (36.8 C), temperature source Oral, weight 146 lb 3.2 oz (66.3 kg).Body mass index is 22.9 kg/m.  General Appearance: Casual  Eye Contact:  Fair  Speech:  Clear and Coherent  Volume:  Normal  Mood:  Anxious, Depressed and Dysphoric  Affect:  Congruent  Thought Process:  Goal Directed and Descriptions of Associations: Intact  Orientation:  Full (Time, Place, and Person)  Thought Content:  Logical  Suicidal Thoughts:  No  Homicidal Thoughts:  No  Memory:  Immediate;   Fair Recent;   Fair Remote;   Fair  Judgement:  Fair  Insight:  Fair  Psychomotor Activity:  Normal  Concentration:  Concentration: Fair and Attention Span: Fair  Recall:  FiservFair  Fund of Knowledge:Fair  Language: Fair  Akathisia:  No  Handed:  Right  AIMS (if indicated):  na  Assets:   Communication Skills Desire for Improvement Housing Social Support  ADL's:  Intact  Cognition: WNL  Sleep:  Restless    Treatment Plan Summary:Janice Gray is a 37 year old Caucasian female who is married,  employed, lives in [place], has a history of depression, anxiety, presented to the clinic today to reestablish care.  Patient was last seen in clinic by Dr. Daleen Bo in March 2018.  Patient continues to struggle with depression and anxiety symptoms.  Patient also has several psychosocial stressors.  Patient reports she also has a family history of mental health issues.  She denies any past history of suicide attempts and currently denies it.  She also denies any current substance abuse problems.  Patient was offered psychotherapy but declined it.  Patient reports she has financial issues and will not be able to afford it at this time.  Discussed medication changes with patient.  Plan as noted below. Medication management and Plan as noted below  Plan For MDD Continue Prozac 60 mg p.o. daily Start Lamictal 25 mg p.o. every afternoon Start Vistaril 10-20 mg p.o. twice daily as needed for severe anxiety symptoms PHQ 9 equals 12  Patient offered CBT referral but she declined it.   For GAD GAD 7 equals 17 Continue Prozac as prescribed Add Vistaril as prescribed as needed. Patient offered CBT referral but she declined it.  For social anxiety disorder Continue medications as prescribed.  I have reviewed medical records in EHR Per Dr. Daleen Bo and Dr. Mayford Knife.  I did medication education, provided handouts.  Follow-up in clinic in 4 weeks or sooner if needed.  This note was generated in part or whole with voice recognition software. Voice recognition is usually quite accurate but there are transcription errors that can and very often do occur. I apologize for any typographical errors that were not detected and corrected.      [name], MD 4/1/201912:11 PM

## 2018-03-19 NOTE — Patient Instructions (Signed)
Hydroxyzine capsules or tablets What is this medicine? HYDROXYZINE (hye [place] i zeen) is an antihistamine. This medicine is used to treat allergy symptoms. It is also used to treat anxiety and tension. This medicine can be used with other medicines to induce sleep before surgery. This medicine may be used for other purposes; ask your health care provider or pharmacist if you have questions. COMMON BRAND NAME(S): ANX, Atarax, Rezine, Vistaril What should I tell my health care provider before I take this medicine? They need to know if you have any of these conditions: -any chronic illness -difficulty passing urine -glaucoma -heart disease -kidney disease -liver disease -lung disease -an unusual or allergic reaction to hydroxyzine, cetirizine, other medicines, foods, dyes, or preservatives -pregnant or trying to get pregnant -breast-feeding How should I use this medicine? Take this medicine by mouth with a full glass of water. Follow the directions on the prescription label. You may take this medicine with food or on an empty stomach. Take your medicine at regular intervals. Do not take your medicine more often than directed. Talk to your pediatrician regarding the use of this medicine in children. Special care may be needed. While this drug may be prescribed for children as young as 75 years of age for selected conditions, precautions do apply. Patients over 62 years old may have a stronger reaction and need a smaller dose. Overdosage: If you think you have taken too much of this medicine contact a poison control center or emergency room at once. NOTE: This medicine is only for you. Do not share this medicine with others. What if I miss a dose? If you miss a dose, take it as soon as you can. If it is almost time for your next dose, take only that dose. Do not take double or extra doses. What may interact with this medicine? -alcohol -barbiturate medicines for sleep or seizures -medicines for  colds, allergies -medicines for depression, anxiety, or emotional disturbances -medicines for pain -medicines for sleep -muscle relaxants This list may not describe all possible interactions. Give your health care provider a list of all the medicines, herbs, non-prescription drugs, or dietary supplements you use. Also tell them if you smoke, drink alcohol, or use illegal drugs. Some items may interact with your medicine. What should I watch for while using this medicine? Tell your doctor or health care professional if your symptoms do not improve. You may get drowsy or dizzy. Do not drive, use machinery, or do anything that needs mental alertness until you know how this medicine affects you. Do not stand or sit up quickly, especially if you are an older patient. This reduces the risk of dizzy or fainting spells. Alcohol may interfere with the effect of this medicine. Avoid alcoholic drinks. Your mouth may get dry. Chewing sugarless gum or sucking hard candy, and drinking plenty of water may help. Contact your doctor if the problem does not go away or is severe. This medicine may cause dry eyes and blurred vision. If you wear contact lenses you may feel some discomfort. Lubricating drops may help. See your eye doctor if the problem does not go away or is severe. If you are receiving skin tests for allergies, tell your doctor you are using this medicine. What side effects may I notice from receiving this medicine? Side effects that you should report to your doctor or health care professional as soon as possible: -fast or irregular heartbeat -difficulty passing urine -seizures -slurred speech or confusion -tremor Side effects that  usually do not require medical attention (report to your doctor or health care professional if they continue or are bothersome): -constipation -drowsiness -fatigue -headache -stomach upset This list may not describe all possible side effects. Call your doctor for  medical advice about side effects. You may report side effects to FDA at 1-800-FDA-1088. Where should I keep my medicine? Keep out of the reach of children. Store at room temperature between 15 and 30 degrees C (59 and 86 degrees F). Keep container tightly closed. Throw away any unused medicine after the expiration date. NOTE: This sheet is a summary. It may not cover all possible information. If you have questions about this medicine, talk to your doctor, pharmacist, or health care provider.  2018 Elsevier/Gold Standard (2008-04-18 14:50:59) Lamotrigine tablets What is this medicine? LAMOTRIGINE (la MOE Patrecia Pacetri jeen) is used to control seizures in adults and children with epilepsy and Lennox-Gastaut syndrome. It is also used in adults to treat bipolar disorder. This medicine may be used for other purposes; ask your health care provider or pharmacist if you have questions. COMMON BRAND NAME(S): Lamictal What should I tell my health care provider before I take this medicine? They need to know if you have any of these conditions: -a history of depression or bipolar disorder -aseptic meningitis during prior use of lamotrigine -folate deficiency -kidney disease -liver disease -suicidal thoughts, plans, or attempt; a previous suicide attempt by you or a family member -an unusual or allergic reaction to lamotrigine or other seizure medications, other medicines, foods, dyes, or preservatives -pregnant or trying to get pregnant -breast-feeding How should I use this medicine? Take this medicine by mouth with a glass of water. Follow the directions on the prescription label. Do not chew these tablets. If this medicine upsets your stomach, take it with food or milk. Take your doses at regular intervals. Do not take your medicine more often than directed. A special MedGuide will be given to you by the pharmacist with each new prescription and refill. Be sure to read this information carefully each  time. Talk to your pediatrician regarding the use of this medicine in children. While this drug may be prescribed for children as young as 2 years for selected conditions, precautions do apply. Overdosage: If you think you have taken too much of this medicine contact a poison control center or emergency room at once. NOTE: This medicine is only for you. Do not share this medicine with others. What if I miss a dose? If you miss a dose, take it as soon as you can. If it is almost time for your next dose, take only that dose. Do not take double or extra doses. What may interact with this medicine? -carbamazepine -female hormones, including contraceptive or birth control pills -methotrexate -phenobarbital -phenytoin -primidone -pyrimethamine -rifampin -trimethoprim -valproic acid This list may not describe all possible interactions. Give your health care provider a list of all the medicines, herbs, non-prescription drugs, or dietary supplements you use. Also tell them if you smoke, drink alcohol, or use illegal drugs. Some items may interact with your medicine. What should I watch for while using this medicine? Visit your doctor or health care professional for regular checks on your progress. If you take this medicine for seizures, wear a Medic Alert bracelet or necklace. Carry an identification card with information about your condition, medicines, and doctor or health care professional. It is important to take this medicine exactly as directed. When first starting treatment, your dose will need to be  adjusted slowly. It may take weeks or months before your dose is stable. You should contact your doctor or health care professional if your seizures get worse or if you have any new types of seizures. Do not stop taking this medicine unless instructed by your doctor or health care professional. Stopping your medicine suddenly can increase your seizures or their severity. Contact your doctor or health  care professional right away if you develop a rash while taking this medicine. Rashes may be very severe and sometimes require treatment in the hospital. Deaths from rashes have occurred. Serious rashes occur more often in children than adults taking this medicine. It is more common for these serious rashes to occur during the first 2 months of treatment, but a rash can occur at any time. You may get drowsy, dizzy, or have blurred vision. Do not drive, use machinery, or do anything that needs mental alertness until you know how this medicine affects you. To reduce dizzy or fainting spells, do not sit or stand up quickly, especially if you are an older patient. Alcohol can increase drowsiness and dizziness. Avoid alcoholic drinks. If you are taking this medicine for bipolar disorder, it is important to report any changes in your mood to your doctor or health care professional. If your condition gets worse, you get mentally depressed, feel very hyperactive or manic, have difficulty sleeping, or have thoughts of hurting yourself or committing suicide, you need to get help from your health care professional right away. If you are a caregiver for someone taking this medicine for bipolar disorder, you should also report these behavioral changes right away. The use of this medicine may increase the chance of suicidal thoughts or actions. Pay special attention to how you are responding while on this medicine. Your mouth may get dry. Chewing sugarless gum or sucking hard candy, and drinking plenty of water may help. Contact your doctor if the problem does not go away or is severe. Women who become pregnant while using this medicine may enroll in the [place] Antiepileptic Drug Pregnancy Registry by calling 1-888-233-2334. This registry collects information about the safety of antiepileptic drug use during pregnancy. What side effects may I notice from receiving this medicine? Side effects that you should report  to your doctor or health care professional as soon as possible: -allergic reactions like skin rash, itching or hives, swelling of the face, lips, or tongue -blurred or double vision -difficulty walking or controlling muscle movements -fever -headache, stiff neck, and sensitivity to light -painful sores in the mouth, eyes, or nose -redness, blistering, peeling or loosening of the skin, including inside the mouth -severe muscle pain -swollen lymph glands -uncontrollable eye movements -unusual bruising or bleeding -unusually weak or tired -vomiting -worsening of mood, thoughts or actions of suicide or dying -yellowing of the eyes or skin Side effects that usually do not require medical attention (report to your doctor or health care professional if they continue or are bothersome): -diarrhea or constipation -difficulty sleeping -nausea -tremors This list may not describe all possible side effects. Call your doctor for medical advice about side effects. You may report side effects to FDA at 1-800-FDA-1088. Where should I keep my medicine? Keep out of reach of children. Store at room temperature between 15 and 30 degrees C (59 and 86 degrees F). Throw away any unused medicine after the expiration date. NOTE: This sheet is a summary. It may not cover all possible information. If you have questions about this medicine, talk   to your doctor, pharmacist, or health care provider.  2018 Elsevier/Gold Standard (2016-01-07 09:29:40)

## 2018-04-17 ENCOUNTER — Ambulatory Visit (INDEPENDENT_AMBULATORY_CARE_PROVIDER_SITE_OTHER): Payer: BLUE CROSS/BLUE SHIELD | Admitting: Psychiatry

## 2018-04-17 ENCOUNTER — Other Ambulatory Visit: Payer: Self-pay

## 2018-04-17 ENCOUNTER — Ambulatory Visit: Payer: BLUE CROSS/BLUE SHIELD | Admitting: Psychiatry

## 2018-04-17 ENCOUNTER — Encounter: Payer: Self-pay | Admitting: Psychiatry

## 2018-04-17 VITALS — BP 104/71 | HR 84 | Temp 98.3°F | Wt 148.8 lb

## 2018-04-17 DIAGNOSIS — F4010 Social phobia, unspecified: Secondary | ICD-10-CM | POA: Diagnosis not present

## 2018-04-17 DIAGNOSIS — F5105 Insomnia due to other mental disorder: Secondary | ICD-10-CM

## 2018-04-17 DIAGNOSIS — F331 Major depressive disorder, recurrent, moderate: Secondary | ICD-10-CM | POA: Diagnosis not present

## 2018-04-17 DIAGNOSIS — F411 Generalized anxiety disorder: Secondary | ICD-10-CM

## 2018-04-17 MED ORDER — HYDROXYZINE PAMOATE 25 MG PO CAPS: 25.0000 mg | ORAL_CAPSULE | Freq: Two times a day (BID) | ORAL | 1 refills | Status: DC | PRN

## 2018-04-17 MED ORDER — DOXEPIN HCL 10 MG PO CAPS: 10.0000 mg | ORAL_CAPSULE | Freq: Every evening | ORAL | 1 refills | Status: DC | PRN

## 2018-04-17 NOTE — Progress Notes (Signed)
BH MD  OP Progress Note  04/17/2018 5:18 PM Janice Gray  MRN:  [id number]  Chief Complaint: ' I am here for follow up.' Chief Complaint    Follow-up; Medication Refill     HPI: Janice Gray is a 37 year old Caucasian female, married, employed, lives in [place], has a history of depression, anxiety, presented to the clinic today for a follow-up visit.  Pt today reports she is having psychosocial stressors of her grandmother's illness.  Her grandmother was recently diagnosed with ovarian cancer and is currently going through chemo therapy.  Patient reports her grandmother is unable to eat anything and gets tube feeding.  Patient reports she is very close to her grandmother since she raised her.  Patient reports she is struggling to cope with her depression as she watches her grandmother struggle.  Patient reports the doctors have possibly given a poor prognosis for her grandmother and that makes her sad.  Patient became very tearful during the evaluation.  Provided supportive psychotherapy.  Reassured patient.  Discussed referral for therapy.  Patient agreed with plan.  She reports she started taking the Lamictal however she is unable to say if it is really helping.  Patient also has current  psychosocial stressors and hence it is difficult to really know whether the medication is effective or not since her situation could also be contributing to her mood sx.  Patient continues to struggle with sleep.  She reports she tried the hydroxyzine 10 mg but that does not help.    Discussed adding medications like Seroquel, risperidone and so on.  Patient reports she had a bad previous reaction to Seroquel and does not want to be on it.  Patient also reports she does not want to try risperidone since her son took it and he had to be taken off of it due to severe weight gain.  She denies any suicidality or homicidality or perceptual disturbances.  Patient reports her husband is supportive.  Visit Diagnosis:     ICD-10-CM   1. Major depressive disorder, recurrent episode, moderate (HCC) F33.1   2. Social anxiety disorder F40.10   3. GAD (generalized anxiety disorder) F41.1   4. Insomnia due to mental condition F51.05     Past Psychiatric History: Past trials of medications like Abilify-light sensitivity, Seroquel- groggy, Wellbutrin-increased anxiety, Xanax.  Reviewed past psychiatric history from my progress note on 03/19/2018.  Past Medical History:  Past Medical History:  Diagnosis Date  . Anxiety   . Depression   . Headache    sinus  . History of kidney stones   . Microscopic hematuria   . Motion sickness    cars  . Wears contact lenses     Past Surgical History:  Procedure Laterality Date  . BREAST SURGERY     Implants  . ENDOSCOPIC CONCHA B[place]OSA RESECTION Right 11/17/2016   Procedure: ENDOSCOPIC CONCHA [place] RESECTION;  Surgeon: Vernie Murders, MD;  Location: [facility];  Service: ENT;  Laterality: Right;  . MANDIBLE FRACTURE SURGERY    . SEPTOPLASTY N/A 11/17/2016   Procedure: SEPTOPLASTY;  Surgeon: Vernie Murders, MD;  Location: [facility];  Service: ENT;  Laterality: N/A;  . TURBINATE REDUCTION Bilateral 11/17/2016   Procedure: PARTIAL INFERIOR TURBINATE REDUCTION;  Surgeon: Vernie Murders, MD;  Location: [facility];  Service: ENT;  Laterality: Bilateral;  . Wisdom Teeth Surgery      Family Psychiatric History: Reviewed family psychiatric history from my progress note on 03/19/2018  Family History:  Family  History  Problem Relation Age of Onset  . Kidney disease Neg Hx   . Bladder Cancer Neg Hx     Social History: Patient is married.  She works at [employer].  She has 2 biological children, 33 and 44 years old.  She has a stepson who is 37 years old.  I have reviewed social history from my progress note on 03/19/2018. Social History   Socioeconomic History  . Marital status: Married    Spouse name: Janice Gray  . Number of children: 2  .  Years of education: Not on file  . Highest education level: Some college, no degree  Occupational History    Comment: fulltime  Social Needs  . Financial resource strain: Somewhat hard  . Food insecurity:    Worry: Never true    Inability: Never true  . Transportation needs:    Medical: No    Non-medical: No  Tobacco Use  . Smoking status: Current Every Day Smoker    Packs/day: 0.50    Years: 15.00    Pack years: 7.50    Types: E-cigarettes, Cigarettes    Start date: 06/28/2000  . Smokeless tobacco: Never Used  Substance and Sexual Activity  . Alcohol use: Yes    Alcohol/week: 0.0 oz    Comment: special occ./ social  . Drug use: No  . Sexual activity: Yes    Birth control/protection: None  Lifestyle  . Physical activity:    Days per week: 0 days    Minutes per session: 0 min  . Stress: Very much  Relationships  . Social connections:    Talks on phone: Three times a week    Gets together: Twice a week    Attends religious service: Never    Active member of club or organization: No    Attends meetings of clubs or organizations: Never    Relationship status: Married  Other Topics Concern  . Not on file  Social History Narrative  . Not on file    Allergies:  Allergies  Allergen Reactions  . Abilify [Aripiprazole]     LIGHT SENSITIVITY    Metabolic Disorder Labs: No results found for: HGBA1C, MPG No results found for: PROLACTIN Lab Results  Component Value Date   CHOL 150 05/25/2015   TRIG 75 05/25/2015   HDL 48 05/25/2015   LDLCALC 87 05/25/2015   Lab Results  Component Value Date   TSH 1.866 03/30/2015    Therapeutic Level Labs: No results found for: LITHIUM No results found for: VALPROATE No components found for:  CBMZ  Current Medications: Current Outpatient Medications  Medication Sig Dispense Refill  . FLUoxetine (PROZAC) 20 MG capsule Take 3 capsules (60 mg total) by mouth daily. 270 capsule 0  . fluticasone (FLONASE) 50 MCG/ACT nasal  spray Place 2 sprays into both nostrils daily.    Marland Kitchen ibuprofen (ADVIL,MOTRIN) 600 MG tablet Take 1 tablet (600 mg total) by mouth every 8 (eight) hours as needed. 15 tablet 0  . lamoTRIgine (LAMICTAL) 25 MG tablet Take 1 tablet (25 mg total) by mouth daily with supper. 30 tablet 1  . medroxyPROGESTERone (DEPO-PROVERA) 150 MG/ML injection Inject into the muscle.    . Multiple Vitamins-Minerals (MULTIVITAMIN GUMMIES ADULT) CHEW Chew by mouth.    . doxepin (SINEQUAN) 10 MG capsule Take 1-2 capsules (10-20 mg total) by mouth at bedtime as needed. For sleep 60 capsule 1  . hydrOXYzine (VISTARIL) 25 MG capsule Take 1-2 capsules (25-50 mg total) by mouth 3  times/day as needed-between meals & bedtime for anxiety (sleep). 180 capsule 1   No current facility-administered medications for this visit.      Musculoskeletal: Strength & Muscle Tone: within normal limits Gait & Station: normal Patient leans: N/A  Psychiatric Specialty Exam: Review of Systems  Psychiatric/Behavioral: Positive for depression. The patient is nervous/anxious and has insomnia.   All other systems reviewed and are negative.   Blood pressure 104/71, pulse 84, temperature 98.3 F (36.8 C), temperature source Oral, weight 148 lb 12.8 oz (67.5 kg).Body mass index is 23.31 kg/m.  General Appearance: Casual  Eye Contact:  Fair  Speech:  Normal Rate  Volume:  Normal  Mood:  Anxious, Depressed and Dysphoric  Affect:  Tearful  Thought Process:  Goal Directed and Descriptions of Associations: Intact  Orientation:  Full (Time, Place, and Person)  Thought Content: Logical   Suicidal Thoughts:  No  Homicidal Thoughts:  No  Memory:  Immediate;   Fair Recent;   Fair Remote;   Fair  Judgement:  Fair  Insight:  Fair  Psychomotor Activity:  Normal  Concentration:  Concentration: Fair and Attention Span: Fair  Recall:  [employer] of Knowledge: Fair  Language: Fair  Akathisia:  No  Handed:  Right  AIMS (if indicated): na   Assets:  Communication Skills Desire for Improvement Social Support  ADL's:  Intact  Cognition: WNL  Sleep:  Poor   Screenings:   Assessment and Plan: Holy is a 37 year old Caucasian female who is married, employed, lives in [place], has a history of depression, anxiety, presented to the clinic today for a follow-up visit.  Patient is currently struggling with psychosocial stressors of her grandmother having ovarian cancer and going through treatment.  Her grandmother currently has a poor prognosis and that what he said patient.  Patient also struggles with mood symptoms and sleep issues.  Patient continues to have good support system.  Discussed referral for psychotherapy, she agreed with plan.  Plan as noted below.  Plan MDD Continue Prozac 60 mg p.o. daily Continue Lamictal 25 mg p.o. every afternoon. Increase hydroxyzine to 25-50 mg p.o. twice daily as needed as well as at bedtime as needed.  For GAD Continue Prozac as prescribed Increase hydroxyzine 25-50 mg p.o. 3 times daily as needed Referral for CBT with Ms. Felecia Jan  For social anxiety disorder Refer for CBT  Insomnia Start doxepin 10-20 mg p.o. nightly as needed  Follow-up in clinic in 4 weeks or sooner if needed.  More than 50 % of the time was spent for psychoeducation and supportive psychotherapy and care coordination.  This note was generated in part or whole with voice recognition software. Voice recognition is usually quite accurate but there are transcription errors that can and very often do occur. I apologize for any typographical errors that were not detected and corrected.       [name], MD 04/17/2018, 5:18 PM

## 2018-04-17 NOTE — Patient Instructions (Signed)
Doxepin capsules What is this medicine? DOXEPIN (DOX e pin) is used to treat depression and anxiety. This medicine may be used for other purposes; ask your health care provider or pharmacist if you have questions. COMMON BRAND NAME(S): Sinequan What should I tell my health care provider before I take this medicine? They need to know if you have any of these conditions: -bipolar disorder -difficulty passing urine -glaucoma -heart disease -if you frequently drink alcohol containing drinks -liver disease -lung or breathing disease, like asthma or sleep apnea -prostate trouble -schizophrenia -seizures -suicidal thoughts, plans, or attempt; a previous suicide attempt by you or a family member -an unusual or allergic reaction to doxepin, other medicines, foods, dyes, or preservatives -pregnant or trying to get pregnant -breast-feeding How should I use this medicine? Take this medicine by mouth with a glass of water. Follow the directions on the prescription label. Take your doses at regular intervals. Do not take your medicine more often than directed. Do not stop taking this medicine suddenly except upon the advice of your doctor. Stopping this medicine too quickly may cause serious side effects or your condition may worsen. A special MedGuide will be given to you by the pharmacist with each prescription and refill. Be sure to read this information carefully each time. Talk to your pediatrician regarding the use of this medicine in children. While this drug may be prescribed for children as young as 12 years for selected conditions, precautions do apply. Overdosage: If you think you have taken too much of this medicine contact a poison control center or emergency room at once. NOTE: This medicine is only for you. Do not share this medicine with others. What if I miss a dose? If you miss a dose, take it as soon as you can. If it is almost time for your next dose, take only that dose. Do not  take double or extra doses. What may interact with this medicine? Do not take this medicine with any of the following medications: -arsenic trioxide -certain medicines used to regulate abnormal heartbeat or to treat other heart conditions -cisapride -halofantrine -levomethadyl -linezolid -MAOIs like Carbex, Eldepryl, Marplan, Nardil, and Parnate -methylene blue -other medicines for mental depression -phenothiazines like perphenazine, thioridazine and chlorpromazine -pimozide -procarbazine -sparfloxacin -St. John's Wort -ziprasidone This medicine may also interact with the following medications: -cimetidine -tolazamide This list may not describe all possible interactions. Give your health care provider a list of all the medicines, herbs, non-prescription drugs, or dietary supplements you use. Also tell them if you smoke, drink alcohol, or use illegal drugs. Some items may interact with your medicine. What should I watch for while using this medicine? Visit your doctor or health care professional for regular checks on your progress. It can take several days before you feel the full effect of this medicine. If you have been taking this medicine regularly for some time, do not suddenly stop taking it. You must gradually reduce the dose or you may get severe side effects. Ask your doctor or health care professional for advice. Even after you stop taking this medicine it can still affect your body for several days. Patients and their families should watch out for new or worsening thoughts of suicide or depression. Also watch out for sudden changes in feelings such as feeling anxious, agitated, panicky, irritable, hostile, aggressive, impulsive, severely restless, overly excited and hyperactive, or not being able to sleep. If this happens, especially at the beginning of treatment or after a change in dose,   call your health care professional. You may get drowsy or dizzy. Do not drive, use machinery,  or do anything that needs mental alertness until you know how this medicine affects you. Do not stand or sit up quickly, especially if you are an older patient. This reduces the risk of dizzy or fainting spells. Alcohol may increase dizziness and drowsiness. Avoid alcoholic drinks. Do not treat yourself for coughs, colds, or allergies without asking your doctor or health care professional for advice. Some ingredients can increase possible side effects. Your mouth may get dry. Chewing sugarless gum or sucking hard candy, and drinking plenty of water may help. Contact your doctor if the problem does not go away or is severe. This medicine may cause dry eyes and blurred vision. If you wear contact lenses you may feel some discomfort. Lubricating drops may help. See your eye doctor if the problem does not go away or is severe. This medicine can make you more sensitive to the sun. Keep out of the sun. If you cannot avoid being in the sun, wear protective clothing and use sunscreen. Do not use sun lamps or tanning beds/booths. What side effects may I notice from receiving this medicine? Side effects that you should report to your doctor or health care professional as soon as possible: -allergic reactions like skin rash, itching or hives, swelling of the face, lips, or tongue -anxious -breathing problems -changes in vision -confusion -elevated mood, decreased need for sleep, racing thoughts, impulsive behavior -eye pain -fast, irregular heartbeat -feeling faint or lightheaded, falls -feeling agitated, angry, or irritable -fever with increased sweating -hallucination, loss of contact with reality -seizures -stiff muscles -suicidal thoughts or other mood changes -tingling, pain, or numbness in the feet or hands -trouble passing urine or change in the amount of urine -trouble sleeping -unusually weak or tired -vomiting -yellowing of the eyes or skin Side effects that usually do not require medical  attention (report to your doctor or health care professional if they continue or are bothersome): -change in sex drive or performance -change in appetite or weight -constipation -dizziness -dry mouth -nausea -tired -tremors -upset stomach This list may not describe all possible side effects. Call your doctor for medical advice about side effects. You may report side effects to FDA at 1-800-FDA-1088. Where should I keep my medicine? Keep out of the reach of children. Store at room temperature between 15 and 30 degrees C (59 and 86 degrees F). Throw away any unused medicine after the expiration date. NOTE: This sheet is a summary. It may not cover all possible information. If you have questions about this medicine, talk to your doctor, pharmacist, or health care provider.  2018 Elsevier/Gold Standard (2016-05-06 12:35:05)  

## 2018-04-23 ENCOUNTER — Ambulatory Visit: Payer: BLUE CROSS/BLUE SHIELD | Admitting: Psychiatry

## 2018-05-07 ENCOUNTER — Other Ambulatory Visit: Payer: Self-pay | Admitting: Psychiatry

## 2018-05-07 DIAGNOSIS — F4010 Social phobia, unspecified: Secondary | ICD-10-CM

## 2018-05-07 DIAGNOSIS — F411 Generalized anxiety disorder: Secondary | ICD-10-CM

## 2018-05-07 DIAGNOSIS — F331 Major depressive disorder, recurrent, moderate: Secondary | ICD-10-CM

## 2018-05-17 ENCOUNTER — Ambulatory Visit: Payer: BLUE CROSS/BLUE SHIELD | Admitting: Psychiatry

## 2018-05-21 ENCOUNTER — Encounter: Payer: Self-pay | Admitting: Psychiatry

## 2018-05-21 ENCOUNTER — Other Ambulatory Visit: Payer: Self-pay

## 2018-05-21 ENCOUNTER — Ambulatory Visit (INDEPENDENT_AMBULATORY_CARE_PROVIDER_SITE_OTHER): Payer: BLUE CROSS/BLUE SHIELD | Admitting: Psychiatry

## 2018-05-21 VITALS — BP 105/69 | HR 83 | Temp 98.3°F | Wt 150.8 lb

## 2018-05-21 DIAGNOSIS — F5105 Insomnia due to other mental disorder: Secondary | ICD-10-CM

## 2018-05-21 DIAGNOSIS — F4010 Social phobia, unspecified: Secondary | ICD-10-CM

## 2018-05-21 DIAGNOSIS — F411 Generalized anxiety disorder: Secondary | ICD-10-CM

## 2018-05-21 DIAGNOSIS — F331 Major depressive disorder, recurrent, moderate: Secondary | ICD-10-CM

## 2018-05-21 MED ORDER — HYDROXYZINE PAMOATE 25 MG PO CAPS: 25.0000 mg | ORAL_CAPSULE | Freq: Two times a day (BID) | ORAL | 1 refills | Status: DC | PRN

## 2018-05-21 MED ORDER — DOXEPIN HCL 10 MG PO CAPS: 10.0000 mg | ORAL_CAPSULE | Freq: Every evening | ORAL | 1 refills | Status: DC | PRN

## 2018-05-21 MED ORDER — LAMOTRIGINE 25 MG PO TABS: 25.0000 mg | ORAL_TABLET | Freq: Every day | ORAL | 1 refills | Status: DC

## 2018-05-21 NOTE — Progress Notes (Signed)
BH MD OP Progress Note  05/21/2018 3:57 PM Janice Gray  MRN:  [id number]  Chief Complaint: ' I am here for follow up." Chief Complaint    Follow-up; Medication Refill     HPI: Janice Gray is a 37 year old Caucasian female, married, employed, lives in [place], has a history of depression, anxiety, presented to the clinic today for a follow-up visit.  Patient today reports she is currently coping with her anxiety and depression well.  She reports she has noticed some improvement in her mood on the current medication regimen.  She continues to have psychosocial stressors of her grandmother's illness.  Her grandmother was recently diagnosed with ovarian cancer is currently undergoing chemotherapy.  Patient however reports her grandmother is responding well to the therapy and that also has helped her to feel better.  Patient reports sleep was better on the doxepin.  She reports she takes it only during weekends.  She continues to take hydroxyzine as needed at bedtime during weekdays.  She reports she continues to also have psychosocial stressors of her stepson having behavioral problems.  She is trying to find out what is going on with him and trying to get him the right help.  She reports she was never able to return [name]'s call back for therapy.  Discussed with patient to [facility] to schedule appointment to start psychotherapy sessions.  Patient denies any suicidality.  Patient denies any perceptual disturbances.  Patient reports her work is going well Visit Diagnosis:    ICD-10-CM   1. Major depressive disorder, recurrent episode, moderate (HCC) F33.1 lamoTRIgine (LAMICTAL) 25 MG tablet  2. Social anxiety disorder F40.10   3. GAD (generalized anxiety disorder) F41.1   4. Insomnia due to mental condition F51.05     Past Psychiatric History: Reviewed past psychiatric history from my progress note on 03/19/2018.  Past trials of medications like Abilify-light sensitivity,  Seroquel-groggy, Wellbutrin-increased anxiety, Xanax.  Past Medical History:  Past Medical History:  Diagnosis Date  . Anxiety   . Depression   . Headache    sinus  . History of kidney stones   . Microscopic hematuria   . Motion sickness    cars  . Wears contact lenses     Past Surgical History:  Procedure Laterality Date  . BREAST SURGERY     Implants  . ENDOSCOPIC CONCHA BULLOSA RESECTION Right 11/17/2016   Procedure: ENDOSCOPIC CONCHA BULLOSA RESECTION;  Surgeon: Vernie MurdersPaul Juengel, MD;  [facility];  Service: ENT;  Laterality: Right;  . MANDIBLE FRACTURE SURGERY    . SEPTOPLASTY N/A 11/17/2016   Procedure: SEPTOPLASTY;  Surgeon: Vernie MurdersPaul Juengel, MD;  [facility];  Service: ENT;  Laterality: N/A;  . TURBINATE REDUCTION Bilateral 11/17/2016   Procedure: PARTIAL INFERIOR TURBINATE REDUCTION;  Surgeon: Vernie MurdersPaul Juengel, MD;  [facility];  Service: ENT;  Laterality: Bilateral;  . Wisdom Teeth Surgery      Family Psychiatric History: Reviewed family psychiatric history from my progress note on 03/19/2018  Family History:  Family History  Problem Relation Age of Onset  . Kidney disease Neg Hx   . Bladder Cancer Neg Hx    Substance abuse history: Denies  Social History: Patient is married.  She works at [employer].  She has 2 biological children 209 and 35 years old.  She has a stepson who is 35 years old Social History   Socioeconomic History  . Marital status: Married    Spouse  name: franklin  . Number of children: 2  . Years of education: Not on file  . Highest education level: Some college, no degree  Occupational History    Comment: fulltime  Social Needs  . Financial resource strain: Somewhat hard  . Food insecurity:    Worry: Never true    Inability: Never true  . Transportation needs:    Medical: No    Non-medical: No  Tobacco Use  . Smoking status: Current Every Day Smoker    Packs/day: 0.50    Years: 15.00     Pack years: 7.50    Types: E-cigarettes, Cigarettes    Start date: 06/28/2000  . Smokeless tobacco: Never Used  Substance and Sexual Activity  . Alcohol use: Yes    Alcohol/week: 0.0 oz    Comment: special occ./ social  . Drug use: No  . Sexual activity: Yes    Birth control/protection: None  Lifestyle  . Physical activity:    Days per week: 0 days    Minutes per session: 0 min  . Stress: Very much  Relationships  . Social connections:    Talks on phone: Three times a week    Gets together: Twice a week    Attends religious service: Never    Active member of club or organization: No    Attends meetings of clubs or organizations: Never    Relationship status: Married  Other Topics Concern  . Not on file  Social History Narrative  . Not on file    Allergies:  Allergies  Allergen Reactions  . Abilify [Aripiprazole]     LIGHT SENSITIVITY    Metabolic Disorder Labs: No results found for: HGBA1C, MPG No results found for: PROLACTIN Lab Results  Component Value Date   CHOL 150 05/25/2015   TRIG 75 05/25/2015   HDL 48 05/25/2015   LDLCALC 87 05/25/2015   Lab Results  Component Value Date   TSH 1.866 03/30/2015    Therapeutic Level Labs: No results found for: LITHIUM No results found for: VALPROATE No components found for:  CBMZ  Current Medications: Current Outpatient Medications  Medication Sig Dispense Refill  . doxepin (SINEQUAN) 10 MG capsule Take 1-2 capsules (10-20 mg total) by mouth at bedtime as needed. For sleep 90 capsule 1  . FLUoxetine (PROZAC) 20 MG capsule TAKE 3 CAPSULES BY MOUTH  DAILY 270 capsule 0  . fluticasone (FLONASE) 50 MCG/ACT nasal spray Place 2 sprays into both nostrils daily.    . hydrOXYzine (VISTARIL) 25 MG capsule Take 1-2 capsules (25-50 mg total) by mouth 3 times/day as needed-between meals & bedtime for anxiety (sleep). 180 capsule 1  . ibuprofen (ADVIL,MOTRIN) 600 MG tablet Take 1 tablet (600 mg total) by mouth every 8  (eight) hours as needed. 15 tablet 0  . lamoTRIgine (LAMICTAL) 25 MG tablet Take 1 tablet (25 mg total) by mouth daily with supper. 90 tablet 1  . medroxyPROGESTERone (DEPO-PROVERA) 150 MG/ML injection Inject into the muscle.    . Multiple Vitamins-Minerals (MULTIVITAMIN GUMMIES ADULT) CHEW Chew by mouth.     No current facility-administered medications for this visit.      Musculoskeletal: Strength & Muscle Tone: within normal limits Gait & Station: normal Patient leans: N/A  Psychiatric Specialty Exam: Review of Systems  Psychiatric/Behavioral: Positive for depression. The patient is nervous/anxious.   All other systems reviewed and are negative.   Blood pressure 105/69, pulse 83, temperature 98.3 F (36.8 C), temperature source Oral, weight 150 lb 12.8 oz (  68.4 kg).Body mass index is 23.62 kg/m.  General Appearance: Casual  Eye Contact:  Fair  Speech:  Clear and Coherent  Volume:  Normal  Mood:  Anxious  Affect:  Congruent  Thought Process:  Goal Directed and Descriptions of Associations: Intact  Orientation:  Full (Time, Place, and Person)  Thought Content: Logical   Suicidal Thoughts:  No  Homicidal Thoughts:  No  Memory:  Immediate;   Fair Recent;   Fair Remote;   Fair  Judgement:  Fair  Insight:  Fair  Psychomotor Activity:  Normal  Concentration:  Concentration: Fair and Attention Span: Fair  Recall:  [employer] of Knowledge: Fair  Language: Fair  Akathisia:  No  Handed:  Right  AIMS (if indicated): na  Assets:  Communication Skills Desire for Improvement Social Support  ADL's:  Intact  Cognition: WNL  Sleep:  Fair   Screenings:phq 9, gad 7   Assessment and Plan: Cassi is a 37 year-old Caucasian female who is married, employed, lives in [place], has a history of depression, anxiety, presented to the clinic today for a follow-up visit.  Patient continues to have psychosocial stressors however is coping better.  She reports good response to the  medications.  Discussed with patient to start psychotherapy, patient was referred to Ms. Felecia Jan.  Plan as noted below.  Plan MDD Continue Prozac 60 mg p.o. daily Continue Lamictal 25 mg p.o. daily Continue hydroxyzine 25-50 mg p.o. twice daily as needed at bedtime.   PHQ 9 equals 6  For GAD Continue Prozac as prescribed Continue hydroxyzine 25-50 mg p.o. PRN GAD 7 equals 10 Referred for CBT with Ms. Felecia Jan  For social anxiety disorder Referred for CBT  For insomnia Doxepin 10-20 mg p.o. nightly as needed  Follow-up in clinic in 2 months or sooner if needed.  More than 50 % of the time was spent for psychoeducation and supportive psychotherapy and care coordination.  This note was generated in part or whole with voice recognition software. Voice recognition is usually quite accurate but there are transcription errors that can and very often do occur. I apologize for any typographical errors that were not detected and corrected.         [name], MD 05/21/2018, 3:57 PM

## 2018-06-09 ENCOUNTER — Ambulatory Visit
Admission: EM | Admit: 2018-06-09 | Discharge: 2018-06-09 | Disposition: A | Discharge status: Home / Self Care | Payer: BLUE CROSS/BLUE SHIELD | Attending: Emergency Medicine | Admitting: Emergency Medicine

## 2018-06-09 ENCOUNTER — Encounter: Payer: Self-pay | Admitting: Gynecology

## 2018-06-09 ENCOUNTER — Ambulatory Visit
Admit: 2018-06-09 | Discharge: 2018-06-09 | Disposition: A | Discharge status: Home / Self Care | Payer: BLUE CROSS/BLUE SHIELD | Attending: Emergency Medicine | Admitting: Emergency Medicine

## 2018-06-09 ENCOUNTER — Other Ambulatory Visit: Payer: Self-pay

## 2018-06-09 DIAGNOSIS — F419 Anxiety disorder, unspecified: Secondary | ICD-10-CM | POA: Insufficient documentation

## 2018-06-09 DIAGNOSIS — N390 Urinary tract infection, site not specified: Secondary | ICD-10-CM | POA: Diagnosis present

## 2018-06-09 DIAGNOSIS — N136 Pyonephrosis: Secondary | ICD-10-CM | POA: Diagnosis not present

## 2018-06-09 DIAGNOSIS — F17210 Nicotine dependence, cigarettes, uncomplicated: Secondary | ICD-10-CM | POA: Insufficient documentation

## 2018-06-09 DIAGNOSIS — M545 Low back pain: Secondary | ICD-10-CM | POA: Insufficient documentation

## 2018-06-09 DIAGNOSIS — Z79899 Other long term (current) drug therapy: Secondary | ICD-10-CM | POA: Insufficient documentation

## 2018-06-09 DIAGNOSIS — K589 Irritable bowel syndrome without diarrhea: Secondary | ICD-10-CM | POA: Diagnosis not present

## 2018-06-09 DIAGNOSIS — Z87442 Personal history of urinary calculi: Secondary | ICD-10-CM | POA: Insufficient documentation

## 2018-06-09 DIAGNOSIS — N200 Calculus of kidney: Secondary | ICD-10-CM | POA: Insufficient documentation

## 2018-06-09 DIAGNOSIS — F331 Major depressive disorder, recurrent, moderate: Secondary | ICD-10-CM | POA: Diagnosis not present

## 2018-06-09 LAB — PREGNANCY, URINE: Preg Test, Ur: NEGATIVE

## 2018-06-09 LAB — URINALYSIS, COMPLETE (UACMP) WITH MICROSCOPIC
BACTERIA UA: NONE SEEN
Bilirubin Urine: NEGATIVE
Glucose, UA: NEGATIVE mg/dL
Ketones, ur: NEGATIVE mg/dL
Leukocytes, UA: NEGATIVE
NITRITE: NEGATIVE
Protein, ur: NEGATIVE mg/dL
SPECIFIC GRAVITY, URINE: 1.01 (ref 1.005–1.030)
pH: 7 (ref 5.0–8.0)

## 2018-06-09 MED ORDER — KETOROLAC TROMETHAMINE 60 MG/2ML IM SOLN
60.0000 mg | Freq: Once | INTRAMUSCULAR | Status: AC
  Administered 2018-06-09: 60 mg via INTRAMUSCULAR

## 2018-06-09 MED ORDER — HYDROCODONE-ACETAMINOPHEN 5-325 MG PO TABS: 1.0000 | ORAL_TABLET | Freq: Four times a day (QID) | ORAL | 0 refills | Status: DC | PRN

## 2018-06-09 MED ORDER — TAMSULOSIN HCL 0.4 MG PO CAPS: 0.4000 mg | ORAL_CAPSULE | Freq: Every morning | ORAL | 0 refills | Status: DC

## 2018-06-09 NOTE — ED Triage Notes (Signed)
Patient c/o frequency urination / lower back pain x yesterday.

## 2018-06-09 NOTE — Discharge Instructions (Signed)
Drink plenty of fluids.  [facility].  If your pain worsens go to the emergency room

## 2018-06-09 NOTE — ED Provider Notes (Signed)
[facility]    CSN: [id number] Arrival date & time: 06/09/18  0856     History   Chief Complaint Chief Complaint  Patient presents with  . Appointment  . Urinary Tract Infection    HPI Janice Gray is a 37 y.o. female.   HPI  37 year old female presents with frequency of urination and right-sided lower back pain that started yesterday.  She is had no fever chills denies any nausea or vomiting.  She denies any dysuria.  Has had numerous episodes of urinary tract infections in the past but has not had one for the last year.  In addition she states that she has had kidney stones but this does not quite feel like a kidney stone at the present time.  She thinks that she may have caught most of this early.  Denies any vaginal discharge.  Also has been felt in the right inguinal area which she classifies as a "twinge".        Past Medical History:  Diagnosis Date  . Anxiety   . Depression   . Headache    sinus  . History of kidney stones   . Microscopic hematuria   . Motion sickness    cars  . Wears contact lenses     Patient Active Problem List   Diagnosis Date Noted  . Chronic seasonal allergic rhinitis due to pollen 08/02/2016  . Major depressive disorder, recurrent episode, moderate (HCC) 03/30/2016  . Social anxiety disorder 03/30/2016  . Irritable bowel syndrome with constipation 11/24/2015  . Calculus of kidney 11/24/2015  . Anxiety 11/04/2015  . Cigarette smoker 11/04/2015  . Ureteral stone 08/30/2015  . Hydronephrosis with urinary obstruction due to ureteral calculus 08/30/2015  . Microscopic hematuria 08/30/2015  . History of nephrolithiasis 08/30/2015  . 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 Depression, major, recurrent, moderate (HCC) 08/28/2015  . 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 Clinical depression 06/29/2015    Past Surgical History:  Procedure Laterality Date  . BREAST SURGERY     Implants  . ENDOSCOPIC CONCHA BULLOSA RESECTION Right  11/17/2016   Procedure: ENDOSCOPIC CONCHA BULLOSA RESECTION;  Surgeon: Vernie Murders, MD;  Location[facility];  Service: ENT;  Laterality: Right;  . MANDIBLE FRACTURE SURGERY    . SEPTOPLASTY N/A 11/17/2016   Procedure: SEPTOPLASTY;  Surgeon: Vernie Murders, MD;  Location: [facility];  Service: ENT;  Laterality: N/A;  . TURBINATE REDUCTION Bilateral 11/17/2016   Procedure: PARTIAL INFERIOR TURBINATE REDUCTION;  Surgeon: Vernie Murders, MD;  [facility];  Service: ENT;  Laterality: Bilateral;  . Wisdom Teeth Surgery      OB History   None      Home Medications    Prior to Admission medications   Medication Sig Start Date End Date Taking? Authorizing Provider  doxepin (SINEQUAN) 10 MG capsule Take 1-2 capsules (10-20 mg total) by mouth at bedtime as needed. For sleep 05/21/18  Yes Eappen, Levin Bacon, MD  FLUoxetine (PROZAC) 20 MG capsule TAKE 3 CAPSULES BY MOUTH  DAILY 05/07/18  Yes Eappen, Saramma, MD  fluticasone (FLONASE) 50 MCG/ACT nasal spray Place 2 sprays into both nostrils daily.   Yes [provider]  hydrOXYzine (VISTARIL) 25 MG capsule Take 1-2 capsules (25-50 mg total) by mouth 3 times/day as needed-between meals & bedtime for anxiety (sleep). 05/21/18  Yes [name], MD  ibuprofen (ADVIL,MOTRIN) 600 MG tablet Take 1 tablet (600 mg total) by mouth every 8 (eight) hours as needed. 08/20/15  Yes Dolores Frame,  Acc[place]ek SinkJade J, MD  lamoTRIgine (LAMICTAL) 25 MG tablet Take 1 tablet (25 mg total) by mouth daily with supper. 05/21/18  Yes Jomarie LongsEappen, Saramma, MD  medroxyPROGESTERone (DEPO-PROVERA) 150 MG/ML injection Inject into the muscle.   Yes [provider]  Multiple Vitamins-Minerals (MULTIVITAMIN GUMMIES ADULT) CHEW Chew by mouth.   Yes [provider]  HYDROcodone-acetaminophen (NORCO/VICODIN) 5-325 MG tablet Take 1-2 tablets by mouth every 6 (six) hours as needed for severe pain. 06/09/18   Lutricia Feiloemer, Illana Nolting P, PA-C  tamsulosin (FLOMAX) 0.4 MG  CAPS capsule Take 1 capsule (0.4 mg total) by mouth every morning. 06/09/18   Lutricia Feiloemer, Denielle Bayard P, PA-C    Family History Family History  Problem Relation Age of Onset  . Kidney disease Neg Hx   . Bladder Cancer Neg Hx     Social History Social History   Tobacco Use  . Smoking status: Current Every Day Smoker    Packs/day: 0.50    Years: 15.00    Pack years: 7.50    Types: E-cigarettes, Cigarettes    Start date: 06/28/2000  . Smokeless tobacco: Never Used  Substance Use Topics  . Alcohol use: Yes    Alcohol/week: 0.0 oz    Comment: special occ./ social  . Drug use: No     Allergies   Abilify [aripiprazole]   Review of Systems Review of Systems  Constitutional: Negative for activity change, appetite change, chills, fatigue and fever.  Genitourinary: Positive for frequency. Negative for dysuria, vaginal bleeding, vaginal discharge and vaginal pain.  All other systems reviewed and are negative.    Physical Exam Triage Vital Signs ED Triage Vitals  Enc Vitals Group     BP 06/09/18 0911 105/79     Pulse Rate 06/09/18 0911 84     Resp 06/09/18 0911 16     Temp 06/09/18 0911 98.3 F (36.8 C)     Temp Source 06/09/18 0911 Oral     SpO2 06/09/18 0911 98 %     Weight 06/09/18 0908 150 lb (68 kg)     Height 06/09/18 0908 5\' 6"  (1.676 m)     Head Circumference --      Peak Flow --      Pain Score 06/09/18 0908 2     Pain Loc --      Pain Edu? --      Excl. in GC? --    No data found.  Updated Vital Signs BP 105/79 (BP Location: Left Arm)   Pulse 84   Temp 98.3 F (36.8 C) (Oral)   Resp 16   Ht 5\' 6"  (1.676 m)   Wt 150 lb (68 kg)   SpO2 98%   BMI 24.21 kg/m   Visual Acuity Right Eye Distance:   Left Eye Distance:   Bilateral Distance:    Right Eye Near:   Left Eye Near:    Bilateral Near:     Physical Exam  Constitutional: She is oriented to person, place, and time. She appears well-developed and well-nourished. No distress.  HENT:  Head:  Normocephalic.  Eyes: Pupils are equal, round, and reactive to light. Right eye exhibits no discharge. Left eye exhibits no discharge.  Neck: Normal range of motion.  Pulmonary/Chest: Effort normal and breath sounds normal.  Abdominal: Soft. Bowel sounds are normal. She exhibits no distension. There is tenderness. There is no rebound and no guarding.  Demonstrates very mild tenderness in the right inguinal area.  Musculoskeletal: Normal range of motion.  Neurological: She is  alert and oriented to person, place, and time.  Skin: Skin is warm and dry. She is not diaphoretic.  Psychiatric: She has a normal mood and affect. Her behavior is normal. Judgment and thought content normal.  Nursing note and vitals reviewed.    UC Treatments / Results  Labs (all labs ordered are listed, but only abnormal results are displayed) Labs Reviewed  URINALYSIS, COMPLETE (UACMP) WITH MICROSCOPIC - Abnormal; Notable for the following components:      Result Value   Hgb urine dipstick MODERATE (*)    All other components within normal limits  PREGNANCY, URINE    EKG None  Radiology Ct Abdomen Pelvis Wo Contrast  Result Date: 06/09/2018 CLINICAL DATA:  37 year old with right flank pain and chills. Stone disease suspected. EXAM: CT ABDOMEN AND PELVIS WITHOUT CONTRAST TECHNIQUE: Multidetector CT imaging of the abdomen and pelvis was performed following the standard protocol without IV contrast. COMPARISON:  08/21/2015 FINDINGS: Lower chest: Lung bases are clear. No pleural effusions. Breast implants are partially visualized. Hepatobiliary: Normal appearance of the liver and gallbladder. No significant biliary dilatation. Pancreas: Unremarkable. No pancreatic ductal dilatation or surrounding inflammatory changes. Spleen: Normal in size without focal abnormality. Adrenals/Urinary Tract: Normal adrenal glands. Normal appearance the left kidney without hydronephrosis. There is a 0.5 cm exophytic structure  involving the lateral left kidney which is too small to definitively characterize but could represent a small exophytic cyst. Urinary bladder is decompressed. 3 mm stone at the right ureterovesical junction. Moderate right hydroureteronephrosis with inflammatory changes around the distal right ureter. There is a punctate stone in the right kidney upper pole. Stomach/Bowel: Stomach is within normal limits. Appendix appears normal. No evidence of bowel wall thickening, distention, or inflammatory changes. Large amount of stool near the rectosigmoid junction. Vascular/Lymphatic: No significant vascular findings are present. No enlarged abdominal or pelvic lymph nodes. Reproductive: 2.6 cm low-density structure in the region of left ovary. This probably represents an ovarian cyst but poorly characterized on this study. No gross abnormality to the uterus or right adnexal region. Other: No significant free fluid.  Negative for free air. Musculoskeletal: No acute abnormality. IMPRESSION: Moderate right hydroureteronephrosis due to a 3 mm stone near the right ureterovesical junction. Tiny right kidney stone. Small exophytic structure involving the left kidney which is too small to definitively characterize. Possible left ovarian cyst. Electronically Signed   By: Richarda Overlie M.D.   On: 06/09/2018 10:35    Procedures Procedures (including critical care time)  Medications Ordered in UC Medications  ketorolac (TORADOL) injection 60 mg (60 mg Intramuscular Given 06/09/18 0954)  Received substantial relief of her pain after the Toradol injection  Initial Impression / Assessment and Plan / UC Course  I have reviewed the triage vital signs and the nursing notes.  Pertinent labs & imaging results that were available during my care of the patient were reviewed by me and considered in my medical decision making (see chart for details).     Plan: 1. Test/x-ray results and diagnosis reviewed with patient 2. rx as per  orders; risks, benefits, potential side effects reviewed with patient 3. Recommend supportive treatment with increased fluids.   provide her with a screen to screen all of her urines for a capture of the  stone.  Given her a prescription for pain medication and Flomax.  Make an appointment with [facility] on Monday.  If she is worsening she should go to the emergency room. 4. F/u prn if symptoms worsen or don't  improve  Final Clinical Impressions(s) / UC Diagnoses   Final diagnoses:  Kidney stone on right side     Discharge Instructions     Drink plenty of fluids.  Make a follow-up appointment with [facility] on Monday.  If your pain worsens go to the emergency room    ED Prescriptions    Medication Sig Dispense Auth. Provider   HYDROcodone-acetaminophen (NORCO/VICODIN) 5-325 MG tablet Take 1-2 tablets by mouth every 6 (six) hours as needed for severe pain. 20 tablet Ovid Curd P, PA-C   tamsulosin (FLOMAX) 0.4 MG CAPS capsule Take 1 capsule (0.4 mg total) by mouth every morning. 30 capsule Lutricia Feil, PA-C     Controlled Substance Prescriptions [place] Controlled Substance Registry consulted? [place] substance registry was accessed.  The patient's last prescription for opioid narcotics was in 2017.   Lutricia Feil, PA-C 06/09/18 1755

## 2018-07-04 ENCOUNTER — Other Ambulatory Visit: Payer: Self-pay | Admitting: Neurosurgery

## 2018-07-04 DIAGNOSIS — N939 Abnormal uterine and vaginal bleeding, unspecified: Secondary | ICD-10-CM

## 2018-07-05 ENCOUNTER — Other Ambulatory Visit: Payer: Self-pay | Admitting: Neurosurgery

## 2018-07-05 DIAGNOSIS — N939 Abnormal uterine and vaginal bleeding, unspecified: Secondary | ICD-10-CM

## 2018-07-09 ENCOUNTER — Ambulatory Visit
Admission: RE | Admit: 2018-07-09 | Discharge: 2018-07-09 | Disposition: A | Discharge status: Home / Self Care | Payer: BLUE CROSS/BLUE SHIELD | Source: Ambulatory Visit | Attending: Neurosurgery | Admitting: Neurosurgery

## 2018-07-09 DIAGNOSIS — Z8041 Family history of malignant neoplasm of ovary: Secondary | ICD-10-CM | POA: Insufficient documentation

## 2018-07-09 DIAGNOSIS — N939 Abnormal uterine and vaginal bleeding, unspecified: Secondary | ICD-10-CM | POA: Diagnosis present

## 2018-07-09 DIAGNOSIS — N83201 Unspecified ovarian cyst, right side: Secondary | ICD-10-CM | POA: Diagnosis not present

## 2018-07-09 DIAGNOSIS — R102 Pelvic and perineal pain: Secondary | ICD-10-CM | POA: Diagnosis not present

## 2018-07-09 DIAGNOSIS — N83202 Unspecified ovarian cyst, left side: Secondary | ICD-10-CM | POA: Insufficient documentation

## 2018-07-23 ENCOUNTER — Ambulatory Visit: Payer: BLUE CROSS/BLUE SHIELD | Admitting: Psychiatry

## 2018-08-19 ENCOUNTER — Other Ambulatory Visit: Payer: Self-pay | Admitting: Psychiatry

## 2018-08-19 DIAGNOSIS — F331 Major depressive disorder, recurrent, moderate: Secondary | ICD-10-CM

## 2018-08-19 DIAGNOSIS — F411 Generalized anxiety disorder: Secondary | ICD-10-CM

## 2018-08-19 DIAGNOSIS — F4010 Social phobia, unspecified: Secondary | ICD-10-CM

## 2018-10-01 ENCOUNTER — Encounter: Payer: Self-pay | Admitting: Psychiatry

## 2018-10-01 ENCOUNTER — Ambulatory Visit (INDEPENDENT_AMBULATORY_CARE_PROVIDER_SITE_OTHER): Payer: BLUE CROSS/BLUE SHIELD | Admitting: Psychiatry

## 2018-10-01 ENCOUNTER — Other Ambulatory Visit: Payer: Self-pay

## 2018-10-01 VITALS — BP 108/73 | HR 85 | Temp 98.0°F | Wt 152.2 lb

## 2018-10-01 DIAGNOSIS — F4010 Social phobia, unspecified: Secondary | ICD-10-CM

## 2018-10-01 DIAGNOSIS — F5105 Insomnia due to other mental disorder: Secondary | ICD-10-CM

## 2018-10-01 DIAGNOSIS — F3341 Major depressive disorder, recurrent, in partial remission: Secondary | ICD-10-CM | POA: Diagnosis not present

## 2018-10-01 DIAGNOSIS — F411 Generalized anxiety disorder: Secondary | ICD-10-CM

## 2018-10-01 NOTE — Progress Notes (Signed)
BH MD  OP Progress Note  10/01/2018 3:57 PM Nakari Bracknell  MRN:  [id number]  Chief Complaint:  ' I am here for follow up." Chief Complaint    Follow-up; Medication Refill     HPI: Janice Gray is a 37 year old Caucasian female, married, employed, lives in [place], has a history of depression, anxiety, presented to the clinic today for a follow-up visit.  Patient today reports she is currently doing better on the current medication regimen.  She reports she is compliant with her medications as prescribed.  She reports sleep is improved and she has not been using her sleep aid, doxepin as much.  She also does not use the hydroxyzine much since she feels much better now.  She reports her grandmother is currently doing much better and that may also have something to do with the way she feels.  She reports she reached out to Ms. Felecia Jan for psychotherapy however she will not be able to afford it and hence decided not to go.  She reports she feels much better now and does not think she needs psychotherapy sessions right now.  She will let writer know if her symptoms worsens.  Patient denies any suicidality.  Patient denies any perceptual disturbances. Visit Diagnosis:    ICD-10-CM   1. MDD (major depressive disorder), recurrent, in partial remission (HCC) F33.41   2. Social anxiety disorder F40.10   3. GAD (generalized anxiety disorder) F41.1   4. Insomnia due to mental condition F51.05    resolved    Past Psychiatric History: Reviewed past psychiatric history from my progress note on 03/19/2018.  Past trials of medications like Abilify-light sensitivity, Seroquel-groggy, Wellbutrin-increased anxiety, Xanax.  Past Medical History:  Past Medical History:  Diagnosis Date  . Anxiety   . Depression   . Headache    sinus  . History of kidney stones   . Microscopic hematuria   . Motion sickness    cars  . Wears contact lenses     Past Surgical History:  Procedure Laterality Date  .  BREAST SURGERY     Implants  . ENDOSCOPIC CONCHA BULLOSA RESECTION Right 11/17/2016   Procedure: ENDOSCOPIC CONCHA BULLOSA RESECTION;  Surgeon: Vernie Murders, MD;  Location: [facility];  Service: ENT;  Laterality: Right;  . MANDIBLE FRACTURE SURGERY    . SEPTOPLASTY N/A 11/17/2016   Procedure: SEPTOPLASTY;  Surgeon: Vernie Murders, MD;  Location: [facility];  Service: ENT;  Laterality: N/A;  . TURBINATE REDUCTION Bilateral 11/17/2016   Procedure: PARTIAL INFERIOR TURBINATE REDUCTION;  Surgeon: Vernie Murders, MD;  Location: [facility];  Service: ENT;  Laterality: Bilateral;  . Wisdom Teeth Surgery      Family Psychiatric History: Have reviewed family psychiatric history from my progress note on 03/19/2018  Family History:  Family History  Problem Relation Age of Onset  . Kidney disease Neg Hx   . Bladder Cancer Neg Hx     Social History: Reviewed social history from my progress note on 03/19/2018 Social History   Socioeconomic History  . Marital status: Married    Spouse name: franklin  . Number of children: 2  . Years of education: Not on file  . Highest education level: Some college, no degree  Occupational History    Comment: fulltime  Social Needs  . Financial resource strain: Somewhat hard  . Food insecurity:    Worry: Never true    Inability: Never true  . Transportation needs:    Medical: No  Non-medical: No  Tobacco Use  . Smoking status: Current Every Day Smoker    Packs/day: 0.50    Years: 15.00    Pack years: 7.50    Types: E-cigarettes, Cigarettes    Start date: 06/28/2000  . Smokeless tobacco: Never Used  Substance and Sexual Activity  . Alcohol use: Yes    Alcohol/week: 0.0 standard drinks    Comment: special occ./ social  . Drug use: No  . Sexual activity: Yes    Birth control/protection: None  Lifestyle  . Physical activity:    Days per week: 0 days    Minutes per session: 0 min  . Stress: Very much  Relationships  .  Social connections:    Talks on phone: Three times a week    Gets together: Twice a week    Attends religious service: Never    Active member of club or organization: No    Attends meetings of clubs or organizations: Never    Relationship status: Married  Other Topics Concern  . Not on file  Social History Narrative  . Not on file    Allergies:  Allergies  Allergen Reactions  . Abilify [Aripiprazole]     LIGHT SENSITIVITY    Metabolic Disorder Labs: No results found for: HGBA1C, MPG No results found for: PROLACTIN Lab Results  Component Value Date   CHOL 150 05/25/2015   TRIG 75 05/25/2015   HDL 48 05/25/2015   LDLCALC 87 05/25/2015   Lab Results  Component Value Date   TSH 1.866 03/30/2015    Therapeutic Level Labs: No results found for: LITHIUM No results found for: VALPROATE No components found for:  CBMZ  Current Medications: Current Outpatient Medications  Medication Sig Dispense Refill  . doxepin (SINEQUAN) 10 MG capsule Take 1-2 capsules (10-20 mg total) by mouth at bedtime as needed. For sleep 90 capsule 1  . FLUoxetine (PROZAC) 20 MG capsule TAKE 3 CAPSULES BY MOUTH  DAILY 270 capsule 0  . fluticasone (FLONASE) 50 MCG/ACT nasal spray Place 2 sprays into both nostrils daily.    Marland Kitchen HYDROcodone-acetaminophen (NORCO/VICODIN) 5-325 MG tablet Take 1-2 tablets by mouth every 6 (six) hours as needed for severe pain. 20 tablet 0  . hydrOXYzine (VISTARIL) 25 MG capsule Take 1-2 capsules (25-50 mg total) by mouth 3 times/day as needed-between meals & bedtime for anxiety (sleep). 180 capsule 1  . ibuprofen (ADVIL,MOTRIN) 600 MG tablet Take 1 tablet (600 mg total) by mouth every 8 (eight) hours as needed. 15 tablet 0  . lamoTRIgine (LAMICTAL) 25 MG tablet Take 1 tablet (25 mg total) by mouth daily with supper. 90 tablet 1  . Multiple Vitamins-Minerals (MULTIVITAMIN GUMMIES ADULT) CHEW Chew by mouth.    . tamsulosin (FLOMAX) 0.4 MG CAPS capsule Take 1 capsule (0.4 mg  total) by mouth every morning. 30 capsule 0  . cetirizine (ZYRTEC) 10 MG tablet Take by mouth.    Nance Pew 0.35 MG tablet      No current facility-administered medications for this visit.      Musculoskeletal: Strength & Muscle Tone: within normal limits Gait & Station: normal Patient leans: N/A  Psychiatric Specialty Exam: Review of Systems  Psychiatric/Behavioral: Positive for depression (improved). The patient is nervous/anxious (improved) and has insomnia (improved).   All other systems reviewed and are negative.   Blood pressure 108/73, pulse 85, temperature 98 F (36.7 C), temperature source Oral, weight 152 lb 3.2 oz (69 kg).Body mass index is 24.57 kg/m.  General Appearance: Casual  Eye Contact:  Fair  Speech:  Normal Rate  Volume:  Normal  Mood:  Euthymic  Affect:  Congruent  Thought Process:  Goal Directed and Descriptions of Associations: Intact  Orientation:  Full (Time, Place, and Person)  Thought Content: Logical   Suicidal Thoughts:  No  Homicidal Thoughts:  No  Memory:  Immediate;   Fair Recent;   Fair Remote;   Fair  Judgement:  Fair  Insight:  Fair  Psychomotor Activity:  Normal  Concentration:  Concentration: Fair and Attention Span: Fair  Recall:  [employer] of Knowledge: Fair  Language: Fair  Akathisia:  No  Handed:  Right  AIMS (if indicated): NA  Assets:  Communication Skills Desire for Improvement Social Support  ADL's:  Intact  Cognition: WNL  Sleep:  improved   Screenings:PHQ 9   Assessment and Plan: Samayra is a 37 year old Caucasian female who is married, employed, lives in [place], has a history of depression, anxiety, presented to the clinic today for a follow-up visit.  Patient is currently improving on the current medication regimen.  Will continue plan as noted below.  Plan MDD Continue Prozac 60 mg p.o. daily Continue Lamictal 25 mg p.o. daily Continue hydroxyzine 25-50 mg p.o. twice daily as needed.   PHQ 9 equals  4  GAD Continue Prozac as prescribed   For social anxiety disorder Patient reports symptoms as improving  For insomnia-resolving She continues to use doxepin 10-20 mg as needed, has been using it less frequently.  Follow-up in clinic in 2-3 months or sooner if needed.  More than 50 % of the time was spent for psychoeducation and supportive psychotherapy and care coordination.  This note was generated in part or whole with voice recognition software. Voice recognition is usually quite accurate but there are transcription errors that can and very often do occur. I apologize for any typographical errors that were not detected and corrected.       [name], MD 10/02/2018, 8:50 AM

## 2018-10-02 ENCOUNTER — Encounter: Payer: Self-pay | Admitting: Psychiatry

## 2018-10-20 ENCOUNTER — Other Ambulatory Visit: Payer: Self-pay | Admitting: Psychiatry

## 2018-10-20 DIAGNOSIS — F331 Major depressive disorder, recurrent, moderate: Secondary | ICD-10-CM

## 2018-10-26 HISTORY — PX: TUBAL LIGATION: SHX77

## 2018-12-24 ENCOUNTER — Ambulatory Visit (INDEPENDENT_AMBULATORY_CARE_PROVIDER_SITE_OTHER): Payer: BLUE CROSS/BLUE SHIELD | Admitting: Psychiatry

## 2018-12-24 ENCOUNTER — Encounter: Payer: Self-pay | Admitting: Psychiatry

## 2018-12-24 VITALS — BP 112/76 | HR 80 | Ht 67.0 in | Wt 159.0 lb

## 2018-12-24 DIAGNOSIS — F5105 Insomnia due to other mental disorder: Secondary | ICD-10-CM

## 2018-12-24 DIAGNOSIS — F3341 Major depressive disorder, recurrent, in partial remission: Secondary | ICD-10-CM

## 2018-12-24 DIAGNOSIS — F4010 Social phobia, unspecified: Secondary | ICD-10-CM | POA: Diagnosis not present

## 2018-12-24 DIAGNOSIS — F411 Generalized anxiety disorder: Secondary | ICD-10-CM | POA: Diagnosis not present

## 2018-12-24 MED ORDER — FLUOXETINE HCL 20 MG PO CAPS: 60.0000 mg | ORAL_CAPSULE | Freq: Every day | ORAL | 1 refills | Status: DC

## 2018-12-24 NOTE — Progress Notes (Signed)
BH MD OP Progress Note  12/24/2018 4:22 PM Tobey BrideLiana Gray  MRN:  [id number]  Chief Complaint: ' I am here for follow up." Chief Complaint    Follow-up     HPI: Janice Gray is a 38 year old Caucasian female, married, employed, lives in [place], has a history of depression, anxiety, presented to the clinic today for a follow-up visit.  She today reports that she continues to improve on the current medication regimen with regards to her mood symptoms.  She is compliant on her Lamictal, Prozac.  She denies any side effects.  She reports sleep is fair.  She rarely uses the doxepin.  She reports she uses hydroxyzine as needed when she needs it for sleep which helps.  Patient is concerned about her weight gain.  She has gained at least 5 pounds since her last visit here.  Patient reports she has not been exercising.  She is also unable to manage her weight since she is unable to manage what she eats due to her low budget.  Some time was spent providing education about calorie counting, physical activity.  Also discussed resources like food bank.  Printed out handouts and gave to patient.  Patient denies any suicidality.  Patient denies any perceptual disturbances.   Visit Diagnosis:    ICD-10-CM   1. MDD (major depressive disorder), recurrent, in partial remission (HCC) F33.41   2. Social anxiety disorder F40.10 FLUoxetine (PROZAC) 20 MG capsule  3. GAD (generalized anxiety disorder) F41.1 FLUoxetine (PROZAC) 20 MG capsule  4. Insomnia due to mental condition F51.05     Past Psychiatric History: Reviewed past psychiatric history from my progress note on 03/19/2018.  Past trials of medications like Abilify-light sensitivity, Seroquel-groggy, Wellbutrin-increased anxiety, Xanax  Past Medical History:  Past Medical History:  Diagnosis Date  . Anxiety   . Depression   . Headache    sinus  . History of kidney stones   . Microscopic hematuria   . Motion sickness    cars  . Wears contact lenses      Past Surgical History:  Procedure Laterality Date  . BREAST SURGERY     Implants  . ENDOSCOPIC CONCHA BULLOSA RESECTION Right 11/17/2016   Procedure: ENDOSCOPIC CONCHA BULLOSA RESECTION;  Surgeon: Vernie MurdersPaul Juengel, MD;  [facility];  Service: ENT;  Laterality: Right;  . MANDIBLE FRACTURE SURGERY    . SEPTOPLASTY N/A 11/17/2016   Procedure: SEPTOPLASTY;  Surgeon: Vernie MurdersPaul Juengel, MD;  [facility];  Service: ENT;  Laterality: N/A;  . TUBAL LIGATION Bilateral 10/26/2018  . TURBINATE REDUCTION Bilateral 11/17/2016   Procedure: PARTIAL INFERIOR TURBINATE REDUCTION;  Surgeon: Vernie MurdersPaul Juengel, MD;  [facility];  Service: ENT;  Laterality: Bilateral;  . Wisdom Teeth Surgery      Family Psychiatric History: I have reviewed family psychiatric history from my progress note on 03/19/2018  Family History:  Family History  Problem Relation Age of Onset  . Kidney disease Neg Hx   . Bladder Cancer Neg Hx     Social History: Reviewed social history from my progress note on 03/19/2018 Social History   Socioeconomic History  . Marital status: Married    Spouse name: franklin  . Number of children: 2  . Years of education: Not on file  . Highest education level: Some college, no degree  Occupational History    Comment: fulltime  Social Needs  . Financial resource strain: Somewhat hard  . Food insecurity:    Worry: Never true  Inability: Never true  . Transportation needs:    Medical: No    Non-medical: No  Tobacco Use  . Smoking status: Current Every Day Smoker    Packs/day: 0.50    Years: 15.00    Pack years: 7.50    Types: E-cigarettes, Cigarettes    Start date: 06/28/2000  . Smokeless tobacco: Never Used  Substance and Sexual Activity  . Alcohol use: Yes    Alcohol/week: 0.0 standard drinks    Comment: special occ./ social  . Drug use: No  . Sexual activity: Yes    Partners: Male    Birth control/protection: None, Surgical,  Injection  Lifestyle  . Physical activity:    Days per week: 0 days    Minutes per session: 0 min  . Stress: Very much  Relationships  . Social connections:    Talks on phone: Three times a week    Gets together: Twice a week    Attends religious service: Never    Active member of club or organization: No    Attends meetings of clubs or organizations: Never    Relationship status: Married  Other Topics Concern  . Not on file  Social History Narrative  . Not on file    Allergies:  Allergies  Allergen Reactions  . Abilify [Aripiprazole]     LIGHT SENSITIVITY    Metabolic Disorder Labs: No results found for: HGBA1C, MPG No results found for: PROLACTIN Lab Results  Component Value Date   CHOL 150 05/25/2015   TRIG 75 05/25/2015   HDL 48 05/25/2015   LDLCALC 87 05/25/2015   Lab Results  Component Value Date   TSH 1.866 03/30/2015    Therapeutic Level Labs: No results found for: LITHIUM No results found for: VALPROATE No components found for:  CBMZ  Current Medications: Current Outpatient Medications  Medication Sig Dispense Refill  . cetirizine (ZYRTEC) 10 MG tablet Take by mouth.    Marland Kitchen FLUoxetine (PROZAC) 20 MG capsule Take 3 capsules (60 mg total) by mouth daily. 270 capsule 1  . fluticasone (FLONASE) 50 MCG/ACT nasal spray Place 2 sprays into both nostrils daily.    . hydrOXYzine (VISTARIL) 25 MG capsule Take 1-2 capsules (25-50 mg total) by mouth 3 times/day as needed-between meals & bedtime for anxiety (sleep). 180 capsule 1  . ibuprofen (ADVIL,MOTRIN) 600 MG tablet Take 1 tablet (600 mg total) by mouth every 8 (eight) hours as needed. 15 tablet 0  . lamoTRIgine (LAMICTAL) 25 MG tablet TAKE 1 TABLET BY MOUTH  DAILY WITH SUPPER 90 tablet 1  . Multiple Vitamins-Minerals (MULTIVITAMIN GUMMIES ADULT) CHEW Chew by mouth.    . doxepin (SINEQUAN) 10 MG capsule Take 1-2 capsules (10-20 mg total) by mouth at bedtime as needed. For sleep (Patient not taking: Reported on  12/24/2018) 90 capsule 1  . naproxen sodium (ALEVE) 220 MG tablet Take by mouth.     No current facility-administered medications for this visit.      Musculoskeletal: Strength & Muscle Tone: within normal limits Gait & Station: normal Patient leans: N/A  Psychiatric Specialty Exam: Review of Systems  Psychiatric/Behavioral: Positive for depression (improving). The patient is not nervous/anxious and does not have insomnia.   All other systems reviewed and are negative.   Blood pressure 112/76, pulse 80, height 5\' 7"  (1.702 m), weight 159 lb (72.1 kg).Body mass index is 24.9 kg/m.  General Appearance: Casual  Eye Contact:  Fair  Speech:  Clear and Coherent  Volume:  Normal  Mood:  Dysphoric  Affect:  Congruent  Thought Process:  Goal Directed and Descriptions of Associations: Intact  Orientation:  Full (Time, Place, and Person)  Thought Content: Logical   Suicidal Thoughts:  No  Homicidal Thoughts:  No  Memory:  Immediate;   Fair Recent;   Fair Remote;   Fair  Judgement:  Fair  Insight:  Fair  Psychomotor Activity:  Normal  Concentration:  Concentration: Fair and Attention Span: Fair  Recall:  [employer] of Knowledge: Fair  Language: Fair  Akathisia:  No  Handed:  Right  AIMS (if indicated): denies tremors, rigidity  Assets:  Communication Skills Desire for Improvement Housing Talents/Skills Transportation  ADL's:  Intact  Cognition: WNL  Sleep:  Fair   Screenings:   Assessment and Plan: Maryem is a 38 year old Caucasian female who is married, employed, lives in [place], has a history of depression, anxiety, presented to the clinic today for a follow-up visit.  Patient continues to report improvement in her mood symptoms.  She does have continued psychosocial stressors of her children who struggle with mental health problems.  Patient also concerned about her weight gain.  Discussed the following plan.  Plan MDD-in partial remission Continue Prozac 60 mg p.o.  daily Continue Lamictal 25 mg p.o. daily Continue hydroxyzine 25 to 50 mg p.o. 3 times daily as needed  For GAD-improving Continue Prozac as prescribed  For social anxiety disorder Patient reports symptoms is improving.  For insomnia-resolving. Patient rarely uses doxepin.  She uses hydroxyzine as needed which helps.  Some time was spent educating patient about calorie counting, increasing her physical activity.  Also reached out to her social worker Ms. Nolon Rod for [employer] available.  Will call patient to provide the information.  Follow-up in clinic in 3 months or sooner if needed.  I have spent atleast 15 minutes face to face with patient today. More than 50 % of the time was spent for psychoeducation and supportive psychotherapy and care coordination.  This note was generated in part or whole with voice recognition software. Voice recognition is usually quite accurate but there are transcription errors that can and very often do occur. I apologize for any typographical errors that were not detected and corrected.          [name], MD 12/24/2018, 4:22 PM

## 2018-12-24 NOTE — Patient Instructions (Signed)

## 2018-12-26 ENCOUNTER — Telehealth: Payer: Self-pay | Admitting: Psychiatry

## 2018-12-26 NOTE — Telephone Encounter (Signed)
Called patient provided information for food pantry - [employer] [facility] [place] , Janice Gray

## 2019-03-25 ENCOUNTER — Other Ambulatory Visit: Payer: Self-pay

## 2019-03-25 ENCOUNTER — Encounter: Payer: Self-pay | Admitting: Psychiatry

## 2019-03-25 ENCOUNTER — Ambulatory Visit (INDEPENDENT_AMBULATORY_CARE_PROVIDER_SITE_OTHER): Payer: BLUE CROSS/BLUE SHIELD | Admitting: Psychiatry

## 2019-03-25 DIAGNOSIS — F411 Generalized anxiety disorder: Secondary | ICD-10-CM

## 2019-03-25 DIAGNOSIS — F330 Major depressive disorder, recurrent, mild: Secondary | ICD-10-CM

## 2019-03-25 DIAGNOSIS — F5105 Insomnia due to other mental disorder: Secondary | ICD-10-CM | POA: Diagnosis not present

## 2019-03-25 DIAGNOSIS — F4010 Social phobia, unspecified: Secondary | ICD-10-CM

## 2019-03-25 MED ORDER — SERTRALINE HCL 100 MG PO TABS: 100.0000 mg | ORAL_TABLET | Freq: Every day | ORAL | 1 refills | Status: DC

## 2019-03-25 NOTE — Progress Notes (Signed)
Virtual Visit via Video Note  I connected with Janice Gray on 03/25/19 at  4:00 PM EDT by a video enabled telemedicine application and verified that I am speaking with the correct person using two identifiers.   I discussed the limitations of evaluation and management by telemedicine and the availability of in person appointments. The patient expressed understanding and agreed to proceed.   I discussed the assessment and treatment plan with the patient. The patient was provided an opportunity to ask questions and all were answered. The patient agreed with the plan and demonstrated an understanding of the instructions.   The patient was advised to call back or seek an in-person evaluation if the symptoms worsen or if the condition fails to improve as anticipated.  I provided 15 minutes of non-face-to-face time during this encounter.   Janice Longs, MD  BH MD OP Progress Note  03/25/2019 4:57 PM Shanylah Broshears  MRN:  [id number]  Chief Complaint:  Chief Complaint    Follow-up     HPI: Janice Gray is a 38 year old Caucasian female, married, employed, lives in [place], has a history of depression, anxiety, was evaluated by telemedicine today.  Patient today reports she has been struggling with some recent anxiety and depressive symptoms due to the COVID-19 outbreak .  Her anxiety symptoms are worse than her depression.  She reports the Prozac is helpful to some extent.  She however reports she feels as though she is dealing with her problems and may be the Prozac may not be that effective.  She has noticed that she may be more irritable lately.  Patient reports sleep is good.  She rarely uses the doxepin.  Patient reports she works at [employer] and its getting overwhelming due to the current outbreak.  Patient reports her daughter was recently started on Zoloft, she was on Prozac previously.  She reports her daughter is doing much better on the Zoloft.  She reports she would like to  try the Zoloft.  Discussed with patient that we can start at 50 mg and gradually increase to 100 mg.  Provided her medication education.  Discussed with her to reach out to writer if she needs additional help.  Patient denies any suicidality, homicidality or perceptual disturbances.   Visit Diagnosis:    ICD-10-CM   1. MDD (major depressive disorder), recurrent episode, mild (HCC) F33.0 sertraline (ZOLOFT) 100 MG tablet  2. Social anxiety disorder F40.10 sertraline (ZOLOFT) 100 MG tablet  3. GAD (generalized anxiety disorder) F41.1 sertraline (ZOLOFT) 100 MG tablet  4. Insomnia due to mental condition F51.05     Past Psychiatric History: I have reviewed past psychiatric history from my progress note on 03/19/2018.  Past trials of medications like Abilify-light sensitivity, Seroquel-groggy, Wellbutrin-increased anxiety, Xanax  Past Medical History:  Past Medical History:  Diagnosis Date  . Anxiety   . Depression   . Headache    sinus  . History of kidney stones   . Microscopic hematuria   . Motion sickness    cars  . Wears contact lenses     Past Surgical History:  Procedure Laterality Date  . BREAST SURGERY     Implants  . ENDOSCOPIC CONCHA BULLOSA RESECTION Right 11/17/2016   Procedure: ENDOSCOPIC CONCHA BULLOSA RESECTION;  Surgeon: Vernie Murders, MD;  Location: [facility];  Service: ENT;  Laterality: Right;  . MANDIBLE FRACTURE SURGERY    . SEPTOPLASTY N/A 11/17/2016   Procedure: SEPTOPLASTY;  Surgeon: Vernie Murders, MD;  Location: [facility];  Service: ENT;  Laterality: N/A;  . TUBAL LIGATION Bilateral 10/26/2018  . TURBINATE REDUCTION Bilateral 11/17/2016   Procedure: PARTIAL INFERIOR TURBINATE REDUCTION;  Surgeon: Vernie Murders, MD;  Location: [facility];  Service: ENT;  Laterality: Bilateral;  . Wisdom Teeth Surgery      Family Psychiatric History: I have reviewed family psychiatric history from my progress note on 03/19/2018.  Family History:   Family History  Problem Relation Age of Onset  . Kidney disease Neg Hx   . Bladder Cancer Neg Hx     Social History: I have reviewed social history from my progress note on 03/19/2018. Social History   Socioeconomic History  . Marital status: Married    Spouse name: franklin  . Number of children: 2  . Years of education: Not on file  . Highest education level: Some college, no degree  Occupational History    Comment: fulltime  Social Needs  . Financial resource strain: Somewhat hard  . Food insecurity:    Worry: Never true    Inability: Never true  . Transportation needs:    Medical: No    Non-medical: No  Tobacco Use  . Smoking status: Current Every Day Smoker    Packs/day: 0.50    Years: 15.00    Pack years: 7.50    Types: E-cigarettes, Cigarettes    Start date: 06/28/2000  . Smokeless tobacco: Never Used  Substance and Sexual Activity  . Alcohol use: Yes    Alcohol/week: 0.0 standard drinks    Comment: special occ./ social  . Drug use: No  . Sexual activity: Yes    Partners: Male    Birth control/protection: None, Surgical, Injection  Lifestyle  . Physical activity:    Days per week: 0 days    Minutes per session: 0 min  . Stress: Very much  Relationships  . Social connections:    Talks on phone: Three times a week    Gets together: Twice a week    Attends religious service: Never    Active member of club or organization: No    Attends meetings of clubs or organizations: Never    Relationship status: Married  Other Topics Concern  . Not on file  Social History Narrative  . Not on file    Allergies:  Allergies  Allergen Reactions  . Abilify [Aripiprazole]     LIGHT SENSITIVITY    Metabolic Disorder Labs: No results found for: HGBA1C, MPG No results found for: PROLACTIN Lab Results  Component Value Date   CHOL 150 05/25/2015   TRIG 75 05/25/2015   HDL 48 05/25/2015   LDLCALC 87 05/25/2015   Lab Results  Component Value Date   TSH 1.866  03/30/2015    Therapeutic Level Labs: No results found for: LITHIUM No results found for: VALPROATE No components found for:  CBMZ  Current Medications: Current Outpatient Medications  Medication Sig Dispense Refill  . cetirizine (ZYRTEC) 10 MG tablet Take by mouth.    . doxepin (SINEQUAN) 10 MG capsule Take 1-2 capsules (10-20 mg total) by mouth at bedtime as needed. For sleep (Patient not taking: Reported on 12/24/2018) 90 capsule 1  . fluticasone (FLONASE) 50 MCG/ACT nasal spray Place 2 sprays into both nostrils daily.    . hydrOXYzine (VISTARIL) 25 MG capsule Take 1-2 capsules (25-50 mg total) by mouth 3 times/day as needed-between meals & bedtime for anxiety (sleep). 180 capsule 1  . ibuprofen (ADVIL,MOTRIN) 600 MG tablet Take 1 tablet (600  mg total) by mouth every 8 (eight) hours as needed. 15 tablet 0  . lamoTRIgine (LAMICTAL) 25 MG tablet TAKE 1 TABLET BY MOUTH  DAILY WITH SUPPER 90 tablet 1  . Multiple Vitamins-Minerals (MULTIVITAMIN GUMMIES ADULT) CHEW Chew by mouth.    . naproxen sodium (ALEVE) 220 MG tablet Take by mouth.    . sertraline (ZOLOFT) 100 MG tablet Take 1 tablet (100 mg total) by mouth daily. Start taking 50 mg for 3 days and increase after that. 30 tablet 1   No current facility-administered medications for this visit.      Musculoskeletal: Strength & Muscle Tone: UTA Gait & Station: UTA Patient leans: N/A  Psychiatric Specialty Exam: Review of Systems  Psychiatric/Behavioral: Positive for depression. The patient is nervous/anxious.   All other systems reviewed and are negative.   There were no vitals taken for this visit.There is no height or weight on file to calculate BMI.  General Appearance: Casual  Eye Contact:  Fair  Speech:  Normal Rate  Volume:  Normal  Mood:  Anxious and Depressed  Affect:  Congruent  Thought Process:  Goal Directed and Descriptions of Associations: Intact  Orientation:  Full (Time, Place, and Person)  Thought Content:  Logical   Suicidal Thoughts:  No  Homicidal Thoughts:  No  Memory:  Immediate;   Fair Recent;   Fair Remote;   Fair  Judgement:  Fair  Insight:  Good  Psychomotor Activity:  Normal  Concentration:  Concentration: Fair and Attention Span: Fair  Recall:  [employer] of Knowledge: Fair  Language: Fair  Akathisia:  No  Handed:  Right  AIMS (if indicated): denies tremors, rigidity,stiffness  Assets:  Communication Skills Desire for Improvement Social Support  ADL's:  Intact  Cognition: WNL  Sleep:  Fair   Screenings:   Assessment and Plan: Jerita is a 38 year old Caucasian female who is married, employed, lives in [place], has a history of depression, anxiety, was evaluated by telemedicine today.  Patient reports feeling overwhelmed which is affecting her mood in general.  She does have psychosocial stressors of the current corona outbreak as well as her children being homeschooled.  Patient is interested in changing her Prozac to Zoloft since it worked well for her daughter.  Will make the following medication changes.  Plan MDD -unstable Discontinue Prozac. Start Zoloft 50 mg for 3 days and increase to 100 mg p.o. daily Lamotrigine 25 mg p.o. daily Hydroxyzine 25 to 50 mg p.o. 3 times daily as needed  For GAD- unstable Zoloft as prescribed.  For social anxiety disorder-improving Progressing, will continue to monitor closely.  For insomnia-resolving Patient rarely uses doxepin.  Hydroxyzine as needed is also available.  Follow-up in clinic in 2 to 3 months or sooner if needed.  I have spent atleast 15 minutes non face to face with patient today. More than 50 % of the time was spent for psychoeducation and supportive psychotherapy and care coordination.  This note was generated in part or whole with voice recognition software. Voice recognition is usually quite accurate but there are transcription errors that can and very often do occur. I apologize for any typographical  errors that were not detected and corrected.        Janice Longs, MD 03/25/2019, 4:57 PM

## 2019-04-11 ENCOUNTER — Telehealth: Payer: Self-pay

## 2019-04-11 DIAGNOSIS — F411 Generalized anxiety disorder: Secondary | ICD-10-CM

## 2019-04-11 DIAGNOSIS — F4010 Social phobia, unspecified: Secondary | ICD-10-CM

## 2019-04-11 DIAGNOSIS — F330 Major depressive disorder, recurrent, mild: Secondary | ICD-10-CM

## 2019-04-11 NOTE — Telephone Encounter (Signed)
pt called states she is ready to increase her zoloft and that you told her to call you when she felt like she was ready to increase.

## 2019-04-12 MED ORDER — SERTRALINE HCL 100 MG PO TABS: 150.0000 mg | ORAL_TABLET | Freq: Every day | ORAL | 1 refills | Status: DC

## 2019-04-12 NOTE — Telephone Encounter (Signed)
Spoke to pt - discussed increasing zoloft to 150 mg

## 2019-04-25 ENCOUNTER — Encounter: Payer: Self-pay | Admitting: Psychiatry

## 2019-04-25 ENCOUNTER — Other Ambulatory Visit: Payer: Self-pay

## 2019-04-25 ENCOUNTER — Ambulatory Visit (INDEPENDENT_AMBULATORY_CARE_PROVIDER_SITE_OTHER): Payer: BLUE CROSS/BLUE SHIELD | Admitting: Psychiatry

## 2019-04-25 DIAGNOSIS — F5105 Insomnia due to other mental disorder: Secondary | ICD-10-CM

## 2019-04-25 DIAGNOSIS — F330 Major depressive disorder, recurrent, mild: Secondary | ICD-10-CM

## 2019-04-25 DIAGNOSIS — F411 Generalized anxiety disorder: Secondary | ICD-10-CM

## 2019-04-25 DIAGNOSIS — F4010 Social phobia, unspecified: Secondary | ICD-10-CM

## 2019-04-25 NOTE — Progress Notes (Signed)
Virtual Visit via Video Note  I connected with Janice Gray on 04/25/19 at  4:30 PM EDT by a video enabled telemedicine application and verified that I am speaking with the correct person using two identifiers.   I discussed the limitations of evaluation and management by telemedicine and the availability of in person appointments. The patient expressed understanding and agreed to proceed.    I discussed the assessment and treatment plan with the patient. The patient was provided an opportunity to ask questions and all were answered. The patient agreed with the plan and demonstrated an understanding of the instructions.   The patient was advised to call back or seek an in-person evaluation if the symptoms worsen or if the condition fails to improve as anticipated.   BH MD/PA/NP OP Progress Note  04/25/2019 5:11 PM Janice Gray  MRN:  [id number]  Chief Complaint:  Chief Complaint    Follow-up     HPI: Janice Gray is a 38 year old Caucasian female, married, employed, lives in [place], has a history of depression, anxiety, was evaluated by telemedicine today.  Patient today reports that she is currently doing okay.  She is currently on Zoloft 150 mg since the past 1 week.  She reports that has not caused any side effects.  She is tolerating it well so far.  She reports she is getting used to the work situation.  Due to the COVID-19 there has been a lot of changes at work.  So far she is coping okay.  She reports she does struggle with her children being homeschooled.  Her daughter is not doing well.  Her stepson is also having trouble with his grades.  She has no help at home to take care of her children's needs when she is at work.  She reports she has to work to support the family.    Patient reports sleep is good.  She denies any suicidality, homicidality or perceptual disturbances.  She wants to get the Zoloft more time and does not want to make any medication changes today. Visit  Diagnosis:    ICD-10-CM   1. MDD (major depressive disorder), recurrent episode, mild (HCC) F33.0   2. Social anxiety disorder F40.10   3. GAD (generalized anxiety disorder) F41.1   4. Insomnia due to mental condition F51.05     Past Psychiatric History: Reviewed past psychiatric history from my progress note on 03/19/2018.  Past trials of medications like Abilify- light sensitivity, Seroquel-groggy, Wellbutrin-increased anxiety, Xanax  Past Medical History:  Past Medical History:  Diagnosis Date  . Anxiety   . Depression   . Headache    sinus  . History of kidney stones   . Microscopic hematuria   . Motion sickness    cars  . Wears contact lenses     Past Surgical History:  Procedure Laterality Date  . BREAST SURGERY     Implants  . ENDOSCOPIC CONCHA BULLOSA RESECTION Right 11/17/2016   Procedure: ENDOSCOPIC CONCHA BULLOSA RESECTION;  Surgeon: Vernie Murders, MD;  Location: [facility];  Service: ENT;  Laterality: Right;  . MANDIBLE FRACTURE SURGERY    . SEPTOPLASTY N/A 11/17/2016   Procedure: SEPTOPLASTY;  Surgeon: Vernie Murders, MD;  Location: [facility];  Service: ENT;  Laterality: N/A;  . TUBAL LIGATION Bilateral 10/26/2018  . TURBINATE REDUCTION Bilateral 11/17/2016   Procedure: PARTIAL INFERIOR TURBINATE REDUCTION;  Surgeon: Vernie Murders, MD;  Location: [facility];  Service: ENT;  Laterality: Bilateral;  . Wisdom Teeth Surgery  Family Psychiatric History: Reviewed family psychiatric history from my progress note on 03/19/2018.  Family History:  Family History  Problem Relation Age of Onset  . Kidney disease Neg Hx   . Bladder Cancer Neg Hx     Social History: Reviewed social history from my progress note on 03/19/2018 Social History   Socioeconomic History  . Marital status: Married    Spouse name: franklin  . Number of children: 2  . Years of education: Not on file  . Highest education level: Some college, no degree  Occupational  History    Comment: fulltime  Social Needs  . Financial resource strain: Somewhat hard  . Food insecurity:    Worry: Never true    Inability: Never true  . Transportation needs:    Medical: No    Non-medical: No  Tobacco Use  . Smoking status: Current Every Day Smoker    Packs/day: 0.50    Years: 15.00    Pack years: 7.50    Types: E-cigarettes, Cigarettes    Start date: 06/28/2000  . Smokeless tobacco: Never Used  Substance and Sexual Activity  . Alcohol use: Yes    Alcohol/week: 0.0 standard drinks    Comment: special occ./ social  . Drug use: No  . Sexual activity: Yes    Partners: Male    Birth control/protection: None, Surgical, Injection  Lifestyle  . Physical activity:    Days per week: 0 days    Minutes per session: 0 min  . Stress: Very much  Relationships  . Social connections:    Talks on phone: Three times a week    Gets together: Twice a week    Attends religious service: Never    Active member of club or organization: No    Attends meetings of clubs or organizations: Never    Relationship status: Married  Other Topics Concern  . Not on file  Social History Narrative  . Not on file    Allergies:  Allergies  Allergen Reactions  . Abilify [Aripiprazole]     LIGHT SENSITIVITY    Metabolic Disorder Labs: No results found for: HGBA1C, MPG No results found for: PROLACTIN Lab Results  Component Value Date   CHOL 150 05/25/2015   TRIG 75 05/25/2015   HDL 48 05/25/2015   LDLCALC 87 05/25/2015   Lab Results  Component Value Date   TSH 1.866 03/30/2015    Therapeutic Level Labs: No results found for: LITHIUM No results found for: VALPROATE No components found for:  CBMZ  Current Medications: Current Outpatient Medications  Medication Sig Dispense Refill  . cetirizine (ZYRTEC) 10 MG tablet Take by mouth.    . doxepin (SINEQUAN) 10 MG capsule Take 1-2 capsules (10-20 mg total) by mouth at bedtime as needed. For sleep (Patient not taking:  Reported on 12/24/2018) 90 capsule 1  . fluticasone (FLONASE) 50 MCG/ACT nasal spray Place 2 sprays into both nostrils daily.    . hydrOXYzine (VISTARIL) 25 MG capsule Take 1-2 capsules (25-50 mg total) by mouth 3 times/day as needed-between meals & bedtime for anxiety (sleep). 180 capsule 1  . ibuprofen (ADVIL,MOTRIN) 600 MG tablet Take 1 tablet (600 mg total) by mouth every 8 (eight) hours as needed. 15 tablet 0  . lamoTRIgine (LAMICTAL) 25 MG tablet TAKE 1 TABLET BY MOUTH  DAILY WITH SUPPER 90 tablet 1  . Multiple Vitamins-Minerals (MULTIVITAMIN GUMMIES ADULT) CHEW Chew by mouth.    . naproxen sodium (ALEVE) 220 MG tablet Take by mouth.    .Marland Kitchen  sertraline (ZOLOFT) 100 MG tablet Take 1.5 tablets (150 mg total) by mouth daily. 135 tablet 1   No current facility-administered medications for this visit.      Musculoskeletal: Strength & Muscle Tone: within normal limits Gait & Station: normal Patient leans: N/A  Psychiatric Specialty Exam: Review of Systems  Psychiatric/Behavioral: The patient is nervous/anxious.   All other systems reviewed and are negative.   There were no vitals taken for this visit.There is no height or weight on file to calculate BMI.  General Appearance: Casual  Eye Contact:  Fair  Speech:  Clear and Coherent  Volume:  Normal  Mood:  Anxious  Affect:  Appropriate  Thought Process:  Goal Directed and Descriptions of Associations: Intact  Orientation:  Full (Time, Place, and Person)  Thought Content: Logical   Suicidal Thoughts:  No  Homicidal Thoughts:  No  Memory:  Immediate;   Fair Recent;   Fair Remote;   Fair  Judgement:  Fair  Insight:  Fair  Psychomotor Activity:  Normal  Concentration:  Concentration: Fair and Attention Span: Fair  Recall:  [employer] of Knowledge: Fair  Language: Fair  Akathisia:  No  Handed:  Right  AIMS (if indicated): Denies tremors, rigidity, stiffness  Assets:  Communication Skills Desire for Improvement Housing Social  Support  ADL's:  Intact  Cognition: WNL  Sleep:  Fair   Screenings:   Assessment and Plan: Janice Gray is a 38 year old Caucasian female who has a history of depression, anxiety, was evaluated by telemedicine today.  Patient is currently on a higher dosage of Zoloft and tolerating it well.  She does have psychosocial stressors of the current COVID-19 outbreak, children being homeschooled and so on.  We will continue plan as noted below.  Plan MDD- some progress Continue Zoloft 150 mg p.o. daily Lamictal 25 mg p.o. daily Hydroxyzine 25 to 50 mg p.o. 3 times daily as needed  For GAD- unstable Zoloft as prescribed.  Her dosage was recently increased and will give it more time.  For social anxiety disorder-improving We will continue to monitor closely.  Insomnia-stable She rarely uses doxepin.  Hydroxyzine as needed is also available.  Follow-up in clinic in 6 weeks or sooner if needed.  Appointment scheduled for June 18 at 4 PM  I have spent atleast 15 minutes non face to face with patient today. More than 50 % of the time was spent for psychoeducation and supportive psychotherapy and care coordination.  This note was generated in part or whole with voice recognition software. Voice recognition is usually quite accurate but there are transcription errors that can and very often do occur. I apologize for any typographical errors that were not detected and corrected.        [name], MD 04/25/2019, 5:11 PM

## 2019-06-06 ENCOUNTER — Other Ambulatory Visit: Payer: Self-pay

## 2019-06-06 ENCOUNTER — Encounter: Payer: Self-pay | Admitting: Psychiatry

## 2019-06-06 ENCOUNTER — Ambulatory Visit (INDEPENDENT_AMBULATORY_CARE_PROVIDER_SITE_OTHER): Payer: BC Managed Care – PPO | Admitting: Psychiatry

## 2019-06-06 DIAGNOSIS — F411 Generalized anxiety disorder: Secondary | ICD-10-CM | POA: Diagnosis not present

## 2019-06-06 DIAGNOSIS — F4010 Social phobia, unspecified: Secondary | ICD-10-CM

## 2019-06-06 DIAGNOSIS — F331 Major depressive disorder, recurrent, moderate: Secondary | ICD-10-CM

## 2019-06-06 DIAGNOSIS — F5105 Insomnia due to other mental disorder: Secondary | ICD-10-CM

## 2019-06-06 MED ORDER — SERTRALINE HCL 100 MG PO TABS: 200.0000 mg | ORAL_TABLET | Freq: Every day | ORAL | 1 refills | Status: DC

## 2019-06-06 MED ORDER — LAMOTRIGINE 25 MG PO TABS: ORAL_TABLET | ORAL | 0 refills | Status: DC

## 2019-06-06 NOTE — Progress Notes (Signed)
Virtual Visit via Video Note  I connected with Janice Gray on 06/06/19 at  4:00 PM EDT by a video enabled telemedicine application and verified that I am speaking with the correct person using two identifiers.   I discussed the limitations of evaluation and management by telemedicine and the availability of in person appointments. The patient expressed understanding and agreed to proceed.     I discussed the assessment and treatment plan with the patient. The patient was provided an opportunity to ask questions and all were answered. The patient agreed with the plan and demonstrated an understanding of the instructions.   The patient was advised to call back or seek an in-person evaluation if the symptoms worsen or if the condition fails to improve as anticipated.   [facility] MD OP Progress Note  06/06/2019 5:41 PM Janice Gray  MRN:  [id number]  Chief Complaint:  Chief Complaint    Follow-up     HPI: Janice Gray is a 38 year old Caucasian female, married, employed, lives in [place], has a history of MDD, social anxiety disorder, GAD was evaluated by telemedicine today.  Patient today reports she has noticed some improvement in her mood symptoms with the Zoloft.  She however continues to struggle with some sadness, nervousness, lack of motivation and inability to focus and so on.  She denies any side effects to the Zoloft at this time.  She is tolerating it and reports she is compliant on it.  She reports the Lamictal may have helped with her mood lability.  She did have episodes of possible high energy and agitation which the Lamictal has helped with.  She currently denies having it.  Patient reports sleep is good.  She continues to struggle with her children however has support system from her family.  She reports work is going well.  Patient denies any suicidality, homicidality or perceptual disturbances.  She denies any other concerns today.   Visit Diagnosis:    ICD-10-CM   1.  Major depressive disorder, recurrent episode, moderate (HCC)  F33.1 lamoTRIgine (LAMICTAL) 25 MG tablet  2. Social anxiety disorder  F40.10 sertraline (ZOLOFT) 100 MG tablet  3. GAD (generalized anxiety disorder)  F41.1 sertraline (ZOLOFT) 100 MG tablet  4. Insomnia due to mental condition  F51.05     Past Psychiatric History: Reviewed past psychiatric history from my progress note on 03/19/2018.  Past trials of medications like Abilify-light sensitivity, Seroquel-groggy, Wellbutrin-increased anxiety, Xanax.  Past Medical History:  Past Medical History:  Diagnosis Date  . Anxiety   . Depression   . Headache    sinus  . History of kidney stones   . Microscopic hematuria   . Motion sickness    cars  . Wears contact lenses     Past Surgical History:  Procedure Laterality Date  . BREAST SURGERY     Implants  . ENDOSCOPIC CONCHA BULLOSA RESECTION Right 11/17/2016   Procedure: ENDOSCOPIC CONCHA BULLOSA RESECTION;  Surgeon: Margaretha Sheffield, MD;  Location: [facility];  Service: ENT;  Laterality: Right;  . MANDIBLE FRACTURE SURGERY    . SEPTOPLASTY N/A 11/17/2016   Procedure: SEPTOPLASTY;  Surgeon: Margaretha Sheffield, MD;  Location: [facility];  Service: ENT;  Laterality: N/A;  . TUBAL LIGATION Bilateral 10/26/2018  . TURBINATE REDUCTION Bilateral 11/17/2016   Procedure: PARTIAL INFERIOR TURBINATE REDUCTION;  Surgeon: Margaretha Sheffield, MD;  Location: [facility];  Service: ENT;  Laterality: Bilateral;  . Wisdom Teeth Surgery      Family Psychiatric  History: I have reviewed family psychiatric history from my progress note on 03/19/2018.  Family History:  Family History  Problem Relation Age of Onset  . Kidney disease Neg Hx   . Bladder Cancer Neg Hx     Social History: Reviewed social history from my progress note on 03/19/2018 Social History   Socioeconomic History  . Marital status: Married    Spouse name: franklin  . Number of children: 2  . Years of education: Not  on file  . Highest education level: Some college, no degree  Occupational History    Comment: fulltime  Social Needs  . Financial resource strain: Somewhat hard  . Food insecurity    Worry: Never true    Inability: Never true  . Transportation needs    Medical: No    Non-medical: No  Tobacco Use  . Smoking status: Current Every Day Smoker    Packs/day: 0.50    Years: 15.00    Pack years: 7.50    Types: E-cigarettes, Cigarettes    Start date: 06/28/2000  . Smokeless tobacco: Never Used  Substance and Sexual Activity  . Alcohol use: Yes    Alcohol/week: 0.0 standard drinks    Comment: special occ./ social  . Drug use: No  . Sexual activity: Yes    Partners: Male    Birth control/protection: None, Surgical, Injection  Lifestyle  . Physical activity    Days per week: 0 days    Minutes per session: 0 min  . Stress: Very much  Relationships  . Social Musicianconnections    Talks on phone: Three times a week    Gets together: Twice a week    Attends religious service: Never    Active member of club or organization: No    Attends meetings of clubs or organizations: Never    Relationship status: Married  Other Topics Concern  . Not on file  Social History Narrative  . Not on file    Allergies:  Allergies  Allergen Reactions  . Abilify [Aripiprazole]     LIGHT SENSITIVITY    Metabolic Disorder Labs: No results found for: HGBA1C, MPG No results found for: PROLACTIN Lab Results  Component Value Date   CHOL 150 05/25/2015   TRIG 75 05/25/2015   HDL 48 05/25/2015   LDLCALC 87 05/25/2015   Lab Results  Component Value Date   TSH 1.866 03/30/2015    Therapeutic Level Labs: No results found for: LITHIUM No results found for: VALPROATE No components found for:  CBMZ  Current Medications: Current Outpatient Medications  Medication Sig Dispense Refill  . cetirizine (ZYRTEC) 10 MG tablet Take by mouth.    . doxepin (SINEQUAN) 10 MG capsule Take 1-2 capsules (10-20 mg  total) by mouth at bedtime as needed. For sleep (Patient not taking: Reported on 12/24/2018) 90 capsule 1  . fluticasone (FLONASE) 50 MCG/ACT nasal spray Place 2 sprays into both nostrils daily.    . hydrOXYzine (VISTARIL) 25 MG capsule Take 1-2 capsules (25-50 mg total) by mouth 3 times/day as needed-between meals & bedtime for anxiety (sleep). 180 capsule 1  . ibuprofen (ADVIL,MOTRIN) 600 MG tablet Take 1 tablet (600 mg total) by mouth every 8 (eight) hours as needed. 15 tablet 0  . lamoTRIgine (LAMICTAL) 25 MG tablet TAKE 1 TABLET BY MOUTH  DAILY WITH SUPPER 90 tablet 0  . Multiple Vitamins-Minerals (MULTIVITAMIN GUMMIES ADULT) CHEW Chew by mouth.    . naproxen sodium (ALEVE) 220 MG tablet Take by mouth.    .Marland Kitchen  sertraline (ZOLOFT) 100 MG tablet Take 2 tablets (200 mg total) by mouth daily. 180 tablet 1   No current facility-administered medications for this visit.      Musculoskeletal: Strength & Muscle Tone: within normal limits Gait & Station: normal Patient leans: N/A  Psychiatric Specialty Exam: Review of Systems  Psychiatric/Behavioral: Positive for depression.  All other systems reviewed and are negative.   There were no vitals taken for this visit.There is no height or weight on file to calculate BMI.  General Appearance: Casual  Eye Contact:  Fair  Speech:  Clear and Coherent  Volume:  Normal  Mood:  Depressed  Affect:  Congruent  Thought Process:  Goal Directed and Descriptions of Associations: Intact  Orientation:  Full (Time, Place, and Person)  Thought Content: Logical   Suicidal Thoughts:  No  Homicidal Thoughts:  No  Memory:  Immediate;   Fair Recent;   Fair Remote;   Fair  Judgement:  Fair  Insight:  Fair  Psychomotor Activity:  Normal  Concentration:  Concentration: Fair and Attention Span: Fair  Recall:  FiservFair  Fund of Knowledge: Fair  Language: Fair  Akathisia:  No  Handed:  Right  AIMS (if indicated): denies tremors, rigidity  Assets:  Communication  Skills Desire for Improvement Social Support  ADL's:  Intact  Cognition: WNL  Sleep:  Fair   Screenings:   Assessment and Plan: Janice QuinLiana is a 38 year old Caucasian female, has a history of depression, anxiety was evaluated by telemedicine today.  Patient continues to struggle with some depression and anxiety however currently making progress.  Patient however would benefit from dosage readjustment.  Plan as noted below.  Plan MDD-improving Increase Zoloft to 200 mg p.o. daily Lamictal 25 mg p.o. daily Hydroxyzine 25 to 50 mg p.o. 3 times daily as needed  For GAD- improving Zoloft increased as summarized above  For social anxiety disorder-improving We will continue to monitor closely.  Insomnia-stable She rarely uses doxepin.  Hydroxyzine as needed is also available.  Follow-up in clinic in 1 month or sooner if needed.  Appointment scheduled for August 12 at 4:15 PM  I have spent atleast 15 minutes non face to face with patient today. More than 50 % of the time was spent for psychoeducation and supportive psychotherapy and care coordination.  This note was generated in part or whole with voice recognition software. Voice recognition is usually quite accurate but there are transcription errors that can and very often do occur. I apologize for any typographical errors that were not detected and corrected.         Jomarie LongsSaramma Nithin Demeo, MD 06/06/2019, 5:41 PM

## 2019-07-29 ENCOUNTER — Ambulatory Visit
Admission: EM | Admit: 2019-07-29 | Discharge: 2019-07-29 | Disposition: A | Discharge status: Home / Self Care | Payer: BC Managed Care – PPO | Attending: Family Medicine | Admitting: Family Medicine

## 2019-07-29 ENCOUNTER — Other Ambulatory Visit: Payer: Self-pay

## 2019-07-29 ENCOUNTER — Ambulatory Visit (INDEPENDENT_AMBULATORY_CARE_PROVIDER_SITE_OTHER): Payer: BC Managed Care – PPO

## 2019-07-29 DIAGNOSIS — R05 Cough: Secondary | ICD-10-CM

## 2019-07-29 DIAGNOSIS — R062 Wheezing: Secondary | ICD-10-CM

## 2019-07-29 DIAGNOSIS — F17210 Nicotine dependence, cigarettes, uncomplicated: Secondary | ICD-10-CM

## 2019-07-29 DIAGNOSIS — R059 Cough, unspecified: Secondary | ICD-10-CM

## 2019-07-29 MED ORDER — BENZONATATE 200 MG PO CAPS: 200.0000 mg | ORAL_CAPSULE | Freq: Three times a day (TID) | ORAL | 0 refills | Status: DC | PRN

## 2019-07-29 MED ORDER — ALBUTEROL SULFATE HFA 108 (90 BASE) MCG/ACT IN AERS: 1.0000 | INHALATION_SPRAY | Freq: Four times a day (QID) | RESPIRATORY_TRACT | 0 refills | Status: DC | PRN

## 2019-07-29 NOTE — ED Triage Notes (Signed)
Patient states that she has been coughing for 1 week now. Patient reports that she was swabbed for Covid on 07/23/2019 and was negative. Patient states that she has been in general has not felt well, no fevers.

## 2019-07-29 NOTE — ED Provider Notes (Signed)
[facility]    CSN: [id number] Arrival date & time: 07/29/19  0946     History   Chief Complaint Chief Complaint  Patient presents with   Cough    HPI Janice Gray is a 38 y.o. female.   38 yo female with a c/o cough for 7-10 days, occasionally productive as well as intermittent wheezing. Denies any fevers, chills, chest pains, shortness of breath. Does c/o fatigue. Patient is a smoker. Patient tested negative for covid last week.     Past Medical History:  Diagnosis Date   Anxiety    Depression    Headache    sinus   History of kidney stones    Microscopic hematuria    Motion sickness    cars   Wears contact lenses     Patient Active Problem List   Diagnosis Date Noted   GAD (generalized anxiety disorder) 06/06/2019   Insomnia due to mental condition 06/06/2019   Chronic seasonal allergic rhinitis due to pollen 08/02/2016   Major depressive disorder, recurrent episode, moderate (HCC) 03/30/2016   Social anxiety disorder 03/30/2016   Irritable bowel syndrome with constipation 11/24/2015   Calculus of kidney 11/24/2015   Anxiety 11/04/2015   Cigarette smoker 11/04/2015   Ureteral stone 08/30/2015   Hydronephrosis with urinary obstruction due to ureteral calculus 08/30/2015   Microscopic hematuria 08/30/2015   History of nephrolithiasis 08/30/2015   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/28/2015   Depression, major, recurrent, moderate (HCC) 08/28/2015   Phobia, 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   Clinical depression 06/29/2015    Past Surgical History:  Procedure Laterality Date   BREAST SURGERY     Implants   ENDOSCOPIC CONCHA BULLOSA RESECTION Right 11/17/2016   Procedure: ENDOSCOPIC CONCHA BULLOSA RESECTION;  Surgeon: Vernie MurdersPaul Juengel, MD;  [facility];  Service: ENT;  Laterality: Right;   MANDIBLE FRACTURE SURGERY     SEPTOPLASTY N/A 11/17/2016   Procedure: SEPTOPLASTY;  Surgeon: Vernie MurdersPaul Juengel, MD;  [facility];  Service: ENT;  Laterality: N/A;   TUBAL LIGATION Bilateral 10/26/2018   TURBINATE REDUCTION Bilateral 11/17/2016   Procedure: PARTIAL INFERIOR TURBINATE REDUCTION;  Surgeon: Vernie MurdersPaul Juengel, MD;  [facility];  Service: ENT;  Laterality: Bilateral;   Wisdom Teeth Surgery      OB History   No obstetric history on file.      Home Medications    Prior to Admission medications   Medication Sig Start Date End Date Taking? Authorizing Provider  cetirizine (ZYRTEC) 10 MG tablet Take by mouth.   Yes [provider]  fluticasone (FLONASE) 50 MCG/ACT nasal spray Place 2 sprays into both nostrils daily.   Yes [provider]  hydrOXYzine (VISTARIL) 25 MG capsule Take 1-2 capsules (25-50 mg total) by mouth 3 times/day as needed-between meals & bedtime for anxiety (sleep). 05/21/18  Yes Jomarie LongsEappen, Saramma, MD  ibuprofen (ADVIL,MOTRIN) 600 MG tablet Take 1 tablet (600 mg total) by mouth every 8 (eight) hours as needed. 08/20/15  Yes Irean HongSung, Jade J, MD  lamoTRIgine (LAMICTAL) 25 MG tablet TAKE 1 TABLET BY MOUTH  DAILY WITH SUPPER 06/06/19  Yes Jomarie LongsEappen, Saramma, MD  Multiple Vitamins-Minerals (MULTIVITAMIN GUMMIES ADULT) CHEW Chew by mouth.   Yes [provider]  naproxen sodium (ALEVE) 220 MG tablet Take by mouth.   Yes [provider]  sertraline (ZOLOFT) 100 MG tablet Take 2 tablets (200 mg total) by mouth daily. 06/06/19  Yes Jomarie LongsEappen, Saramma, MD  albuterol (VENTOLIN HFA) 108 (90 Base) MCG/ACT inhaler  Inhale 1-2 puffs into the lungs every 6 (six) hours as needed for wheezing (cough). 07/29/19   Norval Gable, MD  benzonatate (TESSALON) 200 MG capsule Take 1 capsule (200 mg total) by mouth 3 (three) times daily as needed. 07/29/19   Norval Gable, MD  doxepin (SINEQUAN) 10 MG capsule Take 1-2 capsules (10-20 mg total) by mouth at bedtime as needed. For sleep Patient not taking: Reported on 12/24/2018 05/21/18   Ursula Alert, MD    Family History Family  History  Problem Relation Age of Onset   Kidney disease Neg Hx    Bladder Cancer Neg Hx     Social History Social History   Tobacco Use   Smoking status: Current Every Day Smoker    Packs/day: 0.50    Years: 15.00    Pack years: 7.50    Types: E-cigarettes, Cigarettes    Start date: 06/28/2000   Smokeless tobacco: Never Used  Substance Use Topics   Alcohol use: Yes    Alcohol/week: 0.0 standard drinks    Comment: special occ./ social   Drug use: No     Allergies   Abilify [aripiprazole]   Review of Systems Review of Systems   Physical Exam Triage Vital Signs ED Triage Vitals  Enc Vitals Group     BP 07/29/19 1019 108/72     Pulse Rate 07/29/19 1019 84     Resp 07/29/19 1019 17     Temp 07/29/19 1019 98.1 F (36.7 C)     Temp Source 07/29/19 1019 Oral     SpO2 07/29/19 1019 98 %     Weight 07/29/19 1017 160 lb (72.6 kg)     Height 07/29/19 1017 5\' 6"  (1.676 m)     Head Circumference --      Peak Flow --      Pain Score 07/29/19 1017 0     Pain Loc --      Pain Edu? --      Excl. in [place]? --    No data found.  Updated Vital Signs BP 108/72 (BP Location: Right Arm)    Pulse 84    Temp 98.1 F (36.7 C) (Oral)    Resp 17    Ht 5\' 6"  (1.676 m)    Wt 72.6 kg    LMP 07/15/2019    SpO2 98%    BMI 25.82 kg/m   Visual Acuity Right Eye Distance:   Left Eye Distance:   Bilateral Distance:    Right Eye Near:   Left Eye Near:    Bilateral Near:     Physical Exam Vitals signs and nursing note reviewed.  Constitutional:      General: She is not in acute distress.    Appearance: She is not ill-appearing, toxic-appearing or diaphoretic.  Cardiovascular:     Rate and Rhythm: Normal rate and regular rhythm.  Pulmonary:     Effort: Pulmonary effort is normal. No respiratory distress.     Breath sounds: No stridor. Wheezing (mild; diffuse) present. No rhonchi or rales.  Neurological:     Mental Status: She is alert.      UC Treatments / Results   Labs (all labs ordered are listed, but only abnormal results are displayed) Labs Reviewed - No data to display  EKG   Radiology Dg Chest 2 View  Result Date: 07/29/2019 CLINICAL DATA:  Cough EXAM: CHEST - 2 VIEW COMPARISON:  None. FINDINGS: Normal heart size and mediastinal contours. No acute infiltrate or edema.  No effusion or pneumothorax. No acute osseous findings. IMPRESSION: Negative chest. Electronically Signed   By: Marnee SpringJonathon  Watts M.D.   On: 07/29/2019 10:59    Procedures Procedures (including critical care time)  Medications Ordered in UC Medications - No data to display  Initial Impression / Assessment and Plan / UC Course  I have reviewed the triage vital signs and the nursing notes.  Pertinent labs & imaging results that were available during my care of the patient were reviewed by me and considered in my medical decision making (see chart for details).      Final Clinical Impressions(s) / UC Diagnoses   Final diagnoses:  Cough    ED Prescriptions    Medication Sig Dispense Auth. Provider   benzonatate (TESSALON) 200 MG capsule Take 1 capsule (200 mg total) by mouth 3 (three) times daily as needed. 30 capsule Payton Mccallumonty, Yoshiharu Brassell, MD   albuterol (VENTOLIN HFA) 108 (90 Base) MCG/ACT inhaler Inhale 1-2 puffs into the lungs every 6 (six) hours as needed for wheezing (cough). 8 g Payton Mccallumonty, Jariah Jarmon, MD      1. x-ray result (negative chest x-ray)  and diagnosis reviewed with patient 2. rx as per orders above; reviewed possible side effects, interactions, risks and benefits  3. Recommend supportive treatment with rest,fluids, smoking cessation 4. Follow-up prn if symptoms worsen or don't improve   Controlled Substance Prescriptions [place] Controlled Substance Registry consulted? Not Applicable   Payton Mccallumonty, Eyla Tallon, MD 07/29/19 1154

## 2019-07-31 ENCOUNTER — Ambulatory Visit (INDEPENDENT_AMBULATORY_CARE_PROVIDER_SITE_OTHER): Payer: BC Managed Care – PPO | Admitting: Psychiatry

## 2019-07-31 ENCOUNTER — Other Ambulatory Visit: Payer: Self-pay | Admitting: Psychiatry

## 2019-07-31 ENCOUNTER — Encounter: Payer: Self-pay | Admitting: Psychiatry

## 2019-07-31 ENCOUNTER — Other Ambulatory Visit: Payer: Self-pay

## 2019-07-31 DIAGNOSIS — F411 Generalized anxiety disorder: Secondary | ICD-10-CM | POA: Diagnosis not present

## 2019-07-31 DIAGNOSIS — F5105 Insomnia due to other mental disorder: Secondary | ICD-10-CM | POA: Diagnosis not present

## 2019-07-31 DIAGNOSIS — F4010 Social phobia, unspecified: Secondary | ICD-10-CM | POA: Diagnosis not present

## 2019-07-31 DIAGNOSIS — F331 Major depressive disorder, recurrent, moderate: Secondary | ICD-10-CM

## 2019-07-31 MED ORDER — LAMOTRIGINE 25 MG PO TABS: 50.0000 mg | ORAL_TABLET | Freq: Every day | ORAL | 0 refills | Status: DC

## 2019-07-31 NOTE — Progress Notes (Signed)
Virtual Visit via Video Note  I connected with Janice Gray on 07/31/19 at  4:15 PM EDT by a video enabled telemedicine application and verified that I am speaking with the correct person using two identifiers.   I discussed the limitations of evaluation and management by telemedicine and the availability of in person appointments. The patient expressed understanding and agreed to proceed.   I discussed the assessment and treatment plan with the patient. The patient was provided an opportunity to ask questions and all were answered. The patient agreed with the plan and demonstrated an understanding of the instructions.   The patient was advised to call back or seek an in-person evaluation if the symptoms worsen or if the condition fails to improve as anticipated.   BH MD OP Progress Note  07/31/2019 4:53 PM Janice Gray  MRN:  [id number]  Chief Complaint:  Chief Complaint    Follow-up     HPI: Janice Gray is a 38 year old Caucasian female, married, employed, lives in [place], has a history of MDD, social anxiety disorder, GAD was evaluated by telemedicine today.  Patient today reports she is currently struggling with some cough and other respiratory tract infection symptoms.  She reports she got tested for COVID-19 however the test came back negative.  She was out sick for a week and a half.  She is currently on medications like Tessalon to manage her cough.  She reports she is feeling a little bit better with regards to that.  She reports she continues to struggle with some mood lability.  She continues to be compliant with Lamictal and Zoloft as prescribed.  She wonders whether her Lamictal can be readjusted today.  She reports sleep is good.  She uses hydroxyzine and does not take doxepin at all.  She reports her daughter currently works at the place where she works and that is going well.  She denies any suicidality, homicidality or perceptual disturbances.  She denies any other  concerns today.   Visit Diagnosis:    ICD-10-CM   1. Major depressive disorder, recurrent episode, moderate (HCC)  F33.1 lamoTRIgine (LAMICTAL) 25 MG tablet  2. Social anxiety disorder  F40.10 lamoTRIgine (LAMICTAL) 25 MG tablet  3. GAD (generalized anxiety disorder)  F41.1 lamoTRIgine (LAMICTAL) 25 MG tablet  4. Insomnia due to mental condition  F51.05     Past Psychiatric History: I have reviewed past psychiatric history from my progress note on 03/19/2018.  Past trials of medications like Abilify-light sensitivity, Seroquel-groggy, Wellbutrin-increased anxiety, Xanax.  Past Medical History:  Past Medical History:  Diagnosis Date  . Anxiety   . Depression   . Headache    sinus  . History of kidney stones   . Microscopic hematuria   . Motion sickness    cars  . Wears contact lenses     Past Surgical History:  Procedure Laterality Date  . BREAST SURGERY     Implants  . ENDOSCOPIC CONCHA BULLOSA RESECTION Right 11/17/2016   Procedure: ENDOSCOPIC CONCHA BULLOSA RESECTION;  Surgeon: Vernie MurdersPaul Juengel, MD;  [facility];  Service: ENT;  Laterality: Right;  . MANDIBLE FRACTURE SURGERY    . SEPTOPLASTY N/A 11/17/2016   Procedure: SEPTOPLASTY;  Surgeon: Vernie MurdersPaul Juengel, MD;  [facility];  Service: ENT;  Laterality: N/A;  . TUBAL LIGATION Bilateral 10/26/2018  . TURBINATE REDUCTION Bilateral 11/17/2016   Procedure: PARTIAL INFERIOR TURBINATE REDUCTION;  Surgeon: Vernie MurdersPaul Juengel, MD;  [facility];  Service: ENT;  Laterality: Bilateral;  .  Wisdom Teeth Surgery      Family Psychiatric History: I have reviewed family psychiatric history from my progress note on 04/07/2018.  Family History:  Family History  Problem Relation Age of Onset  . Kidney disease Neg Hx   . Bladder Cancer Neg Hx     Social History: I have reviewed social history from my progress note on 03/19/2018. Social History   Socioeconomic History  . Marital status: Married     Spouse name: franklin  . Number of children: 2  . Years of education: Not on file  . Highest education level: Some college, no degree  Occupational History    Comment: fulltime  Social Needs  . Financial resource strain: Somewhat hard  . Food insecurity    Worry: Never true    Inability: Never true  . Transportation needs    Medical: No    Non-medical: No  Tobacco Use  . Smoking status: Current Every Day Smoker    Packs/day: 0.50    Years: 15.00    Pack years: 7.50    Types: E-cigarettes, Cigarettes    Start date: 06/28/2000  . Smokeless tobacco: Never Used  Substance and Sexual Activity  . Alcohol use: Yes    Alcohol/week: 0.0 standard drinks    Comment: special occ./ social  . Drug use: No  . Sexual activity: Yes    Partners: Male    Birth control/protection: None, Surgical, Injection  Lifestyle  . Physical activity    Days per week: 0 days    Minutes per session: 0 min  . Stress: Very much  Relationships  . Social Herbalist on phone: Three times a week    Gets together: Twice a week    Attends religious service: Never    Active member of club or organization: No    Attends meetings of clubs or organizations: Never    Relationship status: Married  Other Topics Concern  . Not on file  Social History Narrative  . Not on file    Allergies:  Allergies  Allergen Reactions  . Abilify [Aripiprazole]     LIGHT SENSITIVITY    Metabolic Disorder Labs: No results found for: HGBA1C, MPG No results found for: PROLACTIN Lab Results  Component Value Date   CHOL 150 05/25/2015   TRIG 75 05/25/2015   HDL 48 05/25/2015   LDLCALC 87 05/25/2015   Lab Results  Component Value Date   TSH 1.866 03/30/2015    Therapeutic Level Labs: No results found for: LITHIUM No results found for: VALPROATE No components found for:  CBMZ  Current Medications: Current Outpatient Medications  Medication Sig Dispense Refill  . etodolac (LODINE) 500 MG tablet Take  by mouth.    Marland Kitchen albuterol (VENTOLIN HFA) 108 (90 Base) MCG/ACT inhaler Inhale 1-2 puffs into the lungs every 6 (six) hours as needed for wheezing (cough). 8 g 0  . benzonatate (TESSALON) 200 MG capsule Take 1 capsule (200 mg total) by mouth 3 (three) times daily as needed. 30 capsule 0  . cetirizine (ZYRTEC) 10 MG tablet Take by mouth.    . doxepin (SINEQUAN) 10 MG capsule Take 1-2 capsules (10-20 mg total) by mouth at bedtime as needed. For sleep (Patient not taking: Reported on 12/24/2018) 90 capsule 1  . fluticasone (FLONASE) 50 MCG/ACT nasal spray Place 2 sprays into both nostrils daily.    . hydrOXYzine (VISTARIL) 25 MG capsule Take 1-2 capsules (25-50 mg total) by mouth 3 times/day as needed-between meals &  bedtime for anxiety (sleep). 180 capsule 1  . ibuprofen (ADVIL,MOTRIN) 600 MG tablet Take 1 tablet (600 mg total) by mouth every 8 (eight) hours as needed. 15 tablet 0  . lamoTRIgine (LAMICTAL) 25 MG tablet Take 2 tablets (50 mg total) by mouth daily. 180 tablet 0  . Multiple Vitamins-Minerals (MULTIVITAMIN GUMMIES ADULT) CHEW Chew by mouth.    . naproxen sodium (ALEVE) 220 MG tablet Take by mouth.    Marland Kitchen. PREVIDENT 5000 DRY MOUTH 1.1 % GEL dental gel     . sertraline (ZOLOFT) 100 MG tablet Take 2 tablets (200 mg total) by mouth daily. 180 tablet 1   No current facility-administered medications for this visit.      Musculoskeletal: Strength & Muscle Tone: UTA Gait & Station: normal Patient leans: N/A  Psychiatric Specialty Exam: Review of Systems  Respiratory: Positive for cough.   Psychiatric/Behavioral: The patient is nervous/anxious.   All other systems reviewed and are negative.   Last menstrual period 07/15/2019.There is no height or weight on file to calculate BMI.  General Appearance: Casual  Eye Contact:  Good  Speech:  Clear and Coherent  Volume:  Normal  Mood:  Anxious  Affect:  Congruent  Thought Process:  Goal Directed and Descriptions of Associations: Intact   Orientation:  Full (Time, Place, and Person)  Thought Content: Logical   Suicidal Thoughts:  No  Homicidal Thoughts:  No  Memory:  Immediate;   Fair Recent;   Fair Remote;   Fair  Judgement:  Fair  Insight:  Fair  Psychomotor Activity:  Normal  Concentration:  Concentration: Fair and Attention Span: Fair  Recall:  [employer]Fair  Fund of Knowledge: Fair  Language: Fair  Akathisia:  No  Handed:  Right  AIMS (if indicated):Denies tremors, rigidity  Assets:  Communication Skills Desire for Improvement Housing Social Support  ADL's:  Intact  Cognition: WNL  Sleep:  Fair   Screenings:   Assessment and Plan: Janice Gray is a 38 year old Caucasian female, has a history of depression, anxiety was evaluated by telemedicine today.  Patient continues to struggle with some mood lability.  Patient is currently recovering from a respiratory tract infection which could also be contributing to some of her symptoms.  Discussed plan as noted below.  Plan For MDD-some progress Zoloft 200 mg p.o. daily Increase Lamictal to 50 mg p.o. daily. Discussed to start higher dosage once she is completely off of her current medications for cough. Hydroxyzine 25 to 50 mg 3 times daily as needed  For GAD-improving Zoloft prescribed.  For social anxiety disorder We will continue to monitor closely.  Insomnia-stable She rarely uses doxepin.  Hydroxyzine as needed is also available.  Follow-up in clinic in 1 to 2 months or sooner if needed.Oct 6 th , 4 pm  I have spent atleast 15 minutes non face to face with patient today. More than 50 % of the time was spent for psychoeducation and supportive psychotherapy and care coordination.  This note was generated in part or whole with voice recognition software. Voice recognition is usually quite accurate but there are transcription errors that can and very often do occur. I apologize for any typographical errors that were not detected and  corrected.          Jomarie LongsSaramma Camiya Vinal, MD 07/31/2019, 4:53 PM

## 2019-08-07 ENCOUNTER — Telehealth: Payer: Self-pay | Admitting: Psychiatry

## 2019-08-07 DIAGNOSIS — F4010 Social phobia, unspecified: Secondary | ICD-10-CM

## 2019-08-07 DIAGNOSIS — F411 Generalized anxiety disorder: Secondary | ICD-10-CM

## 2019-08-07 DIAGNOSIS — F331 Major depressive disorder, recurrent, moderate: Secondary | ICD-10-CM

## 2019-08-07 MED ORDER — LAMOTRIGINE 25 MG PO TABS: 50.0000 mg | ORAL_TABLET | Freq: Every day | ORAL | 0 refills | Status: DC

## 2019-08-07 NOTE — Telephone Encounter (Signed)
Sent lamictal with dose clarification to [facility].

## 2019-09-24 ENCOUNTER — Other Ambulatory Visit: Payer: Self-pay

## 2019-09-24 ENCOUNTER — Ambulatory Visit (INDEPENDENT_AMBULATORY_CARE_PROVIDER_SITE_OTHER): Payer: BC Managed Care – PPO | Admitting: Psychiatry

## 2019-09-24 ENCOUNTER — Encounter: Payer: Self-pay | Admitting: Psychiatry

## 2019-09-24 DIAGNOSIS — F331 Major depressive disorder, recurrent, moderate: Secondary | ICD-10-CM

## 2019-09-24 DIAGNOSIS — F411 Generalized anxiety disorder: Secondary | ICD-10-CM

## 2019-09-24 DIAGNOSIS — F5105 Insomnia due to other mental disorder: Secondary | ICD-10-CM

## 2019-09-24 DIAGNOSIS — F4010 Social phobia, unspecified: Secondary | ICD-10-CM | POA: Diagnosis not present

## 2019-09-24 MED ORDER — LAMOTRIGINE 100 MG PO TABS: 100.0000 mg | ORAL_TABLET | Freq: Every day | ORAL | 0 refills | Status: DC

## 2019-09-24 MED ORDER — LAMOTRIGINE 25 MG PO TABS: 75.0000 mg | ORAL_TABLET | Freq: Every day | ORAL | 0 refills | Status: DC

## 2019-09-24 NOTE — Progress Notes (Signed)
Virtual Visit via Video Note  I connected with Janice Gray on 09/24/19 at  4:00 PM EDT by a video enabled telemedicine application and verified that I am speaking with the correct person using two identifiers.   I discussed the limitations of evaluation and management by telemedicine and the availability of in person appointments. The patient expressed understanding and agreed to proceed.     I discussed the assessment and treatment plan with the patient. The patient was provided an opportunity to ask questions and all were answered. The patient agreed with the plan and demonstrated an understanding of the instructions.   The patient was advised to call back or seek an in-person evaluation if the symptoms worsen or if the condition fails to improve as anticipated.   BH MD OP Progress Note  09/24/2019 5:34 PM Tobey BrideLiana Bjelland  MRN:  [id number]  Chief Complaint:  Chief Complaint    Follow-up     HPI: Janice Gray is a 38 year old Caucasian female, married, employed, [place], has a history of MDD, social anxiety disorder, GAD was evaluated by telemedicine today.  Patient today reports that she continues to struggle with mood swings.  She reports she has been having ups and downs.  She has not noticed much benefit from the Lamictal at this dosage.  She is on 50 mg.  She is tolerating it well.  She continues to take Zoloft as prescribed.  She reports sleep also continues to be restless.  She reports she wakes up multiple times at night, rolls over and is able to fall back asleep.  She however does not know why she wakes up that much at night.  She feels tired and fatigued throughout the day.  She does not know if she holds her breath while sleeping.  She does not know if she snores at night or not.  She however reports she can easily fall asleep or doze off in several situations like reading a book, watching TV, being a passenger in the car, at work and so on.  Discussed with patient about  referral for sleep study.  She agrees with plan.  Patient continues to have psychosocial stressors of the pandemic, her children being homeschooled.  It is likely that could also be affecting her sleep as well as her mood.   Visit Diagnosis:    ICD-10-CM   1. Major depressive disorder, recurrent episode, moderate (HCC)  F33.1 lamoTRIgine (LAMICTAL) 25 MG tablet    lamoTRIgine (LAMICTAL) 100 MG tablet  2. Social anxiety disorder  F40.10 lamoTRIgine (LAMICTAL) 25 MG tablet  3. GAD (generalized anxiety disorder)  F41.1 lamoTRIgine (LAMICTAL) 25 MG tablet  4. Insomnia due to mental condition  F51.05     Past Psychiatric History: I have reviewed past psychiatric history from my progress note on 03/19/2018.  Past trials of medications like Abilify-light sensitivity, Seroquel-groggy Wellbutrin-increased anxiety, Xanax  Past Medical History:  Past Medical History:  Diagnosis Date  . Anxiety   . Depression   . Headache    sinus  . History of kidney stones   . Microscopic hematuria   . Motion sickness    cars  . Wears contact lenses     Past Surgical History:  Procedure Laterality Date  . BREAST SURGERY     Implants  . ENDOSCOPIC CONCHA BULLOSA RESECTION Right 11/17/2016   Procedure: ENDOSCOPIC CONCHA BULLOSA RESECTION;  Surgeon: Vernie MurdersPaul Juengel, MD;  [facility];  Service: ENT;  Laterality: Right;  . MANDIBLE FRACTURE  SURGERY    . SEPTOPLASTY N/A 11/17/2016   Procedure: SEPTOPLASTY;  Surgeon: Margaretha Sheffield, MD;  Location: [place];  Service: ENT;  Laterality: N/A;  . TUBAL LIGATION Bilateral 10/26/2018  . TURBINATE REDUCTION Bilateral 11/17/2016   Procedure: PARTIAL INFERIOR TURBINATE REDUCTION;  Surgeon: Margaretha Sheffield, MD;  Location: [place];  Service: ENT;  Laterality: Bilateral;  . Wisdom Teeth Surgery      Family Psychiatric History: I have reviewed family psychiatric history from my progress note on 03/19/2018  Family History:  Family  History  Problem Relation Age of Onset  . Kidney disease Neg Hx   . Bladder Cancer Neg Hx     Social History: I have reviewed social history from my progress note on 03/19/2018 Social History   Socioeconomic History  . Marital status: Married    Spouse name: franklin  . Number of children: 2  . Years of education: Not on file  . Highest education level: Some college, no degree  Occupational History    Comment: fulltime  Social Needs  . Financial resource strain: Somewhat hard  . Food insecurity    Worry: Never true    Inability: Never true  . Transportation needs    Medical: No    Non-medical: No  Tobacco Use  . Smoking status: Current Every Day Smoker    Packs/day: 0.50    Years: 15.00    Pack years: 7.50    Types: E-cigarettes, Cigarettes    Start date: 06/28/2000  . Smokeless tobacco: Never Used  Substance and Sexual Activity  . Alcohol use: Yes    Alcohol/week: 0.0 standard drinks    Comment: special occ./ social  . Drug use: No  . Sexual activity: Yes    Partners: Male    Birth control/protection: None, Surgical, Injection  Lifestyle  . Physical activity    Days per week: 0 days    Minutes per session: 0 min  . Stress: Very much  Relationships  . Social Herbalist on phone: Three times a week    Gets together: Twice a week    Attends religious service: Never    Active member of club or organization: No    Attends meetings of clubs or organizations: Never    Relationship status: Married  Other Topics Concern  . Not on file  Social History Narrative  . Not on file    Allergies:  Allergies  Allergen Reactions  . Abilify [Aripiprazole]     LIGHT SENSITIVITY    Metabolic Disorder Labs: No results found for: HGBA1C, MPG No results found for: PROLACTIN Lab Results  Component Value Date   CHOL 150 05/25/2015   TRIG 75 05/25/2015   HDL 48 05/25/2015   LDLCALC 87 05/25/2015   Lab Results  Component Value Date   TSH 1.866 03/30/2015     Therapeutic Level Labs: No results found for: LITHIUM No results found for: VALPROATE No components found for:  CBMZ  Current Medications: Current Outpatient Medications  Medication Sig Dispense Refill  . norethindrone (ERRIN) 0.35 MG tablet TAKE 1 TABLET BY MOUTH ONCE DAILY    . albuterol (VENTOLIN HFA) 108 (90 Base) MCG/ACT inhaler Inhale 1-2 puffs into the lungs every 6 (six) hours as needed for wheezing (cough). 8 g 0  . benzonatate (TESSALON) 200 MG capsule Take 1 capsule (200 mg total) by mouth 3 (three) times daily as needed. 30 capsule 0  . cetirizine (ZYRTEC) 10 MG tablet Take by mouth.    Marland Kitchen  doxepin (SINEQUAN) 10 MG capsule Take 1-2 capsules (10-20 mg total) by mouth at bedtime as needed. For sleep (Patient not taking: Reported on 12/24/2018) 90 capsule 1  . fluticasone (FLONASE) 50 MCG/ACT nasal spray Place 2 sprays into both nostrils daily.    . hydrOXYzine (VISTARIL) 25 MG capsule Take 1-2 capsules (25-50 mg total) by mouth 3 times/day as needed-between meals & bedtime for anxiety (sleep). 180 capsule 1  . ibuprofen (ADVIL,MOTRIN) 600 MG tablet Take 1 tablet (600 mg total) by mouth every 8 (eight) hours as needed. 15 tablet 0  . lamoTRIgine (LAMICTAL) 100 MG tablet Take 1 tablet (100 mg total) by mouth daily. 90 tablet 0  . lamoTRIgine (LAMICTAL) 25 MG tablet Take 3 tablets (75 mg total) by mouth daily. 180 tablet 0  . Multiple Vitamins-Minerals (MULTIVITAMIN GUMMIES ADULT) CHEW Chew by mouth.    . naproxen sodium (ALEVE) 220 MG tablet Take by mouth.    Marland Kitchen PREVIDENT 5000 DRY MOUTH 1.1 % GEL dental gel     . sertraline (ZOLOFT) 100 MG tablet Take 2 tablets (200 mg total) by mouth daily. 180 tablet 1   No current facility-administered medications for this visit.      Musculoskeletal: Strength & Muscle Tone: UTA Gait & Station: normal Patient leans: N/A  Psychiatric Specialty Exam: Review of Systems  Constitutional: Positive for malaise/fatigue.  Psychiatric/Behavioral:  Positive for depression. The patient is nervous/anxious and has insomnia.   All other systems reviewed and are negative.   There were no vitals taken for this visit.There is no height or weight on file to calculate BMI.  General Appearance: Casual  Eye Contact:  Fair  Speech:  Clear and Coherent  Volume:  Normal  Mood:  Anxious and Depressed  Affect:  Congruent  Thought Process:  Goal Directed and Descriptions of Associations: Intact  Orientation:  Full (Time, Place, and Person)  Thought Content: Logical   Suicidal Thoughts:  No  Homicidal Thoughts:  No  Memory:  Immediate;   Fair Recent;   Fair Remote;   Fair  Judgement:  Fair  Insight:  Fair  Psychomotor Activity:  Normal  Concentration:  Concentration: Fair and Attention Span: Fair  Recall:  [employer] of Knowledge: Fair  Language: Fair  Akathisia:  No  Handed:  Right  AIMS (if indicated): denies tremors, rigidity  Assets:  Communication Skills Desire for Improvement Social Support  ADL's:  Intact  Cognition: WNL  Sleep:  Restless   Screenings:   Assessment and Plan: Janice Gray is a 38 year old Caucasian female who has a history of depression, anxiety was evaluated by telemedicine today.  She continues to struggle with mood swings as well as sleep problems.  Plan as noted below.  Plan MDD-unstable Zoloft 200 mg p.o. daily Increase lamotrigine to 75 mg p.o. daily for 2 weeks.  Increase to 100 mg after that. Hydroxyzine 25 to 50 mg 3 times daily as needed  GAD-some improvement Zoloft as prescribed  Social anxiety disorder-stable We will continue to monitor closely  Insomnia-stable She rarely uses doxepin. Completed Epworth sleep scale-he scored 13 on the same.  Patient with fatigue, lethargy during the day as well as restlessness at night.  She will benefit from a sleep study.  Follow-up in clinic in 1 month or sooner if needed.  I have spent atleast 25 minutes non face to face with patient today. More than 50 %  of the time was spent for psychoeducation and supportive psychotherapy and care coordination. This note  was generated in part or whole with voice recognition software. Voice recognition is usually quite accurate but there are transcription errors that can and very often do occur. I apologize for any typographical errors that were not detected and corrected.       [name], MD 09/24/2019, 5:34 PM

## 2019-10-30 ENCOUNTER — Other Ambulatory Visit: Payer: Self-pay

## 2019-10-30 ENCOUNTER — Ambulatory Visit (INDEPENDENT_AMBULATORY_CARE_PROVIDER_SITE_OTHER): Payer: Self-pay | Admitting: Psychiatry

## 2019-10-30 DIAGNOSIS — Z5329 Procedure and treatment not carried out because of patient's decision for other reasons: Secondary | ICD-10-CM

## 2019-10-30 NOTE — Progress Notes (Signed)
     No response to calls or text.   

## 2019-10-31 ENCOUNTER — Ambulatory Visit: Payer: BC Managed Care – PPO | Admitting: Psychiatry

## 2019-11-12 ENCOUNTER — Other Ambulatory Visit: Payer: Self-pay | Admitting: Psychiatry

## 2019-11-12 DIAGNOSIS — F331 Major depressive disorder, recurrent, moderate: Secondary | ICD-10-CM

## 2019-11-27 ENCOUNTER — Other Ambulatory Visit: Payer: Self-pay | Admitting: Psychiatry

## 2019-11-27 DIAGNOSIS — F411 Generalized anxiety disorder: Secondary | ICD-10-CM

## 2019-11-27 DIAGNOSIS — F4010 Social phobia, unspecified: Secondary | ICD-10-CM

## 2020-01-07 ENCOUNTER — Other Ambulatory Visit: Payer: Self-pay

## 2020-01-07 ENCOUNTER — Ambulatory Visit (INDEPENDENT_AMBULATORY_CARE_PROVIDER_SITE_OTHER): Payer: BC Managed Care – PPO | Admitting: Psychiatry

## 2020-01-07 ENCOUNTER — Encounter: Payer: Self-pay | Admitting: Psychiatry

## 2020-01-07 DIAGNOSIS — F5105 Insomnia due to other mental disorder: Secondary | ICD-10-CM

## 2020-01-07 DIAGNOSIS — F3341 Major depressive disorder, recurrent, in partial remission: Secondary | ICD-10-CM | POA: Diagnosis not present

## 2020-01-07 DIAGNOSIS — F4010 Social phobia, unspecified: Secondary | ICD-10-CM | POA: Diagnosis not present

## 2020-01-07 DIAGNOSIS — F411 Generalized anxiety disorder: Secondary | ICD-10-CM
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
## 2020-02-25 ENCOUNTER — Other Ambulatory Visit: Payer: Self-pay

## 2020-02-25 ENCOUNTER — Ambulatory Visit (INDEPENDENT_AMBULATORY_CARE_PROVIDER_SITE_OTHER): Payer: BC Managed Care – PPO | Admitting: Psychiatry

## 2020-02-25 ENCOUNTER — Encounter: Payer: Self-pay | Admitting: Psychiatry

## 2020-02-25 DIAGNOSIS — F3342 Major depressive disorder, recurrent, in full remission: Secondary | ICD-10-CM

## 2020-02-25 DIAGNOSIS — F4010 Social phobia, unspecified: Secondary | ICD-10-CM

## 2020-02-25 DIAGNOSIS — F411 Generalized anxiety disorder: Secondary | ICD-10-CM

## 2020-02-25 DIAGNOSIS — F3341 Major depressive disorder, recurrent, in partial remission: Secondary | ICD-10-CM | POA: Insufficient documentation

## 2020-02-25 DIAGNOSIS — F5105 Insomnia due to other mental disorder: Secondary | ICD-10-CM | POA: Diagnosis not present

## 2020-02-25 NOTE — Progress Notes (Signed)
Provider Location : [facility] Patient Location : Home  Virtual Visit via Video Note  I connected with Janice Gray on 02/25/20 at  4:00 PM EST by a video enabled telemedicine application and verified that I am speaking with the correct person using two identifiers.   I discussed the limitations of evaluation and management by telemedicine and the availability of in person appointments. The patient expressed understanding and agreed to proceed.   I discussed the assessment and treatment plan with the patient. The patient was provided an opportunity to ask questions and all were answered. The patient agreed with the plan and demonstrated an understanding of the instructions.   The patient was advised to call back or seek an in-person evaluation if the symptoms worsen or if the condition fails to improve as anticipated.   BH MD OP Progress Note  02/25/2020 5:08 PM Janice Gray  MRN:  [id number]  Chief Complaint:  Chief Complaint    Follow-up     HPI: Janice Gray is a 39 year old Caucasian female, married, employed, lives in [place] has a history of MDD, social anxiety disorder, GAD was evaluated by telemedicine today.  Patient today reports her mood symptoms are currently stable.  She is currently compliant on her medications as prescribed.  She reports she is worried about the legal problems and child custody case that is going on.  She however reports she has been coping okay overall.  She is not interested in talking to a psychotherapist since it did not help her in the past.  Patient denies any suicidality, homicidality or perceptual disturbances.  Patient denies any other concerns today. Visit Diagnosis:    ICD-10-CM   1. MDD (major depressive disorder), recurrent, in full remission (HCC)  F33.42   2. Social anxiety disorder  F40.10   3. GAD (generalized anxiety disorder)  F41.1   4. Insomnia due to mental condition  F51.05     Past Psychiatric History: I have reviewed past  psychiatric history from my progress note on 03/19/2018.  Past trials of medications like Abilify-light sensitivity, Seroquel-groggy, Wellbutrin-increased anxiety, Xanax  Past Medical History:  Past Medical History:  Diagnosis Date  . Anxiety   . Depression   . Headache    sinus  . History of kidney stones   . Microscopic hematuria   . Motion sickness    cars  . Wears contact lenses     Past Surgical History:  Procedure Laterality Date  . BREAST SURGERY     Implants  . ENDOSCOPIC CONCHA BULLOSA RESECTION Right 11/17/2016   Procedure: ENDOSCOPIC CONCHA BULLOSA RESECTION;  Surgeon: Vernie Murders, MD;  Location: [facility];  Service: ENT;  Laterality: Right;  . MANDIBLE FRACTURE SURGERY    . SEPTOPLASTY N/A 11/17/2016   Procedure: SEPTOPLASTY;  Surgeon: Vernie Murders, MD;  Location: [facility];  Service: ENT;  Laterality: N/A;  . TUBAL LIGATION Bilateral 10/26/2018  . TURBINATE REDUCTION Bilateral 11/17/2016   Procedure: PARTIAL INFERIOR TURBINATE REDUCTION;  Surgeon: Vernie Murders, MD;  Location: [facility];  Service: ENT;  Laterality: Bilateral;  . Wisdom Teeth Surgery      Family Psychiatric History: I have reviewed family psychiatric history from my progress note on 03/19/2018  Family History:  Family History  Problem Relation Age of Onset  . Kidney disease Neg Hx   . Bladder Cancer Neg Hx     Social History: I have reviewed social history from my progress note on 03/19/2018 Social History   Socioeconomic History  .  Marital status: Married    Spouse name: franklin  . Number of children: 2  . Years of education: Not on file  . Highest education level: Some college, no degree  Occupational History    Comment: fulltime  Tobacco Use  . Smoking status: Current Every Day Smoker    Packs/day: 0.50    Years: 15.00    Pack years: 7.50    Types: E-cigarettes, Cigarettes    Start date: 06/28/2000  . Smokeless tobacco: Never Used  Substance and Sexual  Activity  . Alcohol use: Yes    Alcohol/week: 0.0 standard drinks    Comment: special occ./ social  . Drug use: No  . Sexual activity: Yes    Partners: Male    Birth control/protection: None, Surgical, Injection  Other Topics Concern  . Not on file  Social History Narrative  . Not on file   Social Determinants of Health   Financial Resource Strain:   . Difficulty of Paying Living Expenses: Not on file  Food Insecurity:   . Worried About Programme researcher, broadcasting/film/video in the Last Year: Not on file  . Ran Out of Food in the Last Year: Not on file  Transportation Needs:   . Lack of Transportation (Medical): Not on file  . Lack of Transportation (Non-Medical): Not on file  Physical Activity:   . Days of Exercise per Week: Not on file  . Minutes of Exercise per Session: Not on file  Stress:   . Feeling of Stress : Not on file  Social Connections:   . Frequency of Communication with Friends and Family: Not on file  . Frequency of Social Gatherings with Friends and Family: Not on file  . Attends Religious Services: Not on file  . Active Member of Clubs or Organizations: Not on file  . Attends Banker Meetings: Not on file  . Marital Status: Not on file    Allergies:  Allergies  Allergen Reactions  . Abilify [Aripiprazole]     LIGHT SENSITIVITY    Metabolic Disorder Labs: No results found for: HGBA1C, MPG No results found for: PROLACTIN Lab Results  Component Value Date   CHOL 150 05/25/2015   TRIG 75 05/25/2015   HDL 48 05/25/2015   LDLCALC 87 05/25/2015   Lab Results  Component Value Date   TSH 1.866 03/30/2015    Therapeutic Level Labs: No results found for: LITHIUM No results found for: VALPROATE No components found for:  CBMZ  Current Medications: Current Outpatient Medications  Medication Sig Dispense Refill  . albuterol (VENTOLIN HFA) 108 (90 Base) MCG/ACT inhaler Inhale 1-2 puffs into the lungs every 6 (six) hours as needed for wheezing (cough). 8  g 0  . benzonatate (TESSALON) 200 MG capsule Take 1 capsule (200 mg total) by mouth 3 (three) times daily as needed. 30 capsule 0  . cetirizine (ZYRTEC) 10 MG tablet Take by mouth.    . doxepin (SINEQUAN) 10 MG capsule Take 1-2 capsules (10-20 mg total) by mouth at bedtime as needed. For sleep (Patient not taking: Reported on 12/24/2018) 90 capsule 1  . fluticasone (FLONASE) 50 MCG/ACT nasal spray Place 2 sprays into both nostrils daily.    . hydrOXYzine (VISTARIL) 25 MG capsule Take 1-2 capsules (25-50 mg total) by mouth 3 times/day as needed-between meals & bedtime for anxiety (sleep). 180 capsule 1  . ibuprofen (ADVIL,MOTRIN) 600 MG tablet Take 1 tablet (600 mg total) by mouth every 8 (eight) hours as needed. 15 tablet 0  .  lamoTRIgine (LAMICTAL) 100 MG tablet TAKE 1 TABLET BY MOUTH  DAILY - DOSE INCREASE 90 tablet 3  . Multiple Vitamins-Minerals (MULTIVITAMIN GUMMIES ADULT) CHEW Chew by mouth.    . naproxen sodium (ALEVE) 220 MG tablet Take by mouth.    . norethindrone (ERRIN) 0.35 MG tablet TAKE 1 TABLET BY MOUTH ONCE DAILY    . PREVIDENT 5000 DRY MOUTH 1.1 % GEL dental gel     . sertraline (ZOLOFT) 100 MG tablet TAKE 2 TABLETS BY MOUTH  DAILY 180 tablet 3   No current facility-administered medications for this visit.     Musculoskeletal: Strength & Muscle Tone: UTA Gait & Station: normal Patient leans: N/A  Psychiatric Specialty Exam: Review of Systems  Psychiatric/Behavioral: Positive for sleep disturbance (Improving).  All other systems reviewed and are negative.   There were no vitals taken for this visit.There is no height or weight on file to calculate BMI.  General Appearance: Casual  Eye Contact:  Fair  Speech:  Normal Rate  Volume:  Normal  Mood:  Euthymic  Affect:  Congruent  Thought Process:  Goal Directed and Descriptions of Associations: Intact  Orientation:  Full (Time, Place, and Person)  Thought Content: Logical   Suicidal Thoughts:  No  Homicidal Thoughts:   No  Memory:  Immediate;   Fair Recent;   Fair Remote;   Fair  Judgement:  Fair  Insight:  Fair  Psychomotor Activity:  Normal  Concentration:  Concentration: Fair and Attention Span: Fair  Recall:  [employer] of Knowledge: Fair  Language: Fair  Akathisia:  No  Handed:  Right  AIMS (if indicated): UTA  Assets:  Communication Skills Desire for Improvement Housing Social Support  ADL's:  Intact  Cognition: WNL  Sleep:  Improving   Screenings:   Assessment and Plan: Janice Gray is a 39 year old Caucasian female who has a history of depression, anxiety was evaluated by telemedicine today.  Patient is currently stable on current medication regimen however does have sleep issues which she reports are improving now that she is back on doxepin.  Plan as noted below.  Plan MDD in remission Zoloft 200 mg p.o. daily Lamictal 100 mg p.o. daily Hydroxyzine 25 to 50 mg p.o. 3 times daily as needed  GAD-improving Zoloft as prescribed  Social anxiety disorder-stable  we will continue to monitor closely  Insomnia-improving Doxepin 10 to 20 mg p.o. nightly as needed  Offered referral to CBT given the current situational stressors however she declined.  Follow-up in clinic in 6 to 8 weeks or sooner if needed.  I have spent atleast 20 minutes non face to face with patient today. More than 50 % of the time was spent for  ordering medications and test ,psychoeducation and supportive psychotherapy and care coordination,as well as documenting clinical information in electronic health record. This note was generated in part or whole with voice recognition software. Voice recognition is usually quite accurate but there are transcription errors that can and very often do occur. I apologize for any typographical errors that were not detected and corrected.         Ursula Alert, MD 02/25/2020, 5:08 PM

## 2020-02-29 ENCOUNTER — Ambulatory Visit: Payer: BC Managed Care – PPO

## 2020-04-16 ENCOUNTER — Encounter: Payer: Self-pay | Admitting: Psychiatry

## 2020-04-16 ENCOUNTER — Telehealth (INDEPENDENT_AMBULATORY_CARE_PROVIDER_SITE_OTHER): Payer: BC Managed Care – PPO | Admitting: Psychiatry

## 2020-04-16 ENCOUNTER — Other Ambulatory Visit: Payer: Self-pay

## 2020-04-16 DIAGNOSIS — F411 Generalized anxiety disorder: Secondary | ICD-10-CM | POA: Diagnosis not present

## 2020-04-16 DIAGNOSIS — F3342 Major depressive disorder, recurrent, in full remission: Secondary | ICD-10-CM | POA: Diagnosis not present

## 2020-04-16 DIAGNOSIS — F4010 Social phobia, unspecified: Secondary | ICD-10-CM

## 2020-04-16 MED ORDER — LAMOTRIGINE 25 MG PO TABS: 50.0000 mg | ORAL_TABLET | Freq: Every day | ORAL | 0 refills | Status: DC

## 2020-04-16 MED ORDER — DOXEPIN HCL 10 MG PO CAPS: 10.0000 mg | ORAL_CAPSULE | Freq: Every evening | ORAL | 1 refills | Status: DC | PRN

## 2020-04-16 NOTE — Progress Notes (Signed)
Provider Location : [facility] Patient Location : Home  Virtual Visit via Video Note  I connected with Janice Gray on 04/16/20 at  4:40 PM EDT by a video enabled telemedicine application and verified that I am speaking with the correct person using two identifiers.   I discussed the limitations of evaluation and management by telemedicine and the availability of in person appointments. The patient expressed understanding and agreed to proceed.    I discussed the assessment and treatment plan with the patient. The patient was provided an opportunity to ask questions and all were answered. The patient agreed with the plan and demonstrated an understanding of the instructions.   The patient was advised to call back or seek an in-person evaluation if the symptoms worsen or if the condition fails to improve as anticipated.   BH MD OP Progress Note  04/16/2020 5:49 PM [name]  MRN:  [id number]  Chief Complaint:  Chief Complaint    Follow-up     HPI: Janice Gray is a 39 year old Caucasian female, married, employed, lives in [place], has a history of MDD, social anxiety disorder, GAD, was evaluated by telemedicine today.  Patient today reports she is currently making progress on the current medication regimen.  She denies any significant anxiety or depressive symptoms.  She reports sleep is good.  She does need the doxepin on and off.  She however does not take it every day.  Patient denies any suicidality, homicidality or perceptual disturbances.  She reports she reduced her Lamictal to 50 mg daily.  She reports she had trouble with the 100 mg  tablet.  She reports it melted in her mouth and she did not like the way it made her feel.  She reports she was going into an anxiety attack whenever it melted in her mouth.  She reports she has started taking 2 of the 25 mg tablets instead.  She reports she has not noticed much changes in her mood since reducing the dosage.  She hence wants to stay on  the 50 mg.  Patient continues to have psychosocial stressors of legal problems, child custody case.  She reports her court hearing was continued and has upcoming court hearing on June 2.  Patient denies any other concerns today.  Visit Diagnosis:    ICD-10-CM   1. MDD (major depressive disorder), recurrent, in full remission (HCC)  F33.42 lamoTRIgine (LAMICTAL) 25 MG tablet    doxepin (SINEQUAN) 10 MG capsule  2. Social anxiety disorder  F40.10 doxepin (SINEQUAN) 10 MG capsule  3. GAD (generalized anxiety disorder)  F41.1 doxepin (SINEQUAN) 10 MG capsule    Past Psychiatric History: I have reviewed past psychiatric history from my progress note on 03/19/2018.  Past trials of medications like Abilify-light sensitivity, Seroquel-groggy, Wellbutrin-increased anxiety, Xanax  Past Medical History:  Past Medical History:  Diagnosis Date  . Anxiety   . Depression   . Headache    sinus  . History of kidney stones   . Microscopic hematuria   . Motion sickness    cars  . Wears contact lenses     Past Surgical History:  Procedure Laterality Date  . BREAST SURGERY     Implants  . ENDOSCOPIC CONCHA BULLOSA RESECTION Right 11/17/2016   Procedure: ENDOSCOPIC CONCHA BULLOSA RESECTION;  Surgeon: Vernie Murders, MD;  Location: [facility];  Service: ENT;  Laterality: Right;  . MANDIBLE FRACTURE SURGERY    . SEPTOPLASTY N/A 11/17/2016   Procedure: SEPTOPLASTY;  Surgeon: Vernie Murders, MD;  Location: [facility];  Service: ENT;  Laterality: N/A;  . TUBAL LIGATION Bilateral 10/26/2018  . TURBINATE REDUCTION Bilateral 11/17/2016   Procedure: PARTIAL INFERIOR TURBINATE REDUCTION;  Surgeon: Vernie Murders, MD;  Location: [facility];  Service: ENT;  Laterality: Bilateral;  . Wisdom Teeth Surgery      Family Psychiatric History: I have reviewed family psychiatric history from my progress note on 03/19/2018.  Family History:  Family History  Problem Relation Age of Onset  .  Kidney disease Neg Hx   . Bladder Cancer Neg Hx     Social History:  Social History   Socioeconomic History  . Marital status: Married    Spouse name: Janice Gray  . Number of children: 2  . Years of education: Not on file  . Highest education level: Some college, no degree  Occupational History    Comment: fulltime  Tobacco Use  . Smoking status: Current Every Day Smoker    Packs/day: 0.50    Years: 15.00    Pack years: 7.50    Types: E-cigarettes, Cigarettes    Start date: 06/28/2000  . Smokeless tobacco: Never Used  Substance and Sexual Activity  . Alcohol use: Yes    Alcohol/week: 0.0 standard drinks    Comment: special occ./ social  . Drug use: No  . Sexual activity: Yes    Partners: Male    Birth control/protection: None, Surgical, Injection  Other Topics Concern  . Not on file  Social History Narrative  . Not on file   Social Determinants of Health   Financial Resource Strain:   . Difficulty of Paying Living Expenses:   Food Insecurity:   . Worried About Programme researcher, broadcasting/film/video in the Last Year:   . Barista in the Last Year:   Transportation Needs:   . Freight forwarder (Medical):   Marland Kitchen Lack of Transportation (Non-Medical):   Physical Activity:   . Days of Exercise per Week:   . Minutes of Exercise per Session:   Stress:   . Feeling of Stress :   Social Connections:   . Frequency of Communication with Friends and Family:   . Frequency of Social Gatherings with Friends and Family:   . Attends Religious Services:   . Active Member of Clubs or Organizations:   . Attends Banker Meetings:   Marland Kitchen Marital Status:     Allergies:  Allergies  Allergen Reactions  . Abilify [Aripiprazole]     LIGHT SENSITIVITY    Metabolic Disorder Labs: No results found for: HGBA1C, MPG No results found for: PROLACTIN Lab Results  Component Value Date   CHOL 150 05/25/2015   TRIG 75 05/25/2015   HDL 48 05/25/2015   LDLCALC 87 05/25/2015   Lab  Results  Component Value Date   TSH 1.866 03/30/2015    Therapeutic Level Labs: No results found for: LITHIUM No results found for: VALPROATE No components found for:  CBMZ  Current Medications: Current Outpatient Medications  Medication Sig Dispense Refill  . cetirizine (ZYRTEC) 10 MG tablet Take by mouth.    . doxepin (SINEQUAN) 10 MG capsule Take 1-2 capsules (10-20 mg total) by mouth at bedtime as needed. For sleep 90 capsule 1  . fluticasone (FLONASE) 50 MCG/ACT nasal spray Place 2 sprays into both nostrils daily.    . hydrOXYzine (VISTARIL) 25 MG capsule Take 1-2 capsules (25-50 mg total) by mouth 3 times/day as needed-between meals & bedtime for anxiety (sleep). 180 capsule 1  .  ibuprofen (ADVIL,MOTRIN) 600 MG tablet Take 1 tablet (600 mg total) by mouth every 8 (eight) hours as needed. 15 tablet 0  . lamoTRIgine (LAMICTAL) 25 MG tablet Take 2 tablets (50 mg total) by mouth daily. 180 tablet 0  . naproxen sodium (ALEVE) 220 MG tablet Take by mouth.    . norethindrone (ERRIN) 0.35 MG tablet TAKE 1 TABLET BY MOUTH ONCE DAILY    . PREVIDENT 5000 DRY MOUTH 1.1 % GEL dental gel     . sertraline (ZOLOFT) 100 MG tablet TAKE 2 TABLETS BY MOUTH  DAILY 180 tablet 3  . Multiple Vitamins-Minerals (MULTIVITAMIN GUMMIES ADULT) CHEW Chew by mouth.     No current facility-administered medications for this visit.     Musculoskeletal: Strength & Muscle Tone: UTA Gait & Station: normal Patient leans: N/A  Psychiatric Specialty Exam: Review of Systems  Psychiatric/Behavioral: Negative for agitation, behavioral problems, confusion, decreased concentration, dysphoric mood, hallucinations, self-injury, sleep disturbance and suicidal ideas. The patient is not nervous/anxious and is not hyperactive.   All other systems reviewed and are negative.   There were no vitals taken for this visit.There is no height or weight on file to calculate BMI.  General Appearance: Casual  Eye Contact:  Fair   Speech:  Clear and Coherent  Volume:  Normal  Mood:  Euthymic  Affect:  Congruent  Thought Process:  Goal Directed and Descriptions of Associations: Intact  Orientation:  Full (Time, Place, and Person)  Thought Content: Logical   Suicidal Thoughts:  No  Homicidal Thoughts:  No  Memory:  Immediate;   Fair Recent;   Fair Remote;   Fair  Judgement:  Fair  Insight:  Fair  Psychomotor Activity:  Normal  Concentration:  Concentration: Fair and Attention Span: Fair  Recall:  [employer] of Knowledge: Fair  Language: Fair  Akathisia:  No  Handed:  Right  AIMS (if indicated): UTA  Assets:  Communication Skills Desire for Improvement Housing Social Support  ADL's:  Intact  Cognition: WNL  Sleep:  Fair   Screenings:   Assessment and Plan: Jassica is a 39 year old Caucasian female who has a history of depression, anxiety was evaluated by telemedicine today.  Patient is currently stable on current medication regimen.  Plan as noted below.  Plan MDD in remission Zoloft 200 mg p.o. daily Reduce Lamictal to 50 mg p.o. daily-based on patient preference. Hydroxyzine 25 to 50 mg p.o. 3 times daily as needed  GAD-improving Zoloft as prescribed   Social anxiety disorder-stable We will continue to monitor closely  Insomnia-improving Doxepin 10 to 20 mg p.o. nightly as needed  Follow-up in clinic in 4 to 5 weeks or sooner if needed.  I have spent atleast 20 minutes non  face to face with patient today. More than 50 % of the time was spent for preparing to see the patient ( e.g., review of test, records ),  ordering medications and test ,psychoeducation and supportive psychotherapy and care coordination,as well as documenting clinical information in electronic health record. This note was generated in part or whole with voice recognition software. Voice recognition is usually quite accurate but there are transcription errors that can and very often do occur. I apologize for any  typographical errors that were not detected and corrected.         Ursula Alert, MD 04/16/2020, 5:49 PM

## 2020-05-17 ENCOUNTER — Other Ambulatory Visit: Payer: Self-pay | Admitting: Psychiatry

## 2020-05-17 DIAGNOSIS — F411 Generalized anxiety disorder: Secondary | ICD-10-CM

## 2020-05-17 DIAGNOSIS — F4010 Social phobia, unspecified: Secondary | ICD-10-CM

## 2020-05-17 DIAGNOSIS — F3342 Major depressive disorder, recurrent, in full remission: Secondary | ICD-10-CM

## 2020-05-21 ENCOUNTER — Encounter: Payer: Self-pay | Admitting: Psychiatry

## 2020-05-21 ENCOUNTER — Telehealth (INDEPENDENT_AMBULATORY_CARE_PROVIDER_SITE_OTHER): Payer: BC Managed Care – PPO | Admitting: Psychiatry

## 2020-05-21 ENCOUNTER — Other Ambulatory Visit: Payer: Self-pay

## 2020-05-21 DIAGNOSIS — F411 Generalized anxiety disorder: Secondary | ICD-10-CM

## 2020-05-21 DIAGNOSIS — F3342 Major depressive disorder, recurrent, in full remission: Secondary | ICD-10-CM | POA: Insufficient documentation

## 2020-05-21 DIAGNOSIS — F4010 Social phobia, unspecified: Secondary | ICD-10-CM

## 2020-05-21 NOTE — Progress Notes (Signed)
Provider Location : [facility] Patient Location : Home  Virtual Visit via Video Note  I connected with Alyvia Valone on 05/21/20 at  3:40 PM EDT by a video enabled telemedicine application and verified that I am speaking with the correct person using two identifiers.   I discussed the limitations of evaluation and management by telemedicine and the availability of in person appointments. The patient expressed understanding and agreed to proceed.   I discussed the assessment and treatment plan with the patient. The patient was provided an opportunity to ask questions and all were answered. The patient agreed with the plan and demonstrated an understanding of the instructions.   The patient was advised to call back or seek an in-person evaluation if the symptoms worsen or if the condition fails to improve as anticipated.   BH MD OP Progress Note  05/21/2020 3:53 PM Dilpreet Faires  MRN:  [id number]  Chief Complaint:  Chief Complaint    Follow-up     HPI: Curtisha Bendix is a 39 year old Caucasian female, married, employed, lives in [place], has a history of MDD, social anxiety disorder, GAD was evaluated by telemedicine today.  Patient today reports she is currently doing well on the current medication regimen.  She denies any significant anxiety or depressive symptoms.  She reports sleep continues to be good.  She uses the doxepin only as needed.  She is compliant on the Zoloft and the Lamictal as prescribed.  Denies side effects.  She reports appetite is fair.  She reports work is going well.  Patient denies any suicidality, homicidality or perceptual disturbances.  She reports they had the court hearing yesterday and her husband has been ordered by court to do community work as well as take parenting classes.  She reports she is currently looking into the same.  Patient otherwise denies any other stressors.  She reports she is not interested in psychotherapy sessions and is coping  okay.  She is not willing to quit smoking.      Visit Diagnosis:    ICD-10-CM   1. MDD (major depressive disorder), recurrent, in full remission (HCC)  F33.42   2. Social anxiety disorder  F40.10   3. GAD (generalized anxiety disorder)  F41.1     Past Psychiatric History: I have reviewed past psychiatric history from my progress note on 03/19/2018.  Past trials of medications like Abilify-light sensitivity, Seroquel-groggy Wellbutrin-increase anxiety, Xanax  Past Medical History:  Past Medical History:  Diagnosis Date  . Anxiety   . Depression   . Headache    sinus  . History of kidney stones   . Microscopic hematuria   . Motion sickness    cars  . Wears contact lenses     Past Surgical History:  Procedure Laterality Date  . BREAST SURGERY     Implants  . ENDOSCOPIC CONCHA BULLOSA RESECTION Right 11/17/2016   Procedure: ENDOSCOPIC CONCHA BULLOSA RESECTION;  Surgeon: Vernie Murders, MD;  Location: [facility];  Service: ENT;  Laterality: Right;  . MANDIBLE FRACTURE SURGERY    . SEPTOPLASTY N/A 11/17/2016   Procedure: SEPTOPLASTY;  Surgeon: Vernie Murders, MD;  Location: [facility];  Service: ENT;  Laterality: N/A;  . TUBAL LIGATION Bilateral 10/26/2018  . TURBINATE REDUCTION Bilateral 11/17/2016   Procedure: PARTIAL INFERIOR TURBINATE REDUCTION;  Surgeon: Vernie Murders, MD;  Location: [facility];  Service: ENT;  Laterality: Bilateral;  . Wisdom Teeth Surgery      Family Psychiatric History: I have reviewed family  psychiatric history from my progress note on 03/19/2018  Family History:  Family History  Problem Relation Age of Onset  . Kidney disease Neg Hx   . Bladder Cancer Neg Hx     Social History: Reviewed social history from my progress note on 03/19/2018 Social History   Socioeconomic History  . Marital status: Married    Spouse name: franklin  . Number of children: 2  . Years of education: Not on file  . Highest education level: Some  college, no degree  Occupational History    Comment: fulltime  Tobacco Use  . Smoking status: Current Every Day Smoker    Packs/day: 0.50    Years: 15.00    Pack years: 7.50    Types: E-cigarettes, Cigarettes    Start date: 06/28/2000  . Smokeless tobacco: Never Used  Substance and Sexual Activity  . Alcohol use: Yes    Alcohol/week: 0.0 standard drinks    Comment: special occ./ social  . Drug use: No  . Sexual activity: Yes    Partners: Male    Birth control/protection: None, Surgical, Injection  Other Topics Concern  . Not on file  Social History Narrative  . Not on file   Social Determinants of Health   Financial Resource Strain:   . Difficulty of Paying Living Expenses:   Food Insecurity:   . Worried About Programme researcher, broadcasting/film/video in the Last Year:   . Barista in the Last Year:   Transportation Needs:   . Freight forwarder (Medical):   Marland Kitchen Lack of Transportation (Non-Medical):   Physical Activity:   . Days of Exercise per Week:   . Minutes of Exercise per Session:   Stress:   . Feeling of Stress :   Social Connections:   . Frequency of Communication with Friends and Family:   . Frequency of Social Gatherings with Friends and Family:   . Attends Religious Services:   . Active Member of Clubs or Organizations:   . Attends Banker Meetings:   Marland Kitchen Marital Status:     Allergies:  Allergies  Allergen Reactions  . Abilify [Aripiprazole]     LIGHT SENSITIVITY    Metabolic Disorder Labs: No results found for: HGBA1C, MPG No results found for: PROLACTIN Lab Results  Component Value Date   CHOL 150 05/25/2015   TRIG 75 05/25/2015   HDL 48 05/25/2015   LDLCALC 87 05/25/2015   Lab Results  Component Value Date   TSH 1.866 03/30/2015    Therapeutic Level Labs: No results found for: LITHIUM No results found for: VALPROATE No components found for:  CBMZ  Current Medications: Current Outpatient Medications  Medication Sig Dispense Refill   . cetirizine (ZYRTEC) 10 MG tablet Take by mouth.    . doxepin (SINEQUAN) 10 MG capsule TAKE 1 TO 2 CAPSULES BY  MOUTH AT BEDTIME AS NEEDED  FOR SLEEP 90 capsule 7  . fluticasone (FLONASE) 50 MCG/ACT nasal spray Place 2 sprays into both nostrils daily.    . hydrOXYzine (VISTARIL) 25 MG capsule Take 1-2 capsules (25-50 mg total) by mouth 3 times/day as needed-between meals & bedtime for anxiety (sleep). 180 capsule 1  . ibuprofen (ADVIL,MOTRIN) 600 MG tablet Take 1 tablet (600 mg total) by mouth every 8 (eight) hours as needed. 15 tablet 0  . lamoTRIgine (LAMICTAL) 25 MG tablet Take 2 tablets (50 mg total) by mouth daily. 180 tablet 0  . metroNIDAZOLE (FLAGYL) 500 MG tablet Take 500 mg  by mouth 2 (two) times daily.    . Multiple Vitamins-Minerals (MULTIVITAMIN GUMMIES ADULT) CHEW Chew by mouth.    . naproxen sodium (ALEVE) 220 MG tablet Take by mouth.    . norethindrone (ERRIN) 0.35 MG tablet TAKE 1 TABLET BY MOUTH ONCE DAILY    . PREVIDENT 5000 DRY MOUTH 1.1 % GEL dental gel     . sertraline (ZOLOFT) 100 MG tablet TAKE 2 TABLETS BY MOUTH  DAILY 180 tablet 3   No current facility-administered medications for this visit.     Musculoskeletal: Strength & Muscle Tone: UTA Gait & Station: normal Patient leans: N/A  Psychiatric Specialty Exam: Review of Systems  Psychiatric/Behavioral: Negative for agitation, behavioral problems, confusion, decreased concentration, dysphoric mood, hallucinations, self-injury, sleep disturbance and suicidal ideas. The patient is not nervous/anxious and is not hyperactive.   All other systems reviewed and are negative.   There were no vitals taken for this visit.There is no height or weight on file to calculate BMI.  General Appearance: Casual  Eye Contact:  Fair  Speech:  Clear and Coherent  Volume:  Normal  Mood:  Euthymic  Affect:  Congruent  Thought Process:  Goal Directed and Descriptions of Associations: Intact  Orientation:  Full (Time, Place, and  Person)  Thought Content: Logical   Suicidal Thoughts:  No  Homicidal Thoughts:  No  Memory:  Immediate;   Fair Recent;   Fair Remote;   Fair  Judgement:  Fair  Insight:  Fair  Psychomotor Activity:  Normal  Concentration:  Concentration: Fair and Attention Span: Fair  Recall:  [employer] of Knowledge: Fair  Language: Fair  Akathisia:  No  Handed:  Right  AIMS (if indicated): UTA  Assets:  Communication Skills Desire for Improvement Housing Social Support  ADL's:  Intact  Cognition: WNL  Sleep:  Fair   Screenings:   Assessment and Plan: Valborg Friar is a 39 year old Caucasian female who has a history of depression and anxiety was evaluated by telemedicine today.  Patient with psychosocial stressors of recent legal problems, the pandemic.  Patient however is coping okay.  Patient is currently stable on medication.  Plan as noted below.  Plan MDD in remission Zoloft 200 mg p.o. daily Lamictal at reduced dose to 50 mg p.o. daily Hydroxyzine 25 to 50 mg p.o. 3 times daily as needed  GAD-improving Zoloft as prescribed Patient declines referral for psychotherapy sessions.  Social anxiety disorder-stable Will monitor closely  Insomnia-improving Doxepin 10 to 20 mg p.o. nightly as needed  Follow-up in clinic in 2 to 3 months or sooner if needed.  I have spent atleast 20 minutes non face to face with patient today. More than 50 % of the time was spent for preparing to see the patient ( e.g., review of test, records ), ordering medications and test ,psychoeducation and supportive psychotherapy and care coordination,as well as documenting clinical information in electronic health record. This note was generated in part or whole with voice recognition software. Voice recognition is usually quite accurate but there are transcription errors that can and very often do occur. I apologize for any typographical errors that were not detected and corrected.        Ursula Alert, MD 05/21/2020, 3:53 PM

## 2020-06-17 ENCOUNTER — Other Ambulatory Visit: Payer: Self-pay | Admitting: Psychiatry

## 2020-06-17 DIAGNOSIS — F3342 Major depressive disorder, recurrent, in full remission: Secondary | ICD-10-CM

## 2020-08-27 ENCOUNTER — Other Ambulatory Visit: Payer: Self-pay

## 2020-08-27 ENCOUNTER — Telehealth (INDEPENDENT_AMBULATORY_CARE_PROVIDER_SITE_OTHER): Payer: BC Managed Care – PPO | Admitting: Psychiatry

## 2020-08-27 DIAGNOSIS — F3342 Major depressive disorder, recurrent, in full remission: Secondary | ICD-10-CM

## 2020-08-27 DIAGNOSIS — F411 Generalized anxiety disorder: Secondary | ICD-10-CM

## 2020-08-27 DIAGNOSIS — F4010 Social phobia, unspecified: Secondary | ICD-10-CM

## 2020-08-27 NOTE — Progress Notes (Signed)
No response to call or text or video invite  

## 2020-09-02 ENCOUNTER — Encounter: Payer: Self-pay | Admitting: Psychiatry

## 2020-09-02 ENCOUNTER — Other Ambulatory Visit: Payer: Self-pay

## 2020-09-02 ENCOUNTER — Telehealth (INDEPENDENT_AMBULATORY_CARE_PROVIDER_SITE_OTHER): Payer: BC Managed Care – PPO | Admitting: Psychiatry

## 2020-09-02 DIAGNOSIS — F3342 Major depressive disorder, recurrent, in full remission: Secondary | ICD-10-CM

## 2020-09-02 DIAGNOSIS — F4010 Social phobia, unspecified: Secondary | ICD-10-CM | POA: Diagnosis not present

## 2020-09-02 DIAGNOSIS — F411 Generalized anxiety disorder: Secondary | ICD-10-CM

## 2020-09-02 NOTE — Progress Notes (Signed)
Provider Location : [facility] Patient Location : Home  Participants: Patient , Provider  Virtual Visit via Video Note  I connected with Janice Gray on 09/02/20 at  2:00 PM EDT by a video enabled telemedicine application and verified that I am speaking with the correct person using two identifiers.   I discussed the limitations of evaluation and management by telemedicine and the availability of in person appointments. The patient expressed understanding and agreed to proceed.     I discussed the assessment and treatment plan with the patient. The patient was provided an opportunity to ask questions and all were answered. The patient agreed with the plan and demonstrated an understanding of the instructions.   The patient was advised to call back or seek an in-person evaluation if the symptoms worsen or if the condition fails to improve as anticipated.   BH MD OP Progress Note  09/02/2020 2:26 PM Janice Gray  MRN:  [id number]  Chief Complaint:  Chief Complaint    Follow-up     HPI: Janice Gray is a 39 year old Caucasian female, married, employed, lives in [place], has a history of MDD, social anxiety disorder, GAD was evaluated by telemedicine today.  Patient today reports mood wise she is doing okay.  She does not feel sad.  She denies any anxiety symptoms.  She reports sleep is good.  She uses the doxepin only as needed.   She is compliant on Zoloft and Lamictal as prescribed.  She however reports she does feel unmotivated to do things. When attempted to clarify this patient reports that she is able to work at her job and she loves her work.  She also reports she is able to do the chores around the house.  She however reports when it comes to making appointments or picking up a hobby she is unable to do that.  She has always been this way.  She also reports chronic fatigue.  She was referred to sleep clinic for a sleep study almost a year ago however she never followed  through with recommendations.  Patient denies any suicidality, homicidality or perceptual disturbances.  Patient denies any other concerns today.  Visit Diagnosis:    ICD-10-CM   1. MDD (major depressive disorder), recurrent, in full remission (HCC)  F33.42   2. Social anxiety disorder  F40.10   3. GAD (generalized anxiety disorder)  F41.1     Past Psychiatric History: I have reviewed past psychiatric history from my progress note on 03/19/2018.  Past trials of medications like Abilify-light sensitivity, Seroquel-groggy.  Wellbutrin-increased anxiety, Xanax.  Past Medical History:  Past Medical History:  Diagnosis Date  . Anxiety   . Depression   . Headache    sinus  . History of kidney stones   . Microscopic hematuria   . Motion sickness    cars  . Wears contact lenses     Past Surgical History:  Procedure Laterality Date  . BREAST SURGERY     Implants  . ENDOSCOPIC CONCHA BULLOSA RESECTION Right 11/17/2016   Procedure: ENDOSCOPIC CONCHA BULLOSA RESECTION;  Surgeon: Vernie Murders, MD;  Location: [facility];  Service: ENT;  Laterality: Right;  . MANDIBLE FRACTURE SURGERY    . SEPTOPLASTY N/A 11/17/2016   Procedure: SEPTOPLASTY;  Surgeon: Vernie Murders, MD;  Location: [facility];  Service: ENT;  Laterality: N/A;  . TUBAL LIGATION Bilateral 10/26/2018  . TURBINATE REDUCTION Bilateral 11/17/2016   Procedure: PARTIAL INFERIOR TURBINATE REDUCTION;  Surgeon: Vernie Murders, MD;  Location:  [facility];  Service: ENT;  Laterality: Bilateral;  . Wisdom Teeth Surgery      Family Psychiatric History: I have reviewed family psychiatric history from my progress note on 03/19/2018.  Family History:  Family History  Problem Relation Age of Onset  . Kidney disease Neg Hx   . Bladder Cancer Neg Hx     Social History: I have reviewed social history from my progress note on 03/19/2018. Social History   Socioeconomic History  . Marital status: Married    Spouse  name: franklin  . Number of children: 2  . Years of education: Not on file  . Highest education level: Some college, no degree  Occupational History    Comment: fulltime  Tobacco Use  . Smoking status: Current Every Day Smoker    Packs/day: 0.50    Years: 15.00    Pack years: 7.50    Types: E-cigarettes, Cigarettes    Start date: 06/28/2000  . Smokeless tobacco: Never Used  Vaping Use  . Vaping Use: Never used  Substance and Sexual Activity  . Alcohol use: Yes    Alcohol/week: 0.0 standard drinks    Comment: special occ./ social  . Drug use: No  . Sexual activity: Yes    Partners: Male    Birth control/protection: None, Surgical, Injection  Other Topics Concern  . Not on file  Social History Narrative  . Not on file   Social Determinants of Health   Financial Resource Strain:   . Difficulty of Paying Living Expenses: Not on file  Food Insecurity:   . Worried About Programme researcher, broadcasting/film/video in the Last Year: Not on file  . Ran Out of Food in the Last Year: Not on file  Transportation Needs:   . Lack of Transportation (Medical): Not on file  . Lack of Transportation (Non-Medical): Not on file  Physical Activity:   . Days of Exercise per Week: Not on file  . Minutes of Exercise per Session: Not on file  Stress:   . Feeling of Stress : Not on file  Social Connections:   . Frequency of Communication with Friends and Family: Not on file  . Frequency of Social Gatherings with Friends and Family: Not on file  . Attends Religious Services: Not on file  . Active Member of Clubs or Organizations: Not on file  . Attends Banker Meetings: Not on file  . Marital Status: Not on file    Allergies:  Allergies  Allergen Reactions  . Abilify [Aripiprazole]     LIGHT SENSITIVITY    Metabolic Disorder Labs: No results found for: HGBA1C, MPG No results found for: PROLACTIN Lab Results  Component Value Date   CHOL 150 05/25/2015   TRIG 75 05/25/2015   HDL 48  05/25/2015   LDLCALC 87 05/25/2015   Lab Results  Component Value Date   TSH 1.866 03/30/2015    Therapeutic Level Labs: No results found for: LITHIUM No results found for: VALPROATE No components found for:  CBMZ  Current Medications: Current Outpatient Medications  Medication Sig Dispense Refill  . cetirizine (ZYRTEC) 10 MG tablet Take by mouth.    . doxepin (SINEQUAN) 10 MG capsule TAKE 1 TO 2 CAPSULES BY  MOUTH AT BEDTIME AS NEEDED  FOR SLEEP 90 capsule 7  . fluticasone (FLONASE) 50 MCG/ACT nasal spray Place 2 sprays into both nostrils daily.    . hydrOXYzine (VISTARIL) 25 MG capsule Take 1-2 capsules (25-50 mg total) by mouth 3  times/day as needed-between meals & bedtime for anxiety (sleep). 180 capsule 1  . ibuprofen (ADVIL,MOTRIN) 600 MG tablet Take 1 tablet (600 mg total) by mouth every 8 (eight) hours as needed. 15 tablet 0  . lamoTRIgine (LAMICTAL) 25 MG tablet TAKE 2 TABLETS BY MOUTH  DAILY . DOSE REDUCTION 180 tablet 3  . medroxyPROGESTERone Acetate 150 MG/ML SUSY Inject 1 mL into the muscle every 3 (three) months.    . metroNIDAZOLE (FLAGYL) 500 MG tablet Take 500 mg by mouth 2 (two) times daily.    . Multiple Vitamins-Minerals (MULTIVITAMIN GUMMIES ADULT) CHEW Chew by mouth.    . naproxen sodium (ALEVE) 220 MG tablet Take by mouth.    . norethindrone (ERRIN) 0.35 MG tablet TAKE 1 TABLET BY MOUTH ONCE DAILY    . PREVIDENT 5000 DRY MOUTH 1.1 % GEL dental gel     . sertraline (ZOLOFT) 100 MG tablet TAKE 2 TABLETS BY MOUTH  DAILY 180 tablet 3   No current facility-administered medications for this visit.     Musculoskeletal: Strength & Muscle Tone: UTA Gait & Station: normal Patient leans: N/A  Psychiatric Specialty Exam: Review of Systems  Constitutional: Positive for fatigue.  Psychiatric/Behavioral: Negative for agitation, behavioral problems, confusion, decreased concentration, dysphoric mood, hallucinations, self-injury, sleep disturbance and suicidal ideas.  The patient is not nervous/anxious and is not hyperactive.   All other systems reviewed and are negative.   There were no vitals taken for this visit.There is no height or weight on file to calculate BMI.  General Appearance: Casual  Eye Contact:  Fair  Speech:  Clear and Coherent  Volume:  Normal  Mood:  Euthymic  Affect:  Congruent  Thought Process:  Goal Directed and Descriptions of Associations: Intact  Orientation:  Full (Time, Place, and Person)  Thought Content: Logical   Suicidal Thoughts:  No  Homicidal Thoughts:  No  Memory:  Immediate;   Fair Recent;   Fair Remote;   Fair  Judgement:  Fair  Insight:  Fair  Psychomotor Activity:  Normal  Concentration:  Concentration: Fair and Attention Span: Fair  Recall:  [employer]Fair  Fund of Knowledge: Fair  Language: Fair  Akathisia:  No  Handed:  Right  AIMS (if indicated): UTA  Assets:  Communication Skills Desire for Improvement Housing Social Support  ADL's:  Intact  Cognition: WNL  Sleep:  Fair   Screenings:   Assessment and Plan: Janice BrideLiana Gray is a 39 year old Caucasian female who has a history of depression, anxiety was evaluated by telemedicine today.  Patient with psychosocial stressors of the pandemic.  Patient however is currently stable except for possible lack of motivation as noted above.  Plan as noted below.  Plan MDD in remission Zoloft 200 mg p.o. daily Lamictal 50 mg p.o. daily-reduced dosage Hydroxyzine 25 to 50 mg p.o. 3 times daily as needed  GAD-stable Zoloft as prescribed  Social anxiety disorder-stable We will monitor closely  Insomnia-improving Doxepin 10 to 20 mg p.o. nightly as needed  Patient does report fatigue-she reports this is chronic.  She was referred for sleep study in the past however she did not follow through with recommendation.  Encouraged patient to give the sleep clinic a call.  Also for her lack of motivation discussed referral to CBT.  Also discussed adding Wellbutrin.  She  is not interested.  Follow-up in clinic in 2 months or sooner if needed.  I have spent atleast 20 minutes non face to face with patient today. More than 50 %  of the time was spent for preparing to see the patient ( e.g., review of test, records ), ordering medications and test ,psychoeducation and supportive psychotherapy and care coordination,as well as documenting clinical information in electronic health record. This note was generated in part or whole with voice recognition software. Voice recognition is usually quite accurate but there are transcription errors that can and very often do occur. I apologize for any typographical errors that were not detected and corrected.        [name], MD 09/02/2020, 2:26 PM

## 2020-10-14 ENCOUNTER — Other Ambulatory Visit: Payer: Self-pay | Admitting: Psychiatry

## 2020-10-14 DIAGNOSIS — F411 Generalized anxiety disorder: Secondary | ICD-10-CM

## 2020-10-14 DIAGNOSIS — F4010 Social phobia, unspecified: Secondary | ICD-10-CM

## 2020-11-02 ENCOUNTER — Telehealth (INDEPENDENT_AMBULATORY_CARE_PROVIDER_SITE_OTHER): Payer: BC Managed Care – PPO | Admitting: Psychiatry

## 2020-11-02 ENCOUNTER — Encounter: Payer: Self-pay | Admitting: Psychiatry

## 2020-11-02 ENCOUNTER — Other Ambulatory Visit: Payer: Self-pay

## 2020-11-02 DIAGNOSIS — F3342 Major depressive disorder, recurrent, in full remission: Secondary | ICD-10-CM | POA: Diagnosis not present

## 2020-11-02 DIAGNOSIS — F411 Generalized anxiety disorder: Secondary | ICD-10-CM

## 2020-11-02 DIAGNOSIS — F4010 Social phobia, unspecified: Secondary | ICD-10-CM | POA: Diagnosis not present

## 2020-11-02 NOTE — Progress Notes (Signed)
Virtual Visit via Telephone Note  I connected with Janice Gray on 11/02/20 at  2:00 PM EST by telephone and verified that I am speaking with the correct person using two identifiers.  Location Provider Location : [facility] Patient Location : Home  Participants: Patient , Provider   I discussed the limitations, risks, security and privacy concerns of performing an evaluation and management service by telephone and the availability of in person appointments. I also discussed with the patient that there may be a patient responsible charge related to this service. The patient expressed understanding and agreed to proceed. I discussed the assessment and treatment plan with the patient. The patient was provided an opportunity to ask questions and all were answered. The patient agreed with the plan and demonstrated an understanding of the instructions.  The patient was advised to call back or seek an in-person evaluation if the symptoms worsen or if the condition fails to improve as anticipated.  BH MD OP Progress Note  11/02/2020 5:04 PM Janice Gray  MRN:  [id number]  Chief Complaint:  Chief Complaint    Follow-up     HPI: Janice Gray is a 39 year old Caucasian female, married, employed, lives in [place], has a history of MDD, social anxiety disorder, GAD was evaluated by phone today.  Patient today reports she is currently doing well with regards to her mood.  Denies any sadness or nervousness.  She does continue to have chronic fatigue however she reports she has been this way all her life and does not think she needs any help with that at this time.  She has gained a few pounds and would like to lose it if possible.  She reports she has been watching her diet and exercising.  She reports sleep as good.  She did not go for the sleep study as recommended last visit.  Patient denies any suicidality, homicidality or perceptual disturbances.  Patient denies any other concerns  today.  Visit Diagnosis:    ICD-10-CM   1. MDD (major depressive disorder), recurrent, in full remission (HCC)  F33.42   2. Social anxiety disorder  F40.10   3. GAD (generalized anxiety disorder)  F41.1     Past Psychiatric History: I have reviewed past psychiatric history from my progress note on 03/19/2018.  Past trials of medications like Abilify-light sensitivity, Seroquel-groggy.  Wellbutrin-increased anxiety, Xanax.  Past Medical History:  Past Medical History:  Diagnosis Date  . Anxiety   . Depression   . Headache    sinus  . History of kidney stones   . Microscopic hematuria   . Motion sickness    cars  . Wears contact lenses     Past Surgical History:  Procedure Laterality Date  . BREAST SURGERY     Implants  . ENDOSCOPIC CONCHA BULLOSA RESECTION Right 11/17/2016   Procedure: ENDOSCOPIC CONCHA BULLOSA RESECTION;  Surgeon: Vernie Murders, MD;  Location: [facility];  Service: ENT;  Laterality: Right;  . MANDIBLE FRACTURE SURGERY    . SEPTOPLASTY N/A 11/17/2016   Procedure: SEPTOPLASTY;  Surgeon: Vernie Murders, MD;  Location[facility];  Service: ENT;  Laterality: N/A;  . TUBAL LIGATION Bilateral 10/26/2018  . TURBINATE REDUCTION Bilateral 11/17/2016   Procedure: PARTIAL INFERIOR TURBINATE REDUCTION;  Surgeon: Vernie Murders, MD;  Location: [facility];  Service: ENT;  Laterality: Bilateral;  . Wisdom Teeth Surgery      Family Psychiatric History: I have reviewed family psychiatric history from my progress note on 03/19/2018.  Family History:  Family History  Problem Relation Age of Onset  . Kidney disease Neg Hx   . Bladder Cancer Neg Hx     Social History: I have reviewed social history from my progress note on 03/19/2018. Social History   Socioeconomic History  . Marital status: Married    Spouse name: franklin  . Number of children: 2  . Years of education: Not on file  . Highest education level: Some college, no degree  Occupational  History    Comment: fulltime  Tobacco Use  . Smoking status: Current Every Day Smoker    Packs/day: 0.50    Years: 15.00    Pack years: 7.50    Types: E-cigarettes, Cigarettes    Start date: 06/28/2000  . Smokeless tobacco: Never Used  Vaping Use  . Vaping Use: Never used  Substance and Sexual Activity  . Alcohol use: Yes    Alcohol/week: 0.0 standard drinks    Comment: special occ./ social  . Drug use: No  . Sexual activity: Yes    Partners: Male    Birth control/protection: None, Surgical, Injection  Other Topics Concern  . Not on file  Social History Narrative  . Not on file   Social Determinants of Health   Financial Resource Strain:   . Difficulty of Paying Living Expenses: Not on file  Food Insecurity:   . Worried About Programme researcher, broadcasting/film/video in the Last Year: Not on file  . Ran Out of Food in the Last Year: Not on file  Transportation Needs:   . Lack of Transportation (Medical): Not on file  . Lack of Transportation (Non-Medical): Not on file  Physical Activity:   . Days of Exercise per Week: Not on file  . Minutes of Exercise per Session: Not on file  Stress:   . Feeling of Stress : Not on file  Social Connections:   . Frequency of Communication with Friends and Family: Not on file  . Frequency of Social Gatherings with Friends and Family: Not on file  . Attends Religious Services: Not on file  . Active Member of Clubs or Organizations: Not on file  . Attends Banker Meetings: Not on file  . Marital Status: Not on file    Allergies:  Allergies  Allergen Reactions  . Abilify [Aripiprazole]     LIGHT SENSITIVITY    Metabolic Disorder Labs: No results found for: HGBA1C, MPG No results found for: PROLACTIN Lab Results  Component Value Date   CHOL 150 05/25/2015   TRIG 75 05/25/2015   HDL 48 05/25/2015   LDLCALC 87 05/25/2015   Lab Results  Component Value Date   TSH 1.866 03/30/2015    Therapeutic Level Labs: No results found for:  LITHIUM No results found for: VALPROATE No components found for:  CBMZ  Current Medications: Current Outpatient Medications  Medication Sig Dispense Refill  . cetirizine (ZYRTEC) 10 MG tablet Take by mouth.    . doxepin (SINEQUAN) 10 MG capsule TAKE 1 TO 2 CAPSULES BY  MOUTH AT BEDTIME AS NEEDED  FOR SLEEP 90 capsule 7  . fluticasone (FLONASE) 50 MCG/ACT nasal spray Place 2 sprays into both nostrils daily.    . hydrOXYzine (VISTARIL) 25 MG capsule Take 1-2 capsules (25-50 mg total) by mouth 3 times/day as needed-between meals & bedtime for anxiety (sleep). 180 capsule 1  . ibuprofen (ADVIL,MOTRIN) 600 MG tablet Take 1 tablet (600 mg total) by mouth every 8 (eight) hours as needed. 15 tablet  0  . lamoTRIgine (LAMICTAL) 25 MG tablet TAKE 2 TABLETS BY MOUTH  DAILY . DOSE REDUCTION 180 tablet 3  . medroxyPROGESTERone Acetate 150 MG/ML SUSY Inject 1 mL into the muscle every 3 (three) months.    . metroNIDAZOLE (FLAGYL) 500 MG tablet Take 500 mg by mouth 2 (two) times daily.    . Multiple Vitamins-Minerals (MULTIVITAMIN GUMMIES ADULT) CHEW Chew by mouth.    . naproxen sodium (ALEVE) 220 MG tablet Take by mouth.    . norethindrone (ERRIN) 0.35 MG tablet TAKE 1 TABLET BY MOUTH ONCE DAILY    . PREVIDENT 5000 DRY MOUTH 1.1 % GEL dental gel     . sertraline (ZOLOFT) 100 MG tablet TAKE 2 TABLETS BY MOUTH  DAILY 180 tablet 3   No current facility-administered medications for this visit.     Musculoskeletal: Strength & Muscle Tone: UTA Gait & Station: UTA Patient leans: N/A  Psychiatric Specialty Exam: Review of Systems  Constitutional: Positive for fatigue.  Psychiatric/Behavioral: Negative for agitation, behavioral problems, confusion, decreased concentration, dysphoric mood, hallucinations, self-injury, sleep disturbance and suicidal ideas. The patient is not nervous/anxious and is not hyperactive.   All other systems reviewed and are negative.   There were no vitals taken for this  visit.There is no height or weight on file to calculate BMI.  General Appearance: UTA  Eye Contact:  UTA  Speech:  Clear and Coherent  Volume:  Normal  Mood:  Euthymic  Affect:  UTA  Thought Process:  Goal Directed and Descriptions of Associations: Intact  Orientation:  Full (Time, Place, and Person)  Thought Content: Logical   Suicidal Thoughts:  No  Homicidal Thoughts:  No  Memory:  Immediate;   Fair Recent;   Fair Remote;   Fair  Judgement:  Fair  Insight:  Fair  Psychomotor Activity:  UTA  Concentration:  Concentration: Good and Attention Span: Fair  Recall:  [employer] of Knowledge: Fair  Language: Fair  Akathisia:  No  Handed:  Right  AIMS (if indicated):UTA  Assets:  Communication Skills Desire for Improvement Housing Social Support Talents/Skills  ADL's:  Intact  Cognition: WNL  Sleep:  Fair   Screenings:   Assessment and Plan: Asuncion Tapscott is a 39 year old Caucasian female who has a history of MDD, anxiety, insomnia was evaluated by phone today.  Patient with psychosocial stressors of the pandemic.  She however is currently stable except for chronic fatigue.  Discussed plan as noted below.  Plan MDD in remission Zoloft 200 mg p.o. daily Lamictal 50 mg p.o. daily-reduced dosage. Hydroxyzine 25 to 50 mg p.o. 3 times daily as needed  GAD-stable Zoloft as prescribed  Social anxiety disorder-stable We will monitor closely  Insomnia-improving Doxepin 10 to 20 mg p.o. nightly as needed  Patient has been noncompliant with referral for sleep study.  She does have chronic fatigue.  Unknown if she does have obstructive sleep apnea which needs to be ruled out. Patient also advised to discuss with primary care provider about referral to nutritionist.  Follow-up in clinic in 3 months or sooner if needed.  I have spent atleast 20 minutes non face to face with patient today. More than 50 % of the time was spent for preparing to see the patient ( e.g., review of  test, records ), ordering medications and test ,psychoeducation and supportive psychotherapy and care coordination,as well as documenting clinical information in electronic health record. This note was generated in part or whole with voice recognition software. Voice  recognition is usually quite accurate but there are transcription errors that can and very often do occur. I apologize for any typographical errors that were not detected and corrected.    \    Jomarie LongsSaramma Bryant Lipps, MD 11/02/2020, 5:04 PM

## 2020-12-26 ENCOUNTER — Other Ambulatory Visit: Payer: BC Managed Care – PPO

## 2021-02-02 ENCOUNTER — Telehealth (INDEPENDENT_AMBULATORY_CARE_PROVIDER_SITE_OTHER): Payer: BC Managed Care – PPO | Admitting: Psychiatry

## 2021-02-02 ENCOUNTER — Encounter: Payer: Self-pay | Admitting: Psychiatry

## 2021-02-02 ENCOUNTER — Other Ambulatory Visit: Payer: Self-pay

## 2021-02-02 DIAGNOSIS — F411 Generalized anxiety disorder: Secondary | ICD-10-CM

## 2021-02-02 DIAGNOSIS — F4010 Social phobia, unspecified: Secondary | ICD-10-CM

## 2021-02-02 DIAGNOSIS — F3342 Major depressive disorder, recurrent, in full remission: Secondary | ICD-10-CM | POA: Diagnosis not present

## 2021-02-02 DIAGNOSIS — F17200 Nicotine dependence, unspecified, uncomplicated: Secondary | ICD-10-CM

## 2021-02-02 MED ORDER — NICOTINE 7 MG/24HR TD PT24: 7.0000 mg | MEDICATED_PATCH | Freq: Every day | TRANSDERMAL | 1 refills | Status: DC

## 2021-02-02 NOTE — Patient Instructions (Signed)
Nicotine skin patches What is this medicine? NICOTINE (NIK [place] teen) helps people stop smoking. The patches replace the nicotine found in cigarettes and help to decrease withdrawal effects. They are most effective when used in combination with a stop-smoking program. This medicine may be used for other purposes; ask your health care provider or pharmacist if you have questions. COMMON BRAND NAME(S): Habitrol, Nicoderm CQ, Nicotrol What should I tell my health care provider before I take this medicine? They need to know if you have any of these conditions:  diabetes  heart disease, angina, irregular heartbeat or previous heart attack  high blood pressure  lung disease, including asthma  overactive thyroid  pheochromocytoma  seizures or a history of seizures  skin problems, like eczema  stomach problems or ulcers  an unusual or allergic reaction to nicotine, adhesives, other medicines, foods, dyes, or preservatives  pregnant or trying to get pregnant  breast-feeding How should I use this medicine? This medicine is for external use only. Follow the directions on the label. Do not cut or trim the patch. After applying the patch, wash your hands. Change the patch every day in the morning, keeping to a regular schedule. When you apply a new patch, use a new area of skin. Wait at least 1 week before using the same area again. This drug comes with INSTRUCTIONS FOR USE. Ask your pharmacist for directions on how to use this drug. Read the information carefully. Talk to your pharmacist or health care provider if you have questions. Talk to your pediatrician regarding the use of this medicine in children. Special care may be needed. Overdosage: If you think you have taken too much of this medicine contact a poison control center or emergency room at once. NOTE: This medicine is only for you. Do not share this medicine with others. What if I miss a dose? If you forget to replace a patch, use  it as soon as you can. Only use one patch at a time and do not leave on the skin for longer than directed. If a patch falls off, you can replace it, but keep to your schedule and remove the patch at the right time. What may interact with this medicine?  certain medicines for lung or breathing disease, like asthma  medicines for blood pressure  medicines for depression This list may not describe all possible interactions. Give your health care provider a list of all the medicines, herbs, non-prescription drugs, or dietary supplements you use. Also tell them if you smoke, drink alcohol, or use illegal drugs. Some items may interact with your medicine. What should I watch for while using this medicine? Visit your health care provider for regular checks on your progress. Talk to your health care provider about what you can do to improve your chances of quitting. If you are going to need surgery, an MRI, CT scan, or other procedure, tell your health care provider that you are using this drug. You may need to remove the patch before the procedure. What side effects may I notice from receiving this medicine? Side effects that you should report to your doctor or health care professional as soon as possible:  allergic reactions like skin rash, itching or hives, swelling of the face, lips, or tongue  breathing problems  changes in hearing  changes in vision  chest pain  cold sweats  confusion  fast, irregular heartbeat  feeling faint or lightheaded, falls  headache  increased saliva  skin redness that lasts   more than 4 days  stomach pain  signs and symptoms of nicotine overdose like nausea; vomiting; dizziness; weakness; and rapid heartbeat Side effects that usually do not require medical attention (report to your doctor or health care professional if they continue or are bothersome):  diarrhea  dry mouth  hiccups  irritability  nervousness or restlessness  trouble  sleeping or vivid dreams This list may not describe all possible side effects. Call your doctor for medical advice about side effects. You may report side effects to FDA at 1-800-FDA-1088. Where should I keep my medicine? This product may have enough nicotine in it to make children and pets sick. Keep it away from children and pets. After using, throw away as directed on the package. Store at room temperature between 20 and 25 degrees C (68 and 77 degrees F). Protect from heat and light. Keep this medicine in the original container until ready to use. Throw away unused medicine after the expiration date. NOTE: This sheet is a summary. It may not cover all possible information. If you have questions about this medicine, talk to your doctor, pharmacist, or health care provider.  2021 Elsevier/Gold Standard (2019-11-21 16:54:39)  

## 2021-02-02 NOTE — Progress Notes (Signed)
Virtual Visit via Video Note  I connected with Janice Gray on 02/02/21 at  2:00 PM EST by a video enabled telemedicine application and verified that I am speaking with the correct person using two identifiers.  Location Provider Location : [facility] Patient Location : Home  Participants: Patient , Provider    I discussed the limitations of evaluation and management by telemedicine and the availability of in person appointments. The patient expressed understanding and agreed to proceed.  I discussed the assessment and treatment plan with the patient. The patient was provided an opportunity to ask questions and all were answered. The patient agreed with the plan and demonstrated an understanding of the instructions.   The patient was advised to call back or seek an in-person evaluation if the symptoms worsen or if the condition fails to improve as anticipated.  [facility] MD OP Progress Note  02/02/2021 9:27 PM Janice Gray  MRN:  [id number]  Chief Complaint:  Chief Complaint    Follow-up     HPI: Janice Gray is a 40 year old Caucasian female, married, employed, lives in [place], has a history of MDD, social anxiety disorder, GAD was evaluated by telemedicine today.  Patient today reports she has been doing well with regards to her mood.  Denies any significant depression or anxiety symptoms.  She reports sleep is overall okay.  She does have hip joint pain which she has been struggling with since the past several years.  It comes and goes.  She reports naproxen over-the-counter helps with her pain.  The pain does affect her sleep at times however overall she has been doing okay.  She takes the doxepin as needed which helps.  Patient reports she is interested in smoking cessation.  Discussed nicotine patches and she is agreeable.  Patient denies any suicidality, homicidality or perceptual disturbances.  Patient denies any other concerns today.    Visit Diagnosis:    ICD-10-CM   1.  MDD (major depressive disorder), recurrent, in full remission (HCC)  F33.42   2. Social anxiety disorder  F40.10   3. GAD (generalized anxiety disorder)  F41.1   4. Tobacco use disorder  F17.200 nicotine (NICODERM CQ - DOSED IN MG/24 HR) 7 mg/24hr patch    Past Psychiatric History: I have reviewed past psychiatric history from my progress note on 03/19/2018.  Past trials of medications like Abilify-light sensitivity, Seroquel-groggy, Wellbutrin-increased anxiety, Xanax.  Past Medical History:  Past Medical History:  Diagnosis Date  . Anxiety   . Depression   . Headache    sinus  . History of kidney stones   . Microscopic hematuria   . Motion sickness    cars  . Wears contact lenses     Past Surgical History:  Procedure Laterality Date  . BREAST SURGERY     Implants  . ENDOSCOPIC CONCHA BULLOSA RESECTION Right 11/17/2016   Procedure: ENDOSCOPIC CONCHA BULLOSA RESECTION;  Surgeon: Vernie Murders, MD;  Location: [facility];  Service: ENT;  Laterality: Right;  . MANDIBLE FRACTURE SURGERY    . SEPTOPLASTY N/A 11/17/2016   Procedure: SEPTOPLASTY;  Surgeon: Vernie Murders, MD;  Location: [facility];  Service: ENT;  Laterality: N/A;  . TUBAL LIGATION Bilateral 10/26/2018  . TURBINATE REDUCTION Bilateral 11/17/2016   Procedure: PARTIAL INFERIOR TURBINATE REDUCTION;  Surgeon: Vernie Murders, MD;  Location: [facility];  Service: ENT;  Laterality: Bilateral;  . Wisdom Teeth Surgery      Family Psychiatric History: I have reviewed family psychiatric history from  my progress note on 03/19/2018.  Family History:  Family History  Problem Relation Age of Onset  . Kidney disease Neg Hx   . Bladder Cancer Neg Hx     Social History: Reviewed social history from my progress note on 03/19/2018. Social History   Socioeconomic History  . Marital status: Married    Spouse name: franklin  . Number of children: 2  . Years of education: Not on file  . Highest education level:  Some college, no degree  Occupational History    Comment: fulltime  Tobacco Use  . Smoking status: Current Every Day Smoker    Packs/day: 0.50    Years: 15.00    Pack years: 7.50    Types: E-cigarettes, Cigarettes    Start date: 06/28/2000  . Smokeless tobacco: Never Used  Vaping Use  . Vaping Use: Never used  Substance and Sexual Activity  . Alcohol use: Yes    Alcohol/week: 0.0 standard drinks    Comment: special occ./ social  . Drug use: No  . Sexual activity: Yes    Partners: Male    Birth control/protection: None, Surgical, Injection  Other Topics Concern  . Not on file  Social History Narrative  . Not on file   Social Determinants of Health   Financial Resource Strain: Not on file  Food Insecurity: Not on file  Transportation Needs: Not on file  Physical Activity: Not on file  Stress: Not on file  Social Connections: Not on file    Allergies:  Allergies  Allergen Reactions  . Abilify [Aripiprazole]     LIGHT SENSITIVITY    Metabolic Disorder Labs: No results found for: HGBA1C, MPG No results found for: PROLACTIN Lab Results  Component Value Date   CHOL 150 05/25/2015   TRIG 75 05/25/2015   HDL 48 05/25/2015   LDLCALC 87 05/25/2015   Lab Results  Component Value Date   TSH 1.866 03/30/2015    Therapeutic Level Labs: No results found for: LITHIUM No results found for: VALPROATE No components found for:  CBMZ  Current Medications: Current Outpatient Medications  Medication Sig Dispense Refill  . nicotine (NICODERM CQ - DOSED IN MG/24 HR) 7 mg/24hr patch Place 1 patch (7 mg total) onto the skin daily. 28 patch 1  . cetirizine (ZYRTEC) 10 MG tablet Take by mouth.    . doxepin (SINEQUAN) 10 MG capsule TAKE 1 TO 2 CAPSULES BY  MOUTH AT BEDTIME AS NEEDED  FOR SLEEP 90 capsule 7  . fluticasone (FLONASE) 50 MCG/ACT nasal spray Place 2 sprays into both nostrils daily.    . hydrOXYzine (VISTARIL) 25 MG capsule Take 1-2 capsules (25-50 mg total) by mouth  3 times/day as needed-between meals & bedtime for anxiety (sleep). 180 capsule 1  . ibuprofen (ADVIL,MOTRIN) 600 MG tablet Take 1 tablet (600 mg total) by mouth every 8 (eight) hours as needed. 15 tablet 0  . lamoTRIgine (LAMICTAL) 25 MG tablet TAKE 2 TABLETS BY MOUTH  DAILY . DOSE REDUCTION 180 tablet 3  . medroxyPROGESTERone Acetate 150 MG/ML SUSY Inject 1 mL into the muscle every 3 (three) months.    . metroNIDAZOLE (FLAGYL) 500 MG tablet Take 500 mg by mouth 2 (two) times daily.    . Multiple Vitamins-Minerals (MULTIVITAMIN GUMMIES ADULT) CHEW Chew by mouth.    . naproxen sodium (ALEVE) 220 MG tablet Take by mouth.    . norethindrone (ERRIN) 0.35 MG tablet TAKE 1 TABLET BY MOUTH ONCE DAILY    . PREVIDENT 5000  DRY MOUTH 1.1 % GEL dental gel     . sertraline (ZOLOFT) 100 MG tablet TAKE 2 TABLETS BY MOUTH  DAILY 180 tablet 3   No current facility-administered medications for this visit.     Musculoskeletal: Strength & Muscle Tone: UTA Gait & Station: UTA Patient leans: N/A  Psychiatric Specialty Exam: Review of Systems  Musculoskeletal:       Hip joint pain - stable  Psychiatric/Behavioral: Negative for agitation, behavioral problems, confusion, decreased concentration, dysphoric mood, hallucinations, self-injury, sleep disturbance and suicidal ideas. The patient is not nervous/anxious and is not hyperactive.   All other systems reviewed and are negative.   There were no vitals taken for this visit.There is no height or weight on file to calculate BMI.  General Appearance: Casual  Eye Contact:  Fair  Speech:  Clear and Coherent  Volume:  Normal  Mood:  Euthymic  Affect:  Congruent  Thought Process:  Goal Directed and Descriptions of Associations: Intact  Orientation:  Full (Time, Place, and Person)  Thought Content: Logical   Suicidal Thoughts:  No  Homicidal Thoughts:  No  Memory:  Immediate;   Fair Recent;   Fair Remote;   Fair  Judgement:  Fair  Insight:  Fair   Psychomotor Activity:  Normal  Concentration:  Concentration: Fair and Attention Span: Fair  Recall:  [employer] of Knowledge: Fair  Language: Fair  Akathisia:  No  Handed:  Right  AIMS (if indicated): UTA  Assets:  Communication Skills Desire for Improvement Housing Intimacy Social Support Talents/Skills Transportation Vocational/Educational  ADL's:  Intact  Cognition: WNL  Sleep:  Fair   Screenings: PHQ2-9   Flowsheet Row Video Visit from [facility]/02/2021 in [facility]  PHQ-2 Total Score 0    Flowsheet Row Video Visit from 02/02/2021 in [facility]  C-SSRS RISK CATEGORY No Risk       Assessment and Plan: Maciel Kegg is a 40 year old Caucasian female who has a history of MDD, anxiety, insomnia was evaluated by telemedicine today.  Patient is currently stable on current medication regimen however is interested in smoking cessation.  Discussed plan as noted below.  Plan MDD in remission Zoloft 200 mg p.o. daily Lamictal 50 mg p.o. daily reduced dosage Continue doxepin 10 to 20 mg p.o. nightly as needed for sleep PHQ 2 today equals 0  GAD-stable Zoloft as prescribed  Social anxiety disorder-stable We will monitor closely  Tobacco use disorder-unstable Provided counseling. Start NicoDerm patch 7 mg per 24-hour.  Follow-up in clinic in 3 months or sooner if needed.  I have spent atleast 20 minutes  face to face by video with patient today. More than 50 % of the time was spent for preparing to see the patient ( e.g., review of test, records ), , ordering medications and test ,psychoeducation and supportive psychotherapy and care coordination,as well as documenting clinical information in electronic health record. This note was generated in part or whole with voice recognition software. Voice recognition is usually quite accurate but there are transcription errors that can and very often do occur. I apologize for  any typographical errors that were not detected and corrected.        Janice Longs, MD 02/02/2021, 9:27 PM

## 2021-02-15 ENCOUNTER — Other Ambulatory Visit: Payer: Self-pay

## 2021-02-15 ENCOUNTER — Ambulatory Visit
Admission: EM | Admit: 2021-02-15 | Discharge: 2021-02-15 | Disposition: A | Discharge status: Home / Self Care | Payer: BC Managed Care – PPO | Attending: Sports Medicine | Admitting: Sports Medicine

## 2021-02-15 ENCOUNTER — Encounter: Payer: Self-pay | Admitting: Emergency Medicine

## 2021-02-15 DIAGNOSIS — M62838 Other muscle spasm: Secondary | ICD-10-CM | POA: Diagnosis present

## 2021-02-15 DIAGNOSIS — M546 Pain in thoracic spine: Secondary | ICD-10-CM

## 2021-02-15 DIAGNOSIS — M545 Low back pain, unspecified: Secondary | ICD-10-CM | POA: Diagnosis not present

## 2021-02-15 LAB — URINALYSIS, COMPLETE (UACMP) WITH MICROSCOPIC
Bilirubin Urine: NEGATIVE
Glucose, UA: NEGATIVE mg/dL
Ketones, ur: NEGATIVE mg/dL
Nitrite: NEGATIVE
Protein, ur: NEGATIVE mg/dL
Specific Gravity, Urine: 1.015 (ref 1.005–1.030)
pH: 7 (ref 5.0–8.0)

## 2021-02-15 MED ORDER — METHOCARBAMOL 500 MG PO TABS: 500.0000 mg | ORAL_TABLET | Freq: Two times a day (BID) | ORAL | 0 refills | Status: DC

## 2021-02-15 MED ORDER — PREDNISONE 10 MG (21) PO TBPK: ORAL_TABLET | Freq: Every day | ORAL | 0 refills | Status: DC

## 2021-02-15 NOTE — ED Triage Notes (Signed)
Patient c/o lower back pain that started 1 week ago. She states she thinks she overworked her back. She reports a burning sensation. Denies urinary symptoms.

## 2021-02-15 NOTE — Discharge Instructions (Addendum)
You have been prescribed a prednisone taper.  Please take as directed.  Stop the ketorolac while taking the prednisone.  I also prescribed Robaxin for muscle spasm. You would benefit from formal physical therapy but will need a referral from your primary care physician's office. Gave you an Audiological scientist. Can take Tylenol as needed but stay away from any other anti-inflammatories. If you develop any incontinence of bowel or bladder or any pain that is not controlled with current medication you need to call 911 or go to the ER.  Otherwise follow-up here as needed.  I hope you get feeling better, Dr. Zachery Dauer

## 2021-02-15 NOTE — ED Provider Notes (Signed)
[facility]    CSN: [id number] Arrival date & time: 02/15/21  1427      History   Chief Complaint No chief complaint on file.   HPI Zanyah Lentsch is a 40 y.o. female.   Patient pleasant 40 year old female who presents for evaluation of the above issue.  She is complaining of some low back pain mostly on the right side.  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  She works at [employer].  She developed pain the next day.  No accidents trauma falls or twists.  This is not a workers comp case.  She did do a virtual visit was put on ketorolac but that has not been helping.  She says it actually started in her mid back but now is going down into her low back mostly on the right side and into the buttock.  It does not go down her leg or arms.  He is getting a little bit of burning and she points over the paraspinous muscles in the thoracic and lumbar region on the right side.  She did have chickenpox as a child but has not noticed a rash and has never had shingles.  She denies any incontinence of bowel or bladder.  She denies any urinary symptoms or GI symptoms.  No chest pain or shortness of breath.  No red flag signs or symptoms elicited on history.     Past Medical History:  Diagnosis Date  . Anxiety   . Depression   . Headache    sinus  . History of kidney stones   . Microscopic hematuria   . Motion sickness    cars  . Wears contact lenses     Patient Active Problem List   Diagnosis Date Noted  . MDD (major depressive disorder), recurrent, in full remission (HCC) 05/21/2020  . MDD (major depressive disorder), recurrent, in partial remission (HCC) 02/25/2020  . GAD (generalized anxiety disorder) 06/06/2019  . Insomnia due to mental condition 06/06/2019  . Chronic seasonal allergic rhinitis due to pollen 08/02/2016  . Major depressive disorder, recurrent episode, moderate (HCC) 03/30/2016  .  Social anxiety disorder 03/30/2016  . Irritable bowel syndrome with constipation 11/24/2015  . Calculus of kidney 11/24/2015  . Anxiety 11/04/2015  . Cigarette smoker 11/04/2015  . Ureteral stone 08/30/2015  . Hydronephrosis with urinary obstruction due to ureteral calculus 08/30/2015  . Microscopic hematuria 08/30/2015  . History of nephrolithiasis 08/30/2015  . 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 Depression, major, recurrent, moderate (HCC) 08/28/2015  . 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 Clinical depression 06/29/2015    Past Surgical History:  Procedure Laterality Date  . BREAST SURGERY     Implants  . ENDOSCOPIC CONCHA BULLOSA RESECTION Right 11/17/2016   Procedure: ENDOSCOPIC CONCHA BULLOSA RESECTION;  Surgeon: Vernie Murders, MD;  Location: [facility];  Service: ENT;  Laterality: Right;  . MANDIBLE FRACTURE SURGERY    . SEPTOPLASTY N/A 11/17/2016   Procedure: SEPTOPLASTY;  Surgeon: Vernie Murders, MD;  Location: [facility];  Service: ENT;  Laterality: N/A;  . TUBAL LIGATION Bilateral 10/26/2018  . TURBINATE REDUCTION Bilateral 11/17/2016   Procedure: PARTIAL INFERIOR TURBINATE REDUCTION;  Surgeon: Vernie Murders, MD;  Location: [facility];  Service: ENT;  Laterality: Bilateral;  . Wisdom Teeth Surgery      OB History   No obstetric history on file.      Home  Medications    Prior to Admission medications   Medication Sig Start Date End Date Taking? Authorizing Provider  cetirizine (ZYRTEC) 10 MG tablet Take by mouth.   Yes [provider]  doxepin (SINEQUAN) 10 MG capsule TAKE 1 TO 2 CAPSULES BY  MOUTH AT BEDTIME AS NEEDED  FOR SLEEP 05/19/20  Yes Eappen, Levin BaconSaramma, MD  fluticasone (FLONASE) 50 MCG/ACT nasal spray Place 2 sprays into both nostrils daily.   Yes [provider]  hydrOXYzine (VISTARIL) 25 MG capsule Take 1-2 capsules (25-50 mg total) by mouth 3 times/day as needed-between meals & bedtime for anxiety (sleep). 05/21/18  Yes Jomarie LongsEappen,  Saramma, MD  ibuprofen (ADVIL,MOTRIN) 600 MG tablet Take 1 tablet (600 mg total) by mouth every 8 (eight) hours as needed. 08/20/15  Yes Irean HongSung, Jade J, MD  lamoTRIgine (LAMICTAL) 25 MG tablet TAKE 2 TABLETS BY MOUTH  DAILY . DOSE REDUCTION 06/17/20  Yes Jomarie LongsEappen, Saramma, MD  medroxyPROGESTERone Acetate 150 MG/ML SUSY Inject 1 mL into the muscle every 3 (three) months. 08/03/20  Yes [provider]  methocarbamol (ROBAXIN) 500 MG tablet Take 1 tablet (500 mg total) by mouth 2 (two) times daily. 02/15/21  Yes Delton SeeBarnes, Armanda Forand, MD  Multiple Vitamins-Minerals (MULTIVITAMIN GUMMIES ADULT) CHEW Chew by mouth.   Yes [provider]  naproxen sodium (ALEVE) 220 MG tablet Take by mouth.   Yes [provider]  predniSONE (STERAPRED UNI-PAK 21 TAB) 10 MG (21) TBPK tablet Take by mouth daily. Take 6 tabs by mouth daily  for 2 days, then 5 tabs for 2 days, then 4 tabs for 2 days, then 3 tabs for 2 days, 2 tabs for 2 days, then 1 tab by mouth daily for 2 days 02/15/21  Yes Delton SeeBarnes, Aaronmichael Brumbaugh, MD  sertraline (ZOLOFT) 100 MG tablet TAKE 2 TABLETS BY MOUTH  DAILY 10/15/20  Yes Jomarie LongsEappen, Saramma, MD  metroNIDAZOLE (FLAGYL) 500 MG tablet Take 500 mg by mouth 2 (two) times daily. 04/26/20   [provider]  nicotine (NICODERM CQ - DOSED IN MG/24 HR) 7 mg/24hr patch Place 1 patch (7 mg total) onto the skin daily. 02/02/21   Jomarie LongsEappen, Saramma, MD  norethindrone (MICRONOR) 0.35 MG tablet TAKE 1 TABLET BY MOUTH ONCE DAILY 09/24/19   [provider]  PREVIDENT 5000 DRY MOUTH 1.1 % GEL dental gel  05/18/19   [provider]    Family History Family History  Problem Relation Age of Onset  . Kidney disease Neg Hx   . Bladder Cancer Neg Hx     Social History Social History   Tobacco Use  . Smoking status: Current Every Day Smoker    Packs/day: 0.50    Years: 15.00    Pack years: 7.50    Types: E-cigarettes, Cigarettes    Start date: 06/28/2000  . Smokeless tobacco: Never Used   Vaping Use  . Vaping Use: Never used  Substance Use Topics  . Alcohol use: Yes    Alcohol/week: 0.0 standard drinks    Comment: special occ./ social  . Drug use: No     Allergies   Abilify [aripiprazole]   Review of Systems Review of Systems  Constitutional: Positive for activity change. Negative for appetite change, chills, diaphoresis, fatigue and fever.  HENT: Negative.   Respiratory: Negative.   Cardiovascular: Negative.   Gastrointestinal: Negative.   Genitourinary: Negative.  Negative for difficulty urinating, dysuria, flank pain, frequency, hematuria and urgency.  Musculoskeletal: Positive for back pain and myalgias. Negative for arthralgias, gait problem,  joint swelling, neck pain and neck stiffness.  Skin: Negative.  Negative for rash.  Neurological: Negative for dizziness, tremors, seizures, syncope, weakness, light-headedness, numbness and headaches.  All other systems reviewed and are negative.    Physical Exam Triage Vital Signs ED Triage Vitals  Enc Vitals Group     BP 02/15/21 1531 108/61     Pulse Rate 02/15/21 1531 75     Resp 02/15/21 1531 18     Temp 02/15/21 1531 98.5 F (36.9 C)     Temp Source 02/15/21 1531 Oral     SpO2 02/15/21 1531 100 %     Weight 02/15/21 1528 160 lb 0.9 oz (72.6 kg)     Height 02/15/21 1528 5\' 6"  (1.676 m)     Head Circumference --      Peak Flow --      Pain Score 02/15/21 1528 7     Pain Loc --      Pain Edu? --      Excl. in GC? --    No data found.  Updated Vital Signs BP 108/61 (BP Location: Left Arm)   Pulse 75   Temp 98.5 F (36.9 C) (Oral)   Resp 18   Ht 5\' 6"  (1.676 m)   Wt 72.6 kg   SpO2 100%   BMI 25.83 kg/m   Visual Acuity Right Eye Distance:   Left Eye Distance:   Bilateral Distance:    Right Eye Near:   Left Eye Near:    Bilateral Near:     Physical Exam Vitals and nursing note reviewed.  Constitutional:      General: She is not in acute distress.    Appearance: Normal appearance.  She is not ill-appearing or toxic-appearing.     Comments: She is uncomfortable at times during the examination.  HENT:     Head: Normocephalic and atraumatic.  Eyes:     Extraocular Movements: Extraocular movements intact.     Pupils: Pupils are equal, round, and reactive to light.  Cardiovascular:     Rate and Rhythm: Normal rate and regular rhythm.     Pulses: Normal pulses.     Heart sounds: Normal heart sounds.  Pulmonary:     Effort: Pulmonary effort is normal.     Breath sounds: Normal breath sounds.  Abdominal:     General: Abdomen is flat.     Palpations: Abdomen is soft.     Tenderness: There is no abdominal tenderness. There is no right CVA tenderness, left CVA tenderness, guarding or rebound.  Musculoskeletal:        General: No swelling, tenderness, deformity or signs of injury.     Cervical back: Normal range of motion and neck supple. No rigidity or tenderness.     Comments: Back exam: No obvious bony abnormality ecchymosis erythema no soft tissue swelling.  She has tenderness to palpation in the paraspinous muscles emanating from the thoracic spine down into the lumbar spine.  There is associated muscle spasm.  She has limitation in range of motion with forward flexion extension lateral side bends and rotation.  This is due to discomfort on the patient's part.  No strength deficits noted.  Sensation is normal.  There is no scoliosis or kyphosis.  Neurovascular: Normal sensation 2+ pulses distally.  Skin:    General: Skin is warm and dry.     Capillary Refill: Capillary refill takes less than 2 seconds.     Findings: No lesion or rash.  Neurological:  General: No focal deficit present.     Mental Status: She is alert and oriented to person, place, and time.      UC Treatments / Results  Labs (all labs ordered are listed, but only abnormal results are displayed) Labs Reviewed  URINALYSIS, COMPLETE (UACMP) WITH MICROSCOPIC - Abnormal; Notable for the following  components:      Result Value   Hgb urine dipstick SMALL (*)    Leukocytes,Ua TRACE (*)    Bacteria, UA FEW (*)    All other components within normal limits    EKG   Radiology No results found.  Procedures Procedures (including critical care time)  Medications Ordered in UC Medications - No data to display  Initial Impression / Assessment and Plan / UC Course  I have reviewed the triage vital signs and the nursing notes.  Pertinent labs & imaging results that were available during my care of the patient were reviewed by me and considered in my medical decision making (see chart for details).  Clinical impression:  1 week of thoracic and low back pain going down the right side.  No evidence of any lumbar radiculopathy.  No rash or evidence of shingles.  It occurred after she had a change in work duties for 1 day.  There was no accidents trauma or falls.  It is not a workers comp case.  Ketorolac is not helping her.  Treatment plan: 1.  The findings and treatment plan were discussed in detail with the patient.  Patient was in agreement. 2. We will put her on a Sterapred Dosepak and she will stop the ketorolac. 3.  She was also prescribed Robaxin for muscle spasm. 4.  She would certainly benefit from some formal physical therapy if the steroids do not help her.  I would leave it to her primary care physician to refer her if that is needed. 5.  I have asked her to schedule follow-up appointment with her PCP for this.  The staff obtained a UA upon arrival which seems to be a contaminant with trace leukocytes and a few bacteria.  If her symptoms persist her PCP should order another UA and send it out for culture but for now I will choose not to treat as she is not having any symptoms and her exam is reassuring from that perspective. 6.  Educational handout was provided. 7.  Given that she does on steroids I want her to stay away from any other NSAIDs she can use Tylenol as needed. 8.  I  offered her a work note but she deferred. 9.  She will follow-up with [place] as needed.    Final Clinical Impressions(s) / UC Diagnoses   Final diagnoses:  Acute right-sided low back pain without sciatica  Acute right-sided thoracic back pain  Muscle spasm     Discharge Instructions     You have been prescribed a prednisone taper.  Please take as directed.  Stop the ketorolac while taking the prednisone.  I also prescribed Robaxin for muscle spasm. You would benefit from formal physical therapy but will need a referral from your primary care physician's office. Gave you an Audiological scientist. Can take Tylenol as needed but stay away from any other anti-inflammatories. If you develop any incontinence of bowel or bladder or any pain that is not controlled with current medication you need to call 911 or go to the ER.  Otherwise follow-up here as needed.  I hope you get feeling better, Dr. Zachery Dauer  ED Prescriptions    Medication Sig Dispense Auth. Provider   predniSONE (STERAPRED UNI-PAK 21 TAB) 10 MG (21) TBPK tablet Take by mouth daily. Take 6 tabs by mouth daily  for 2 days, then 5 tabs for 2 days, then 4 tabs for 2 days, then 3 tabs for 2 days, 2 tabs for 2 days, then 1 tab by mouth daily for 2 days 42 tablet Delton See, MD   methocarbamol (ROBAXIN) 500 MG tablet Take 1 tablet (500 mg total) by mouth 2 (two) times daily. 20 tablet Delton See, MD     PDMP not reviewed this encounter.   Delton See, MD 02/17/21 [phone]

## 2021-02-16 ENCOUNTER — Ambulatory Visit: Payer: Self-pay

## 2021-05-04 ENCOUNTER — Telehealth: Payer: BC Managed Care – PPO | Admitting: Psychiatry

## 2021-05-13 ENCOUNTER — Encounter: Payer: Self-pay | Admitting: Psychiatry

## 2021-05-13 ENCOUNTER — Telehealth (INDEPENDENT_AMBULATORY_CARE_PROVIDER_SITE_OTHER): Payer: BC Managed Care – PPO | Admitting: Psychiatry

## 2021-05-13 ENCOUNTER — Other Ambulatory Visit: Payer: Self-pay

## 2021-05-13 DIAGNOSIS — F3342 Major depressive disorder, recurrent, in full remission: Secondary | ICD-10-CM | POA: Diagnosis not present

## 2021-05-13 DIAGNOSIS — F4010 Social phobia, unspecified: Secondary | ICD-10-CM | POA: Diagnosis not present

## 2021-05-13 DIAGNOSIS — F17200 Nicotine dependence, unspecified, uncomplicated: Secondary | ICD-10-CM | POA: Diagnosis not present

## 2021-05-13 DIAGNOSIS — F411 Generalized anxiety disorder: Secondary | ICD-10-CM

## 2021-05-13 MED ORDER — SERTRALINE HCL 100 MG PO TABS: 100.0000 mg | ORAL_TABLET | Freq: Every day | ORAL | 0 refills | Status: DC

## 2021-05-13 NOTE — Progress Notes (Signed)
Virtual Visit via Video Note  I connected with Janice Gray on 05/13/21 at  3:30 PM EDT by a video enabled telemedicine application and verified that I am speaking with the correct person using two identifiers.  Location Provider Location : [facility] Patient Location : Home  Participants: Patient , Provider   I discussed the limitations of evaluation and management by telemedicine and the availability of in person appointments. The patient expressed understanding and agreed to proceed.   I discussed the assessment and treatment plan with the patient. The patient was provided an opportunity to ask questions and all were answered. The patient agreed with the plan and demonstrated an understanding of the instructions.   The patient was advised to call back or seek an in-person evaluation if the symptoms worsen or if the condition fails to improve as anticipated.   [facility] MD OP Progress Note  05/13/2021 3:50 PM Janice Gray  MRN:  [id number]  Chief Complaint:  Chief Complaint    Follow-up; Anxiety     HPI: Janice Gray is a 40 year old Caucasian female, married, employed, lives in [place], has a history of MDD, social anxiety disorder, GAD was evaluated by telemedicine today.  Patient today reports she is currently doing well.  Reports mood as stable.  Denies any mood swings.  She reports sleep as fair.  She reports appetite is fair.  She reports work continues to be busy.  She is doing well with work.  She denies any problems with her medications.  She is compliant on them.  She reports she is interested in reducing the dosage of Zoloft since her symptoms have been stable.  Patient also reports the current dosage of her NicoDerm patch as not effective.  She wonders whether a higher dosage will help her better.  She is interested in smoking cessation.  Patient denies any other concerns today.   Visit Diagnosis:    ICD-10-CM   1. MDD (major depressive disorder), recurrent, in full  remission (HCC)  F33.42 sertraline (ZOLOFT) 100 MG tablet  2. Social anxiety disorder  F40.10 sertraline (ZOLOFT) 100 MG tablet  3. GAD (generalized anxiety disorder)  F41.1 sertraline (ZOLOFT) 100 MG tablet  4. Tobacco use disorder  F17.200 nicotine (NICODERM CQ) 14 mg/24hr patch    Past Psychiatric History: I have reviewed past psychiatric history from progress note on 03/19/2018.  Past trials of medications like Abilify-light sensitivity, Seroquel-groggy,Wellbutrin-increased anxiety, Xanax  Past Medical History:  Past Medical History:  Diagnosis Date  . Anxiety   . Depression   . Headache    sinus  . History of kidney stones   . Microscopic hematuria   . Motion sickness    cars  . Wears contact lenses     Past Surgical History:  Procedure Laterality Date  . BREAST SURGERY     Implants  . ENDOSCOPIC CONCHA BULLOSA RESECTION Right 11/17/2016   Procedure: ENDOSCOPIC CONCHA BULLOSA RESECTION;  Surgeon: Vernie Murders, MD;  Location: [facility];  Service: ENT;  Laterality: Right;  . MANDIBLE FRACTURE SURGERY    . SEPTOPLASTY N/A 11/17/2016   Procedure: SEPTOPLASTY;  Surgeon: Vernie Murders, MD;  Location: [facility];  Service: ENT;  Laterality: N/A;  . TUBAL LIGATION Bilateral 10/26/2018  . TURBINATE REDUCTION Bilateral 11/17/2016   Procedure: PARTIAL INFERIOR TURBINATE REDUCTION;  Surgeon: Vernie Murders, MD;  Location: [facility];  Service: ENT;  Laterality: Bilateral;  . Wisdom Teeth Surgery      Family Psychiatric History: I have reviewed  family psychiatric history from progress note on 03/19/2018  Family History:  Family History  Problem Relation Age of Onset  . Kidney disease Neg Hx   . Bladder Cancer Neg Hx     Social History: Reviewed social history from progress note on 03/19/2018 Social History   Socioeconomic History  . Marital status: Married    Spouse name: franklin  . Number of children: 2  . Years of education: Not on file  . Highest  education level: Some college, no degree  Occupational History    Comment: fulltime  Tobacco Use  . Smoking status: Current Every Day Smoker    Packs/day: 0.50    Years: 15.00    Pack years: 7.50    Types: E-cigarettes, Cigarettes    Start date: 06/28/2000  . Smokeless tobacco: Never Used  Vaping Use  . Vaping Use: Never used  Substance and Sexual Activity  . Alcohol use: Yes    Alcohol/week: 0.0 standard drinks    Comment: special occ./ social  . Drug use: No  . Sexual activity: Yes    Partners: Male    Birth control/protection: None, Surgical, Injection  Other Topics Concern  . Not on file  Social History Narrative  . Not on file   Social Determinants of Health   Financial Resource Strain: Not on file  Food Insecurity: Not on file  Transportation Needs: Not on file  Physical Activity: Not on file  Stress: Not on file  Social Connections: Not on file    Allergies:  Allergies  Allergen Reactions  . Abilify [Aripiprazole]     LIGHT SENSITIVITY    Metabolic Disorder Labs: No results found for: HGBA1C, MPG No results found for: PROLACTIN Lab Results  Component Value Date   CHOL 150 05/25/2015   TRIG 75 05/25/2015   HDL 48 05/25/2015   LDLCALC 87 05/25/2015   Lab Results  Component Value Date   TSH 1.866 03/30/2015    Therapeutic Level Labs: No results found for: LITHIUM No results found for: VALPROATE No components found for:  CBMZ  Current Medications: Current Outpatient Medications  Medication Sig Dispense Refill  . nicotine (NICODERM CQ) 14 mg/24hr patch Place 1 patch (14 mg total) onto the skin daily. 28 patch 2  . sertraline (ZOLOFT) 100 MG tablet Take 1 tablet (100 mg total) by mouth daily. 90 tablet 0  . cetirizine (ZYRTEC) 10 MG tablet Take by mouth.    . doxepin (SINEQUAN) 10 MG capsule TAKE 1 TO 2 CAPSULES BY  MOUTH AT BEDTIME AS NEEDED  FOR SLEEP 90 capsule 7  . fluconazole (DIFLUCAN) 150 MG tablet Take 150 mg by mouth once.    .  fluticasone (FLONASE) 50 MCG/ACT nasal spray Place 2 sprays into both nostrils daily.    . hydrOXYzine (VISTARIL) 25 MG capsule Take 1-2 capsules (25-50 mg total) by mouth 3 times/day as needed-between meals & bedtime for anxiety (sleep). 180 capsule 1  . ibuprofen (ADVIL,MOTRIN) 600 MG tablet Take 1 tablet (600 mg total) by mouth every 8 (eight) hours as needed. 15 tablet 0  . ketorolac (TORADOL) 10 MG tablet SMARTSIG:1 Tablet(s) By Mouth Every 12 Hours PRN    . lamoTRIgine (LAMICTAL) 25 MG tablet TAKE 2 TABLETS BY MOUTH  DAILY . DOSE REDUCTION 180 tablet 3  . medroxyPROGESTERone Acetate 150 MG/ML SUSY Inject 1 mL into the muscle every 3 (three) months.    . methocarbamol (ROBAXIN) 500 MG tablet Take 1 tablet (500 mg total) by mouth 2 (  two) times daily. 20 tablet 0  . metroNIDAZOLE (FLAGYL) 500 MG tablet Take 500 mg by mouth 2 (two) times daily.    . Multiple Vitamins-Minerals (MULTIVITAMIN GUMMIES ADULT) CHEW Chew by mouth.    . naproxen sodium (ALEVE) 220 MG tablet Take by mouth.    . norethindrone (MICRONOR) 0.35 MG tablet TAKE 1 TABLET BY MOUTH ONCE DAILY    . predniSONE (STERAPRED UNI-PAK 21 TAB) 10 MG (21) TBPK tablet Take by mouth daily. Take 6 tabs by mouth daily  for 2 days, then 5 tabs for 2 days, then 4 tabs for 2 days, then 3 tabs for 2 days, 2 tabs for 2 days, then 1 tab by mouth daily for 2 days 42 tablet 0  . PREVIDENT 5000 DRY MOUTH 1.1 % GEL dental gel      No current facility-administered medications for this visit.     Musculoskeletal: Strength & Muscle Tone: UTA Gait & Station: normal Patient leans: N/A  Psychiatric Specialty Exam: Review of Systems  Psychiatric/Behavioral: Negative for agitation, behavioral problems, confusion, decreased concentration, dysphoric mood, hallucinations, self-injury, sleep disturbance and suicidal ideas. The patient is not nervous/anxious and is not hyperactive.   All other systems reviewed and are negative.   There were no vitals taken  for this visit.There is no height or weight on file to calculate BMI.  General Appearance: Casual  Eye Contact:  Fair  Speech:  Clear and Coherent  Volume:  Normal  Mood:  Euthymic  Affect:  Congruent  Thought Process:  Goal Directed and Descriptions of Associations: Intact  Orientation:  Full (Time, Place, and Person)  Thought Content: Logical   Suicidal Thoughts:  No  Homicidal Thoughts:  No  Memory:  Immediate;   Fair Recent;   Fair Remote;   Fair  Judgement:  Fair  Insight:  Fair  Psychomotor Activity:  Normal  Concentration:  Concentration: Fair and Attention Span: Fair  Recall:  [employer] of Knowledge: Fair  Language: Fair  Akathisia:  No  Handed:  Right  AIMS (if indicated): UTA  Assets:  Communication Skills Desire for Improvement Housing Social Support  ADL's:  Intact  Cognition: WNL  Sleep:  Fair   Screenings: PHQ2-9   Flowsheet Row Video Visit from 05/13/2021 in [facility] Video Visit from 02/02/2021 in [facility]  PHQ-2 Total Score 0 0    Flowsheet Row ED from 02/15/2021 in [facility]  Video Visit from 02/02/2021 in [facility]  C-SSRS RISK CATEGORY No Risk No Risk       Assessment and Plan: Janice Gray is a 40 year old Caucasian female who has a history of MDD, anxiety, insomnia was evaluated by telemedicine today.  Patient is currently stable.  Plan as noted below.  Plan MDD in remission We will reduce Zoloft to 100 mg p.o. daily Lamictal 50 mg p.o. daily at reduced dosage Doxepin 10 to 20 mg p.o. nightly as needed  GAD-stable Zoloft reduced to 100 mg p.o. daily  Social anxiety disorder-stable Monitor closely  Tobacco use disorder- improving Provided counseling Increased NicoDerm patch to 14 mg.  Follow-up in clinic in 3 months or sooner if needed.  This note was generated in part or whole with voice recognition software. Voice  recognition is usually quite accurate but there are transcription errors that can and very often do occur. I apologize for any typographical errors that were not detected and corrected.  Janice Longs, MD 05/14/2021, 11:40 AM

## 2021-05-14 MED ORDER — NICOTINE 14 MG/24HR TD PT24: 14.0000 mg | MEDICATED_PATCH | Freq: Every day | TRANSDERMAL | 2 refills | Status: DC

## 2021-06-28 ENCOUNTER — Other Ambulatory Visit: Payer: Self-pay

## 2021-06-28 ENCOUNTER — Telehealth (INDEPENDENT_AMBULATORY_CARE_PROVIDER_SITE_OTHER): Payer: BC Managed Care – PPO | Admitting: Psychiatry

## 2021-06-28 ENCOUNTER — Encounter: Payer: Self-pay | Admitting: Psychiatry

## 2021-06-28 DIAGNOSIS — F411 Generalized anxiety disorder: Secondary | ICD-10-CM

## 2021-06-28 DIAGNOSIS — F3342 Major depressive disorder, recurrent, in full remission: Secondary | ICD-10-CM | POA: Diagnosis not present

## 2021-06-28 DIAGNOSIS — F4010 Social phobia, unspecified: Secondary | ICD-10-CM

## 2021-06-28 DIAGNOSIS — F17200 Nicotine dependence, unspecified, uncomplicated: Secondary | ICD-10-CM

## 2021-06-28 MED ORDER — SERTRALINE HCL 100 MG PO TABS: 50.0000 mg | ORAL_TABLET | Freq: Every day | ORAL | 0 refills | Status: DC

## 2021-06-28 NOTE — Progress Notes (Signed)
Virtual Visit via Video Note  I connected with Janice Gray on 06/28/21 at  4:20 PM EDT by a video enabled telemedicine application and verified that I am speaking with the correct person using two identifiers.  Location Provider Location : [facility] Patient Location : Home  Participants: Patient , Provider    I discussed the limitations of evaluation and management by telemedicine and the availability of in person appointments. The patient expressed understanding and agreed to proceed.   I discussed the assessment and treatment plan with the patient. The patient was provided an opportunity to ask questions and all were answered. The patient agreed with the plan and demonstrated an understanding of the instructions.   The patient was advised to call back or seek an in-person evaluation if the symptoms worsen or if the condition fails to improve as anticipated.                                                                 BH MD OP Progress Note  06/28/2021 4:42 PM Janice Gray Gray  MRN:  [id number]  Chief Complaint:  Chief Complaint   Follow-up; Anxiety; Depression    HPI: Janice Gray is a 40 year old Caucasian female, married, employed, lives in [place], has a history of MDD, social anxiety disorder, GAD was evaluated by telemedicine today.   Patient today reports she is doing well with regards to her mood.  She is tolerating the lower dosage of sertraline.  Denies any significant anxiety, sadness.  She reports she is able to function at work.  Reports sleep is good.  She rarely takes the doxepin as needed for sleep.  She continues to smoke cigarettes and reports the nicotine patch did not help.  She is interested in smoking cessation.  Patient denies any suicidality, homicidality or perceptual disturbances.  Patient denies any other concerns today.  Visit Diagnosis:    ICD-10-CM   1. MDD (major depressive disorder), recurrent, in full remission (HCC)  F33.42 sertraline  (ZOLOFT) 100 MG tablet    2. Social anxiety disorder  F40.10 sertraline (ZOLOFT) 100 MG tablet    3. GAD (generalized anxiety disorder)  F41.1 sertraline (ZOLOFT) 100 MG tablet    4. Tobacco use disorder  F17.200       Past Psychiatric History: I have reviewed past psychiatric history from progress note on 03/19/2018.  Past trials of medications like Abilify-light sensitivity, Seroquel-groggy, Wellbutrin-increased anxiety, Xanax  Past Medical History:  Past Medical History:  Diagnosis Date   Anxiety    Depression    Headache    sinus   History of kidney stones    Microscopic hematuria    Motion sickness    cars   Wears contact lenses     Past Surgical History:  Procedure Laterality Date   BREAST SURGERY     Implants   ENDOSCOPIC CONCHA BULLOSA RESECTION Right 11/17/2016   Procedure: ENDOSCOPIC CONCHA BULLOSA RESECTION;  Surgeon: Vernie MurdersPaul Juengel, MD;  [facility];  Service: ENT;  Laterality: Right;   MANDIBLE FRACTURE SURGERY     SEPTOPLASTY N/A 11/17/2016   Procedure: SEPTOPLASTY;  Surgeon: Vernie MurdersPaul Juengel, MD;  [facility];  Service: ENT;  Laterality: N/A;   TUBAL LIGATION Bilateral 10/26/2018   TURBINATE REDUCTION Bilateral 11/17/2016  Procedure: PARTIAL INFERIOR TURBINATE REDUCTION;  Surgeon: Vernie Murders, MD;  Location: [facility];  Service: ENT;  Laterality: Bilateral;   Wisdom Teeth Surgery      Family Psychiatric History: I have reviewed family psychiatric history from progress note on 03/19/2018  Family History:  Family History  Problem Relation Age of Onset   Kidney disease Neg Hx    Bladder Cancer Neg Hx     Social History: Reviewed social history from progress note on 03/19/2018 Social History   Socioeconomic History   Marital status: Married    Spouse name: franklin   Number of children: 2   Years of education: Not on file   Highest education level: Some college, no degree  Occupational History    Comment:  fulltime  Tobacco Use   Smoking status: Every Day    Packs/day: 0.50    Years: 15.00    Pack years: 7.50    Types: E-cigarettes, Cigarettes    Start date: 06/28/2000   Smokeless tobacco: Never  Vaping Use   Vaping Use: Never used  Substance and Sexual Activity   Alcohol use: Yes    Alcohol/week: 0.0 standard drinks    Comment: special occ./ social   Drug use: No   Sexual activity: Yes    Partners: Male    Birth control/protection: None, Surgical, Injection  Other Topics Concern   Not on file  Social History Narrative   Not on file   Social Determinants of Health   Financial Resource Strain: Not on file  Food Insecurity: Not on file  Transportation Needs: Not on file  Physical Activity: Not on file  Stress: Not on file  Social Connections: Not on file    Allergies:  Allergies  Allergen Reactions   Abilify [Aripiprazole]     LIGHT SENSITIVITY    Metabolic Disorder Labs: No results found for: HGBA1C, MPG No results found for: PROLACTIN Lab Results  Component Value Date   CHOL 150 05/25/2015   TRIG 75 05/25/2015   HDL 48 05/25/2015   LDLCALC 87 05/25/2015   Lab Results  Component Value Date   TSH 1.866 03/30/2015    Therapeutic Level Labs: No results found for: LITHIUM No results found for: VALPROATE No components found for:  CBMZ  Current Medications: Current Outpatient Medications  Medication Sig Dispense Refill   cetirizine (ZYRTEC) 10 MG tablet Take by mouth.     doxepin (SINEQUAN) 10 MG capsule TAKE 1 TO 2 CAPSULES BY  MOUTH AT BEDTIME AS NEEDED  FOR SLEEP 90 capsule 7   fluconazole (DIFLUCAN) 150 MG tablet Take 150 mg by mouth once.     fluticasone (FLONASE) 50 MCG/ACT nasal spray Place 2 sprays into both nostrils daily.     hydrOXYzine (VISTARIL) 25 MG capsule Take 1-2 capsules (25-50 mg total) by mouth 3 times/day as needed-between meals & bedtime for anxiety (sleep). 180 capsule 1   ibuprofen (ADVIL,MOTRIN) 600 MG tablet Take 1 tablet (600 mg  total) by mouth every 8 (eight) hours as needed. 15 tablet 0   ketorolac (TORADOL) 10 MG tablet SMARTSIG:1 Tablet(s) By Mouth Every 12 Hours PRN     lamoTRIgine (LAMICTAL) 25 MG tablet TAKE 2 TABLETS BY MOUTH  DAILY . DOSE REDUCTION 180 tablet 3   medroxyPROGESTERone Acetate 150 MG/ML SUSY Inject 1 mL into the muscle every 3 (three) months.     methocarbamol (ROBAXIN) 500 MG tablet Take 1 tablet (500 mg total) by mouth 2 (two) times daily. 20 tablet 0  metroNIDAZOLE (FLAGYL) 500 MG tablet Take 500 mg by mouth 2 (two) times daily.     Multiple Vitamins-Minerals (MULTIVITAMIN GUMMIES ADULT) CHEW Chew by mouth.     naproxen sodium (ALEVE) 220 MG tablet Take by mouth.     nicotine (NICODERM CQ) 14 mg/24hr patch Place 1 patch (14 mg total) onto the skin daily. 28 patch 2   norethindrone (MICRONOR) 0.35 MG tablet TAKE 1 TABLET BY MOUTH ONCE DAILY     predniSONE (STERAPRED UNI-PAK 21 TAB) 10 MG (21) TBPK tablet Take by mouth daily. Take 6 tabs by mouth daily  for 2 days, then 5 tabs for 2 days, then 4 tabs for 2 days, then 3 tabs for 2 days, 2 tabs for 2 days, then 1 tab by mouth daily for 2 days 42 tablet 0   PREVIDENT 5000 DRY MOUTH 1.1 % GEL dental gel      sertraline (ZOLOFT) 100 MG tablet Take 0.5 tablets (50 mg total) by mouth daily. 45 tablet 0   No current facility-administered medications for this visit.     Musculoskeletal: Strength & Muscle Tone:  UTA Gait & Station:  UTA Patient leans: N/A  Psychiatric Specialty Exam: Review of Systems  Psychiatric/Behavioral:  Negative for agitation, behavioral problems, confusion, decreased concentration, dysphoric mood, hallucinations, self-injury, sleep disturbance and suicidal ideas. The patient is not nervous/anxious and is not hyperactive.   All other systems reviewed and are negative.  There were no vitals taken for this visit.There is no height or weight on file to calculate BMI.  General Appearance: Casual  Eye Contact:  Fair  Speech:   Clear and Coherent  Volume:  Normal  Mood:  Euthymic  Affect:  Congruent  Thought Process:  Goal Directed and Descriptions of Associations: Intact  Orientation:  Full (Time, Place, and Person)  Thought Content: Logical   Suicidal Thoughts:  No  Homicidal Thoughts:  No  Memory:  Immediate;   Fair Recent;   Fair Remote;   Fair  Judgement:  Fair  Insight:  Fair  Psychomotor Activity:  Normal  Concentration:  Concentration: Fair and Attention Span: Fair  Recall:  [employer] of Knowledge: Fair  Language: Fair  Akathisia:  No  Handed:  Right  AIMS (if indicated): not done  Assets:  Communication Skills Desire for Improvement Housing Social Support  ADL's:  Intact  Cognition: WNL  Sleep:  Fair   Screenings: PHQ2-9    Flowsheet Row Video Visit from 06/28/2021 in [facility] Video Visit from 05/13/2021 in [facility] Video Visit from 02/02/2021 in [facility]  PHQ-2 Total Score 0 0 0      Flowsheet Row Video Visit from 06/28/2021 in [facility] ED from 02/15/2021 in [facility]  Video Visit from 02/02/2021 in [facility]  C-SSRS RISK CATEGORY No Risk No Risk No Risk        Assessment and Plan: Janice Gray is a 40 year old Caucasian female who has a history of MDD, anxiety, insomnia was evaluated by telemedicine today.  Patient is currently stable and is interested in being tapered off of the Zoloft further.  Discussed plan as noted below.  Plan MDD in remission We will reduce Zoloft to 50 mg p.o. daily Lamictal 50 mg p.o. daily at reduced dosage. Doxepin 10 to 20 mg p.o. nightly as needed-she rarely uses it.  GAD-stable Zoloft as prescribed  Social anxiety disorder-stable Monitor closely  Tobacco  use disorder-unstable Provided information about Chantix.  She will reach out to writer if she wants to try  it. Continue NicoDerm patch 14 mg p.o. daily  Follow-up in clinic in 3 months or sooner if needed.  This note was generated in part or whole with voice recognition software. Voice recognition is usually quite accurate but there are transcription errors that can and very often do occur. I apologize for any typographical errors that were not detected and corrected.      [name], MD 06/29/2021, 8:27 AM

## 2021-07-24 ENCOUNTER — Other Ambulatory Visit: Payer: Self-pay | Admitting: Psychiatry

## 2021-07-24 DIAGNOSIS — F3342 Major depressive disorder, recurrent, in full remission: Secondary | ICD-10-CM

## 2021-09-27 ENCOUNTER — Other Ambulatory Visit: Payer: Self-pay

## 2021-09-27 ENCOUNTER — Encounter: Payer: Self-pay | Admitting: Psychiatry

## 2021-09-27 ENCOUNTER — Telehealth (INDEPENDENT_AMBULATORY_CARE_PROVIDER_SITE_OTHER): Payer: BC Managed Care – PPO | Admitting: Psychiatry

## 2021-09-27 DIAGNOSIS — F3342 Major depressive disorder, recurrent, in full remission: Secondary | ICD-10-CM

## 2021-09-27 DIAGNOSIS — F4010 Social phobia, unspecified: Secondary | ICD-10-CM | POA: Diagnosis not present

## 2021-09-27 DIAGNOSIS — F17200 Nicotine dependence, unspecified, uncomplicated: Secondary | ICD-10-CM | POA: Diagnosis not present

## 2021-09-27 DIAGNOSIS — F411 Generalized anxiety disorder: Secondary | ICD-10-CM | POA: Diagnosis not present

## 2021-09-27 MED ORDER — SERTRALINE HCL 50 MG PO TABS: 50.0000 mg | ORAL_TABLET | Freq: Every day | ORAL | 0 refills | Status: DC

## 2021-09-27 NOTE — Progress Notes (Signed)
Virtual Visit via Video Note  I connected with Janice Gray on 09/27/21 at  4:00 PM EDT by a video enabled telemedicine application and verified that I am speaking with the correct person using two identifiers. Location Provider Location : [facility] Patient Location : Home  Participants: Patient , Provider    I discussed the limitations of evaluation and management by telemedicine and the availability of in person appointments. The patient expressed understanding and agreed to proceed.    I discussed the assessment and treatment plan with the patient. The patient was provided an opportunity to ask questions and all were answered. The patient agreed with the plan and demonstrated an understanding of the instructions.   The patient was advised to call back or seek an in-person evaluation if the symptoms worsen or if the condition fails to improve as anticipated.  BH MD OP Progress Note  09/27/2021 4:07 PM Janice Gray  MRN:  [id number]  Chief Complaint:  Chief Complaint   Follow-up; Depression; Anxiety    HPI: Janice Gray is a 40 year old Caucasian female, married, employed, lives in [place], has a history of MDD, social anxiety disorder, GAD was evaluated by telemedicine today.  Patient today reports she is currently doing well with regards to her mood.  Patient denies any sadness, anxiety or crying spells.  Patient denies any suicidality, homicidality or perceptual disturbances.  Patient reports sleep continues to be good.  She does not take doxepin or hydroxyzine much.  She has been compliant on the sertraline and Lamictal and tolerating the lower dosage well.  She continues to be interested in smoking cessation, currently uses nicotine patch.  She has been able to cut back to half pack cigarettes per day.  Patient denies any other concerns today.  Visit Diagnosis:    ICD-10-CM   1. MDD (major depressive disorder), recurrent, in full remission (HCC)  F33.42 sertraline  (ZOLOFT) 50 MG tablet    2. Social anxiety disorder  F40.10 sertraline (ZOLOFT) 50 MG tablet    3. GAD (generalized anxiety disorder)  F41.1 sertraline (ZOLOFT) 50 MG tablet    4. Tobacco use disorder  F17.200       Past Psychiatric History: Reviewed past psychiatric history from progress note on 03/19/2018.  Past trials of medications like Abilify-light sensitivity, Seroquel-groggy Wellbutrin-increased anxiety, and Xanax.  Past Medical History:  Past Medical History:  Diagnosis Date   Anxiety    Depression    Headache    sinus   History of kidney stones    Microscopic hematuria    Motion sickness    cars   Wears contact lenses     Past Surgical History:  Procedure Laterality Date   BREAST SURGERY     Implants   ENDOSCOPIC CONCHA BULLOSA RESECTION Right 11/17/2016   Procedure: ENDOSCOPIC CONCHA BULLOSA RESECTION;  Surgeon: Vernie Murders, MD;  Location: [facility];  Service: ENT;  Laterality: Right;   MANDIBLE FRACTURE SURGERY     SEPTOPLASTY N/A 11/17/2016   Procedure: SEPTOPLASTY;  Surgeon: Vernie Murders, MD;  Location: [facility];  Service: ENT;  Laterality: N/A;   TUBAL LIGATION Bilateral 10/26/2018   TURBINATE REDUCTION Bilateral 11/17/2016   Procedure: PARTIAL INFERIOR TURBINATE REDUCTION;  Surgeon: Vernie Murders, MD;  Location: [facility];  Service: ENT;  Laterality: Bilateral;   Wisdom Teeth Surgery      Family Psychiatric History: Reviewed family psychiatric history from progress note on 03/19/2018  Family History:  Family History  Problem Relation Age of  Onset   Kidney disease Neg Hx    Bladder Cancer Neg Hx     Social History: Reviewed social history from progress note on 03/19/2018 Social History   Socioeconomic History   Marital status: Married    Spouse name: franklin   Number of children: 2   Years of education: Not on file   Highest education level: Some college, no degree  Occupational History    Comment: fulltime  Tobacco  Use   Smoking status: Every Day    Packs/day: 0.50    Years: 15.00    Pack years: 7.50    Types: E-cigarettes, Cigarettes    Start date: 06/28/2000   Smokeless tobacco: Never  Vaping Use   Vaping Use: Never used  Substance and Sexual Activity   Alcohol use: Yes    Alcohol/week: 0.0 standard drinks    Comment: special occ./ social   Drug use: No   Sexual activity: Yes    Partners: Male    Birth control/protection: None, Surgical, Injection  Other Topics Concern   Not on file  Social History Narrative   Not on file   Social Determinants of Health   Financial Resource Strain: Not on file  Food Insecurity: Not on file  Transportation Needs: Not on file  Physical Activity: Not on file  Stress: Not on file  Social Connections: Not on file    Allergies:  Allergies  Allergen Reactions   Abilify [Aripiprazole]     LIGHT SENSITIVITY    Metabolic Disorder Labs: No results found for: HGBA1C, MPG No results found for: PROLACTIN Lab Results  Component Value Date   CHOL 150 05/25/2015   TRIG 75 05/25/2015   HDL 48 05/25/2015   LDLCALC 87 05/25/2015   Lab Results  Component Value Date   TSH 1.866 03/30/2015    Therapeutic Level Labs: No results found for: LITHIUM No results found for: VALPROATE No components found for:  CBMZ  Current Medications: Current Outpatient Medications  Medication Sig Dispense Refill   sertraline (ZOLOFT) 50 MG tablet Take 1 tablet (50 mg total) by mouth daily. 90 tablet 0   cetirizine (ZYRTEC) 10 MG tablet Take by mouth.     doxepin (SINEQUAN) 10 MG capsule TAKE 1 TO 2 CAPSULES BY  MOUTH AT BEDTIME AS NEEDED  FOR SLEEP 90 capsule 7   fluconazole (DIFLUCAN) 150 MG tablet Take 150 mg by mouth once.     fluticasone (FLONASE) 50 MCG/ACT nasal spray Place 2 sprays into both nostrils daily.     hydrOXYzine (VISTARIL) 25 MG capsule Take 1-2 capsules (25-50 mg total) by mouth 3 times/day as needed-between meals & bedtime for anxiety (sleep). 180  capsule 1   ibuprofen (ADVIL,MOTRIN) 600 MG tablet Take 1 tablet (600 mg total) by mouth every 8 (eight) hours as needed. 15 tablet 0   ketorolac (TORADOL) 10 MG tablet SMARTSIG:1 Tablet(s) By Mouth Every 12 Hours PRN     lamoTRIgine (LAMICTAL) 25 MG tablet TAKE 2 TABLETS BY MOUTH  DAILY . 180 tablet 1   medroxyPROGESTERone Acetate 150 MG/ML SUSY Inject 1 mL into the muscle every 3 (three) months.     methocarbamol (ROBAXIN) 500 MG tablet Take 1 tablet (500 mg total) by mouth 2 (two) times daily. 20 tablet 0   metroNIDAZOLE (FLAGYL) 500 MG tablet Take 500 mg by mouth 2 (two) times daily.     Multiple Vitamins-Minerals (MULTIVITAMIN GUMMIES ADULT) CHEW Chew by mouth.     naproxen sodium (ALEVE) 220 MG tablet  Take by mouth.     nicotine (NICODERM CQ) 14 mg/24hr patch Place 1 patch (14 mg total) onto the skin daily. 28 patch 2   norethindrone (MICRONOR) 0.35 MG tablet TAKE 1 TABLET BY MOUTH ONCE DAILY     predniSONE (STERAPRED UNI-PAK 21 TAB) 10 MG (21) TBPK tablet Take by mouth daily. Take 6 tabs by mouth daily  for 2 days, then 5 tabs for 2 days, then 4 tabs for 2 days, then 3 tabs for 2 days, 2 tabs for 2 days, then 1 tab by mouth daily for 2 days 42 tablet 0   PREVIDENT 5000 DRY MOUTH 1.1 % GEL dental gel      No current facility-administered medications for this visit.     Musculoskeletal: Strength & Muscle Tone:  UTA Gait & Station: normal Patient leans: N/A  Psychiatric Specialty Exam: Review of Systems  Psychiatric/Behavioral:  The patient is nervous/anxious.   All other systems reviewed and are negative.  There were no vitals taken for this visit.There is no height or weight on file to calculate BMI.  General Appearance: Casual  Eye Contact:  Fair  Speech:  Normal Rate  Volume:  Normal  Mood:  Anxious coping well  Affect:  Congruent  Thought Process:  Goal Directed and Descriptions of Associations: Intact  Orientation:  Full (Time, Place, and Person)  Thought Content:  Logical   Suicidal Thoughts:  No  Homicidal Thoughts:  No  Memory:  Immediate;   Fair Recent;   Fair Remote;   Fair  Judgement:  Fair  Insight:  Fair  Psychomotor Activity:  Normal  Concentration:  Concentration: Fair and Attention Span: Fair  Recall:  [employer] of Knowledge: Fair  Language: Fair  Akathisia:  No  Handed:  Right  AIMS (if indicated): done  Assets:  Communication Skills Desire for Improvement Social Support Talents/Skills Transportation  ADL's:  Intact  Cognition: WNL  Sleep:  Fair   Screenings: PHQ2-9    Flowsheet Row Video Visit from 06/28/2021 in [facility] Video Visit from 05/13/2021 in [facility] Video Visit from 02/02/2021 in [facility]  PHQ-2 Total Score 0 0 0      Flowsheet Row Video Visit from 06/28/2021 in [facility] ED from 02/15/2021 in [facility]  Video Visit from 02/02/2021 in [facility]  C-SSRS RISK CATEGORY No Risk No Risk No Risk        Assessment and Plan: Rikayla Demmon is a 40 year old Caucasian female who has a history of MDD, anxiety, insomnia was evaluated by telemedicine today.  Patient is currently stable however continues to be interested in smoking cessation.  Plan as noted below.  Plan MDD in remission Zoloft 50 mg p.o. daily Lamotrigine 50 mg p.o. daily at reduced dosage. Doxepin 10 to 20 mg p.o. nightly as needed.  GAD-stable Zoloft as prescribed  Social anxiety disorder-stable Monitor closely  Tobacco use disorder-unstable Continue NicoDerm patch 14 mg p.o. daily. Provided counseling for 5 minutes.  Follow-up in clinic in 3 months in person or sooner if needed.  This note was generated in part or whole with voice recognition software. Voice recognition is usually quite accurate but there are transcription errors that can and very often do occur. I  apologize for any typographical errors that were not detected and corrected.      [name], MD 09/28/2021, 8:29 AM

## 2021-11-28 ENCOUNTER — Other Ambulatory Visit: Payer: Self-pay | Admitting: Psychiatry

## 2021-11-28 DIAGNOSIS — F411 Generalized anxiety disorder: Secondary | ICD-10-CM

## 2021-11-28 DIAGNOSIS — F3342 Major depressive disorder, recurrent, in full remission: Secondary | ICD-10-CM

## 2021-11-28 DIAGNOSIS — F4010 Social phobia, unspecified: Secondary | ICD-10-CM

## 2021-12-23 ENCOUNTER — Encounter: Payer: Self-pay | Admitting: Psychiatry

## 2021-12-23 ENCOUNTER — Other Ambulatory Visit: Payer: Self-pay

## 2021-12-23 ENCOUNTER — Ambulatory Visit (INDEPENDENT_AMBULATORY_CARE_PROVIDER_SITE_OTHER): Payer: BC Managed Care – PPO | Admitting: Psychiatry

## 2021-12-23 VITALS — BP 116/74 | HR 87 | Temp 98.8°F | Wt 158.8 lb

## 2021-12-23 DIAGNOSIS — F3342 Major depressive disorder, recurrent, in full remission: Secondary | ICD-10-CM | POA: Diagnosis not present

## 2021-12-23 DIAGNOSIS — F411 Generalized anxiety disorder: Secondary | ICD-10-CM | POA: Diagnosis not present

## 2021-12-23 DIAGNOSIS — F17200 Nicotine dependence, unspecified, uncomplicated: Secondary | ICD-10-CM | POA: Diagnosis not present

## 2021-12-23 DIAGNOSIS — F4010 Social phobia, unspecified: Secondary | ICD-10-CM

## 2021-12-23 NOTE — Progress Notes (Signed)
[facility] MD OP Progress Note  12/23/2021 6:05 PM Janice Gray  MRN:  [id number]  Chief Complaint:  Chief Complaint   Follow-up; Depression    HPI: Janice Gray is a 41 year old Caucasian female, married, employed, lives in [place], has a history of MDD, social anxiety disorder, GAD was evaluated in office today.  Patient today reports she does have psychosocial stressors of her daughter moving out.  Her daughter is 67 years old and is currently living with a friend.  She reports this does worry her a lot.  Patient however reports she has been coping with her anxiety and is currently compliant on medications which are beneficial.  Denies any sadness, mood lability or crying spells.  Reports sleep is overall okay.  Does not take any sleep medications.  Patient denies any suicidality, homicidality or perceptual disturbances.  Patient denies any other concerns today.  Visit Diagnosis:    ICD-10-CM   1. MDD (major depressive disorder), recurrent, in full remission ([place])  F33.42     2. Social anxiety disorder  F40.10     3. GAD (generalized anxiety disorder)  F41.1     4. Tobacco use disorder  F17.200       Past Psychiatric History: Reviewed past psychiatric history from progress note on 03/19/2018.  Past trials of medications like Abilify-light sensitivity, Seroquel-groggy, Wellbutrin-increased anxiety and Xanax  Past Medical History:  Past Medical History:  Diagnosis Date   Anxiety    Depression    Headache    sinus   History of kidney stones    Microscopic hematuria    Motion sickness    cars   Wears contact lenses     Past Surgical History:  Procedure Laterality Date   BREAST SURGERY     Implants   ENDOSCOPIC CONCHA BULLOSA RESECTION Right 11/17/2016   Procedure: ENDOSCOPIC CONCHA BULLOSA RESECTION;  Surgeon: Margaretha Sheffield, MD;  Location: [facility];  Service: ENT;  Laterality: Right;   MANDIBLE FRACTURE SURGERY     SEPTOPLASTY N/A 11/17/2016   Procedure:  SEPTOPLASTY;  Surgeon: Margaretha Sheffield, MD;  Location: [facility];  Service: ENT;  Laterality: N/A;   TUBAL LIGATION Bilateral 10/26/2018   TURBINATE REDUCTION Bilateral 11/17/2016   Procedure: PARTIAL INFERIOR TURBINATE REDUCTION;  Surgeon: Margaretha Sheffield, MD;  Location: [facility];  Service: ENT;  Laterality: Bilateral;   Wisdom Teeth Surgery      Family Psychiatric History: Reviewed family psychiatric history from progress note on 03/19/2018.  Family History:  Family History  Problem Relation Age of Onset   Kidney disease Neg Hx    Bladder Cancer Neg Hx     Social History: Reviewed social history from progress note on 03/19/2018. Social History   Socioeconomic History   Marital status: Married    Spouse name: franklin   Number of children: 2   Years of education: Not on file   Highest education level: Some college, no degree  Occupational History    Comment: fulltime  Tobacco Use   Smoking status: Every Day    Packs/day: 0.50    Years: 15.00    Pack years: 7.50    Types: E-cigarettes, Cigarettes    Start date: 06/28/2000   Smokeless tobacco: Never  Vaping Use   Vaping Use: Never used  Substance and Sexual Activity   Alcohol use: Yes    Alcohol/week: 0.0 standard drinks    Comment: special occ./ social   Drug use: No   Sexual activity: Yes  Partners: Male    Birth control/protection: None, Surgical, Injection  Other Topics Concern   Not on file  Social History Narrative   Not on file   Social Determinants of Health   Financial Resource Strain: Not on file  Food Insecurity: Not on file  Transportation Needs: Not on file  Physical Activity: Not on file  Stress: Not on file  Social Connections: Not on file    Allergies:  Allergies  Allergen Reactions   Abilify [Aripiprazole]     LIGHT SENSITIVITY    Metabolic Disorder Labs: No results found for: HGBA1C, MPG No results found for: PROLACTIN Lab Results  Component Value Date   CHOL 150  05/25/2015   TRIG 75 05/25/2015   HDL 48 05/25/2015   LDLCALC 87 05/25/2015   Lab Results  Component Value Date   TSH 1.866 03/30/2015    Therapeutic Level Labs: No results found for: LITHIUM No results found for: VALPROATE No components found for:  CBMZ  Current Medications: Current Outpatient Medications  Medication Sig Dispense Refill   cetirizine (ZYRTEC) 10 MG tablet Take by mouth.     doxepin (SINEQUAN) 10 MG capsule TAKE 1 TO 2 CAPSULES BY  MOUTH AT BEDTIME AS NEEDED  FOR SLEEP 90 capsule 7   fluconazole (DIFLUCAN) 150 MG tablet Take 150 mg by mouth once.     fluticasone (FLONASE) 50 MCG/ACT nasal spray Place 2 sprays into both nostrils daily.     hydrOXYzine (VISTARIL) 25 MG capsule Take 1-2 capsules (25-50 mg total) by mouth 3 times/day as needed-between meals & bedtime for anxiety (sleep). 180 capsule 1   ibuprofen (ADVIL,MOTRIN) 600 MG tablet Take 1 tablet (600 mg total) by mouth every 8 (eight) hours as needed. 15 tablet 0   ketorolac (TORADOL) 10 MG tablet SMARTSIG:1 Tablet(s) By Mouth Every 12 Hours PRN     lamoTRIgine (LAMICTAL) 25 MG tablet TAKE 2 TABLETS BY MOUTH  DAILY . 180 tablet 1   medroxyPROGESTERone Acetate 150 MG/ML SUSY Inject 1 mL into the muscle every 3 (three) months.     methocarbamol (ROBAXIN) 500 MG tablet Take 1 tablet (500 mg total) by mouth 2 (two) times daily. 20 tablet 0   metroNIDAZOLE (FLAGYL) 500 MG tablet Take 500 mg by mouth 2 (two) times daily.     Multiple Vitamins-Minerals (MULTIVITAMIN GUMMIES ADULT) CHEW Chew by mouth.     naproxen sodium (ALEVE) 220 MG tablet Take by mouth.     nicotine (NICODERM CQ) 14 mg/24hr patch Place 1 patch (14 mg total) onto the skin daily. 28 patch 2   norethindrone (MICRONOR) 0.35 MG tablet TAKE 1 TABLET BY MOUTH ONCE DAILY     predniSONE (STERAPRED UNI-PAK 21 TAB) 10 MG (21) TBPK tablet Take by mouth daily. Take 6 tabs by mouth daily  for 2 days, then 5 tabs for 2 days, then 4 tabs for 2 days, then 3 tabs for  2 days, 2 tabs for 2 days, then 1 tab by mouth daily for 2 days 42 tablet 0   PREVIDENT 5000 DRY MOUTH 1.1 % GEL dental gel      sertraline (ZOLOFT) 50 MG tablet TAKE 1 TABLET BY MOUTH  DAILY 90 tablet 0   No current facility-administered medications for this visit.     Musculoskeletal: Strength & Muscle Tone: within normal limits Gait & Station: normal Patient leans: N/A  Psychiatric Specialty Exam: Review of Systems  Psychiatric/Behavioral:  The patient is nervous/anxious.   All other systems reviewed and  are negative.  Blood pressure 116/74, pulse 87, temperature 98.8 F (37.1 C), temperature source Temporal, weight 158 lb 12.8 oz (72 kg).Body mass index is 25.63 kg/m.  General Appearance: Casual  Eye Contact:  Fair  Speech:  Clear and Coherent  Volume:  Normal  Mood:  Anxious coping well  Affect:  Appropriate  Thought Process:  Goal Directed and Descriptions of Associations: Intact  Orientation:  Full (Time, Place, and Person)  Thought Content: Logical   Suicidal Thoughts:  No  Homicidal Thoughts:  No  Memory:  Immediate;   Fair Recent;   Fair Remote;   Fair  Judgement:  Fair  Insight:  Fair  Psychomotor Activity:  Normal  Concentration:  Concentration: Fair and Attention Span: Fair  Recall:  [employer] of Knowledge: Fair  Language: Fair  Akathisia:  No  Handed:  Right  AIMS (if indicated): not done  Assets:  Communication Skills Desire for Improvement Housing Social Support  ADL's:  Intact  Cognition: WNL  Sleep:  Fair   Screenings: GAD-7    [facility] Office Visit from 12/23/2021 in [facility]  Total GAD-7 Score 6      PHQ2-9    [place] Visit from 12/23/2021 in [place] [place] Video Visit from 06/28/2021 in [facility] Video Visit from 05/13/2021 in [facility] Video Visit from 02/02/2021 in [facility]  PHQ-2 Total Score 0 0 0 0      Flowsheet Row Video Visit from 06/28/2021 in [facility] ED from 02/15/2021 in [facility]  Video Visit from 02/02/2021 in [facility] No Risk No Risk No Risk        Assessment and Plan: Dezrae Gillock is a 42 year old Caucasian female who has a history of MDD, anxiety, insomnia was evaluated by telemedicine today.  Patient is currently stable.  Plan as noted below.  Plan MDD in remission Zoloft 50 mg p.o. daily Lamotrigine 50 mg p.o. daily at reduced dosage Doxepin 10 to 20 mg p.o. nightly as needed  GAD-stable Zoloft as prescribed  Social anxiety disorder-stable Monitor closely  Tobacco use disorder-improving Provided counseling for 2 minutes.  Follow-up in clinic in 3 months or sooner if needed.  This note was generated in part or whole with voice recognition software. Voice recognition is usually quite accurate but there are transcription errors that can and very often do occur. I apologize for any typographical errors that were not detected and corrected.       Janice Alert, MD 12/24/2021, 1:54 PM

## 2022-01-06 ENCOUNTER — Ambulatory Visit
Admission: RE | Admit: 2022-01-06 | Discharge: 2022-01-06 | Disposition: A | Discharge status: Home / Self Care | Payer: BC Managed Care – PPO | Source: Ambulatory Visit | Attending: Emergency Medicine | Admitting: Emergency Medicine

## 2022-01-06 ENCOUNTER — Other Ambulatory Visit: Payer: Self-pay

## 2022-01-06 VITALS — BP 108/60 | HR 90 | Temp 98.2°F | Resp 18 | Ht 66.0 in | Wt 150.0 lb

## 2022-01-06 DIAGNOSIS — G8929 Other chronic pain: Secondary | ICD-10-CM | POA: Diagnosis not present

## 2022-01-06 DIAGNOSIS — M25562 Pain in left knee: Secondary | ICD-10-CM | POA: Diagnosis not present

## 2022-01-06 MED ORDER — PREDNISONE 10 MG (21) PO TBPK: ORAL_TABLET | Freq: Every day | ORAL | 0 refills | Status: DC

## 2022-01-06 NOTE — ED Provider Notes (Signed)
[facility]    CSN: [id number] Arrival date & time: 01/06/22  1346      History   Chief Complaint Chief Complaint  Patient presents with   Knee Pain    Left. Appt: 200 pm.     HPI Janice Gray is a 41 y.o. female.   Patient has chronic knee pain with swelling.  States her left knee has been having increased swelling more than normal since being at work bending and twisting more frequently.  Patient denies any injury.  Has been wearing a leg brace and using heat which seems to help some.  The swelling fluctuates depending if she is on her feet all day.  Patient has not seen an orthopedic for this.  Denies any chest pain or shortness of breath no swelling to lower extremities.   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/2019   Chronic seasonal allergic rhinitis due to pollen 08/02/2016   Major depressive disorder, recurrent episode, moderate ([place]) 03/30/2016   Social anxiety disorder 03/30/2016   Irritable bowel syndrome with constipation 11/24/2015   Calculus of kidney 11/24/2015   Anxiety 11/04/2015   Cigarette smoker 11/04/2015   Ureteral stone 08/30/2015   Hydronephrosis with urinary obstruction due to ureteral calculus 08/30/2015   Microscopic hematuria 08/30/2015   History of nephrolithiasis 08/30/2015   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/28/2015   Depression, major, recurrent, moderate ([place]) 08/28/2015   Phobia, 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   Clinical depression 06/29/2015    Past  Surgical History:  Procedure Laterality Date   BREAST SURGERY     Implants   ENDOSCOPIC CONCHA BULLOSA RESECTION Right 11/17/2016   Procedure: ENDOSCOPIC CONCHA BULLOSA RESECTION;  Surgeon: Margaretha Sheffield, MD;  Location: [facility];  Service: ENT;  Laterality: Right;   MANDIBLE FRACTURE SURGERY     SEPTOPLASTY N/A 11/17/2016   Procedure: SEPTOPLASTY;  Surgeon: Margaretha Sheffield, MD;  Location: [facility];  Service: ENT;  Laterality: N/A;   TUBAL LIGATION Bilateral 10/26/2018   TURBINATE REDUCTION Bilateral 11/17/2016   Procedure: PARTIAL INFERIOR TURBINATE REDUCTION;  Surgeon: Margaretha Sheffield, MD;  Location: [facility];  Service: ENT;  Laterality: Bilateral;   Wisdom Teeth Surgery      OB History   No obstetric history on file.      Home Medications    Prior to Admission medications   Medication Sig Start Date End Date Taking? Authorizing Provider  cetirizine (ZYRTEC) 10 MG tablet Take by mouth.   Yes [provider]  lamoTRIgine (LAMICTAL) 25 MG tablet TAKE 2 TABLETS BY MOUTH  DAILY . 07/26/21  Yes Eappen, Ria Clock, MD  predniSONE (STERAPRED UNI-PAK 21 TAB) 10 MG (21) TBPK tablet Take by mouth daily. Take 6 tabs by mouth daily  for 2 days, then 5 tabs for 2 days, then 4 tabs for 2 days, then 3 tabs for 2 days, 2 tabs for 2 days, then 1 tab by mouth daily for 2 days  01/06/22  Yes Morley Kos L, NP  sertraline (ZOLOFT) 50 MG tablet TAKE 1 TABLET BY MOUTH  DAILY 11/29/21  Yes Eappen, Saramma, MD  doxepin (SINEQUAN) 10 MG capsule TAKE 1 TO 2 CAPSULES BY  MOUTH AT BEDTIME AS NEEDED  FOR SLEEP 05/19/20   Ursula Alert, MD  fluconazole (DIFLUCAN) 150 MG tablet Take 150 mg by mouth once. 04/07/21   [provider]  fluticasone (FLONASE) 50 MCG/ACT nasal spray Place 2 sprays into both nostrils daily.    [provider]  hydrOXYzine (VISTARIL) 25 MG capsule Take 1-2 capsules (25-50 mg total) by mouth 3 times/day as needed-between meals & bedtime for  anxiety (sleep). 05/21/18   Ursula Alert, MD  ibuprofen (ADVIL,MOTRIN) 600 MG tablet Take 1 tablet (600 mg total) by mouth every 8 (eight) hours as needed. 08/20/15   Paulette Blanch, MD  ketorolac (TORADOL) 10 MG tablet SMARTSIG:1 Tablet(s) By Mouth Every 12 Hours PRN 02/12/21   [provider]  medroxyPROGESTERone Acetate 150 MG/ML SUSY Inject 1 mL into the muscle every 3 (three) months. 08/03/20   [provider]  methocarbamol (ROBAXIN) 500 MG tablet Take 1 tablet (500 mg total) by mouth 2 (two) times daily. 02/15/21   Verda Cumins, MD  metroNIDAZOLE (FLAGYL) 500 MG tablet Take 500 mg by mouth 2 (two) times daily. 04/26/20   [provider]  Multiple Vitamins-Minerals (MULTIVITAMIN GUMMIES ADULT) CHEW Chew by mouth.    [provider]  naproxen sodium (ALEVE) 220 MG tablet Take by mouth.    [provider]  nicotine (NICODERM CQ) 14 mg/24hr patch Place 1 patch (14 mg total) onto the skin daily. 05/14/21   Ursula Alert, MD  norethindrone (MICRONOR) 0.35 MG tablet TAKE 1 TABLET BY MOUTH ONCE DAILY 09/24/19   [provider]  PREVIDENT 5000 DRY MOUTH 1.1 % GEL dental gel  05/18/19   [provider]    Family History Family History  Problem Relation Age of Onset   Kidney disease Neg Hx    Bladder Cancer Neg Hx     Social History Social History   Tobacco Use   Smoking status: Every Day    Packs/day: 0.50    Years: 15.00    Pack years: 7.50    Types: E-cigarettes, Cigarettes    Start date: 06/28/2000   Smokeless tobacco: Never  Vaping Use   Vaping Use: Never used  Substance Use Topics   Alcohol use: Yes    Alcohol/week: 0.0 standard drinks    Comment: special occ./ social   Drug use: No     Allergies   Abilify [aripiprazole]   Review of Systems Review of Systems  Constitutional: Negative.  Negative for fever.  Respiratory: Negative.    Cardiovascular: Negative.   Gastrointestinal: Negative.   Musculoskeletal:   Positive for joint swelling.       Left knee  Skin:        Slight swelling to the left side of knee  Neurological: Negative.     Physical Exam Triage Vital Signs ED Triage Vitals [01/06/22 1352]  Enc Vitals Group     BP 108/60     Pulse Rate 90     Resp 18     Temp 98.2 F (36.8 C)     Temp Source Oral     SpO2 100 %     Weight 150 lb (68 kg)     Height 5\' 6"  (1.676 m)     Head Circumference  Peak Flow      Pain Score 4     Pain Loc      Pain Edu?      Excl. in [place]?    No data found.  Updated Vital Signs BP 108/60 (BP Location: Left Arm)    Pulse 90    Temp 98.2 F (36.8 C) (Oral)    Resp 18    Ht 5\' 6"  (1.676 m)    Wt 150 lb (68 kg)    LMP  (LMP Unknown)    SpO2 100%    BMI 24.21 kg/m   Visual Acuity Right Eye Distance:   Left Eye Distance:   Bilateral Distance:    Right Eye Near:   Left Eye Near:    Bilateral Near:     Physical Exam Constitutional:      Appearance: Normal appearance.  Cardiovascular:     Rate and Rhythm: Normal rate.  Pulmonary:     Effort: Pulmonary effort is normal.  Musculoskeletal:        General: Swelling and tenderness present. No signs of injury. Normal range of motion.     Left lower leg: Edema present.     Comments: Normal range of motion with tenderness with extension of left knee.  Small moderate amount of edema to the left lateral side and popliteal.  Strong pulses no signs of erythema.  Negative Homans' sign  Skin:    General: Skin is warm.     Capillary Refill: Capillary refill takes less than 2 seconds.     Findings: No erythema.  Neurological:     General: No focal deficit present.     Mental Status: She is alert.     UC Treatments / Results  Labs (all labs ordered are listed, but only abnormal results are displayed) Labs Reviewed - No data to display  EKG   Radiology No results found.  Procedures Procedures (including critical care time)  Medications Ordered in UC Medications - No data to  display  Initial Impression / Assessment and Plan / UC Course  I have reviewed the triage vital signs and the nursing notes.  Pertinent labs & imaging results that were available during my care of the patient were reviewed by me and considered in my medical decision making (see chart for details).     Patient will need to continue to wear her knee brace. Take NSAIDs as needed for pain. Use warm compresses or heating pad to decrease edema If symptoms becomes worse that she needs to see [facility] for x-rays and further treatment. Final Clinical Impressions(s) / UC Diagnoses   Final diagnoses:  Chronic pain of left knee     Discharge Instructions      atient will need to continue to wear her knee brace. Take NSAIDs as needed for pain. Use warm compresses or heating pad to decrease edema If symptoms becomes worse that she needs to see [facility] for x-rays and further treatment.     ED Prescriptions     Medication Sig Dispense Auth. Provider   predniSONE (STERAPRED UNI-PAK 21 TAB) 10 MG (21) TBPK tablet Take by mouth daily. Take 6 tabs by mouth daily  for 2 days, then 5 tabs for 2 days, then 4 tabs for 2 days, then 3 tabs for 2 days, 2 tabs for 2 days, then 1 tab by mouth daily for 2 days 42 tablet Marney Setting, NP      PDMP not reviewed this encounter.   Morley Kos  L, NP 01/06/22 1508

## 2022-01-06 NOTE — Discharge Instructions (Addendum)
atient will need to continue to wear her knee brace. Take NSAIDs as needed for pain. Use warm compresses or heating pad to decrease edema If symptoms becomes worse that she needs to see [facility] for x-rays and further treatment.

## 2022-01-06 NOTE — ED Triage Notes (Signed)
Patient is here for "Left Knee Pain". Started hurting "about 2 wks ago". Now a real big "lump" on it. Both knees hurt at times for a while now, This is much worse. No injury known.

## 2022-01-20 ENCOUNTER — Other Ambulatory Visit: Payer: Self-pay | Admitting: Psychiatry

## 2022-01-20 DIAGNOSIS — F3342 Major depressive disorder, recurrent, in full remission: Secondary | ICD-10-CM

## 2022-03-13 ENCOUNTER — Other Ambulatory Visit: Payer: Self-pay | Admitting: Psychiatry

## 2022-03-13 DIAGNOSIS — F411 Generalized anxiety disorder: Secondary | ICD-10-CM

## 2022-03-13 DIAGNOSIS — F3342 Major depressive disorder, recurrent, in full remission: Secondary | ICD-10-CM

## 2022-03-13 DIAGNOSIS — F4010 Social phobia, unspecified: Secondary | ICD-10-CM

## 2022-04-13 ENCOUNTER — Telehealth (INDEPENDENT_AMBULATORY_CARE_PROVIDER_SITE_OTHER): Payer: BC Managed Care – PPO | Admitting: Psychiatry

## 2022-04-13 ENCOUNTER — Encounter: Payer: Self-pay | Admitting: Psychiatry

## 2022-04-13 DIAGNOSIS — F411 Generalized anxiety disorder: Secondary | ICD-10-CM | POA: Diagnosis not present

## 2022-04-13 DIAGNOSIS — F17200 Nicotine dependence, unspecified, uncomplicated: Secondary | ICD-10-CM

## 2022-04-13 DIAGNOSIS — F4010 Social phobia, unspecified: Secondary | ICD-10-CM

## 2022-04-13 DIAGNOSIS — F3342 Major depressive disorder, recurrent, in full remission: Secondary | ICD-10-CM | POA: Diagnosis not present

## 2022-04-13 MED ORDER — VARENICLINE TARTRATE 0.5 MG PO TABS: 0.5000 mg | ORAL_TABLET | Freq: Two times a day (BID) | ORAL | 5 refills | Status: DC

## 2022-04-13 NOTE — Progress Notes (Signed)
Virtual Visit via Video Note ? ?I connected with Janice Gray on 04/13/22 at  3:00 PM EDT by a video enabled telemedicine application and verified that I am speaking with the correct person using two identifiers. ? ?Location ?Provider Location : [facility] ?Patient Location : Home ? ?Participants: Patient , Provider ? ?  ?I discussed the limitations of evaluation and management by telemedicine and the availability of in person appointments. The patient expressed understanding and agreed to proceed. ?  ?I discussed the assessment and treatment plan with the patient. The patient was provided an opportunity to ask questions and all were answered. The patient agreed with the plan and demonstrated an understanding of the instructions. ?  ?The patient was advised to call back or seek an in-person evaluation if the symptoms worsen or if the condition fails to improve as anticipated. ? ? ?BH MD OP Progress Note ? ?04/13/2022 3:18 PM ?Janice Gray  ?MRN:  [id number] ? ?Chief Complaint:  ?Chief Complaint  ?Patient presents with  ? Follow-up: 41 year old Caucasian female with history of depression, anxiety, tobacco use disorder, presented for medication management.  ? ?HPI: Janice Gray is a 41 year old Caucasian female, married, employed, lives in [place], has a history of MDD, social anxiety disorder, GAD was evaluated by telemedicine today. ? ?Patient today reports overall mood symptoms are stable.  Denies any significant anxiety and restlessness, racing thoughts or sadness.  Patient reports she feels motivated to do things as well as her work.  Work is going well. ? ?Patient reports sleep as good. ? ?Denies suicidality, homicidality or perceptual disturbances. ? ?Currently compliant on medications, denies side effects. ? ?Patient is interested in smoking cessation, would like to try Chantix. ? ?Patient denies any other concerns today. ? ?Visit Diagnosis:  ?  ICD-10-CM   ?1. MDD (major depressive disorder), recurrent, in full  remission (HCC)  F33.42   ?  ?2. Social anxiety disorder  F40.10   ?  ?3. GAD (generalized anxiety disorder)  F41.1   ?  ?4. Tobacco use disorder  F17.200 varenicline (CHANTIX) 0.5 MG tablet  ?  ? ? ?Past Psychiatric History: Reviewed past psychiatric history from progress note on 03/19/2018.  Past trials of medications like Abilify-light sensitivity, Seroquel-groggy, Wellbutrin-increased anxiety, Xanax. ? ?Past Medical History:  ?Past Medical History:  ?Diagnosis Date  ? Anxiety   ? Depression   ? Headache   ? sinus  ? History of kidney stones   ? Microscopic hematuria   ? Motion sickness   ? cars  ? Wears contact lenses   ?  ?Past Surgical History:  ?Procedure Laterality Date  ? BREAST SURGERY    ? Implants  ? ENDOSCOPIC CONCHA BULLOSA RESECTION Right 11/17/2016  ? Procedure: ENDOSCOPIC CONCHA BULLOSA RESECTION;  Surgeon: Vernie Murders, MD;  Location: [facility];  Service: ENT;  Laterality: Right;  ? MANDIBLE FRACTURE SURGERY    ? SEPTOPLASTY N/A 11/17/2016  ? Procedure: SEPTOPLASTY;  Surgeon: Vernie Murders, MD;  Location: [facility];  Service: ENT;  Laterality: N/A;  ? TUBAL LIGATION Bilateral 10/26/2018  ? TURBINATE REDUCTION Bilateral 11/17/2016  ? Procedure: PARTIAL INFERIOR TURBINATE REDUCTION;  Surgeon: Vernie Murders, MD;  Location: [facility];  Service: ENT;  Laterality: Bilateral;  ? Wisdom Teeth Surgery    ? ? ?Family Psychiatric History: Reviewed family psychiatric history from progress note on 03/19/2018. ? ?Family History:  ?Family History  ?Problem Relation Age of Onset  ? Kidney disease Neg Hx   ? Bladder Cancer  Neg Hx   ? ? ?Social History: Reviewed social history from progress note on 03/19/2018. ?Social History  ? ?Socioeconomic History  ? Marital status: Married  ?  Spouse name: franklin  ? Number of children: 2  ? Years of education: Not on file  ? Highest education level: Some college, no degree  ?Occupational History  ?  Comment: fulltime  ?Tobacco Use  ? Smoking status:  Every Day  ?  Packs/day: 0.50  ?  Years: 15.00  ?  Pack years: 7.50  ?  Types: E-cigarettes, Cigarettes  ?  Start date: 06/28/2000  ? Smokeless tobacco: Never  ?Vaping Use  ? Vaping Use: Never used  ?Substance and Sexual Activity  ? Alcohol use: Yes  ?  Alcohol/week: 0.0 standard drinks  ?  Comment: special occ./ social  ? Drug use: No  ? Sexual activity: Yes  ?  Partners: Male  ?  Birth control/protection: None, Surgical, Injection  ?Other Topics Concern  ? Not on file  ?Social History Narrative  ? Not on file  ? ?Social Determinants of Health  ? ?Financial Resource Strain: Not on file  ?Food Insecurity: Not on file  ?Transportation Needs: Not on file  ?Physical Activity: Not on file  ?Stress: Not on file  ?Social Connections: Not on file  ? ? ?Allergies:  ?Allergies  ?Allergen Reactions  ? Abilify [Aripiprazole]   ?  LIGHT SENSITIVITY  ? ? ?Metabolic Disorder Labs: ?No results found for: HGBA1C, MPG ?No results found for: PROLACTIN ?Lab Results  ?Component Value Date  ? CHOL 150 05/25/2015  ? TRIG 75 05/25/2015  ? HDL 48 05/25/2015  ? LDLCALC 87 05/25/2015  ? ?Lab Results  ?Component Value Date  ? TSH 1.866 03/30/2015  ? ? ?Therapeutic Level Labs: ?No results found for: LITHIUM ?No results found for: VALPROATE ?No components found for:  CBMZ ? ?Current Medications: ?Current Outpatient Medications  ?Medication Sig Dispense Refill  ? varenicline (CHANTIX) 0.5 MG tablet Take 1 tablet (0.5 mg total) by mouth 2 (two) times daily. 60 tablet 5  ? cetirizine (ZYRTEC) 10 MG tablet Take by mouth.    ? doxepin (SINEQUAN) 10 MG capsule TAKE 1 TO 2 CAPSULES BY  MOUTH AT BEDTIME AS NEEDED  FOR SLEEP 90 capsule 7  ? fluconazole (DIFLUCAN) 150 MG tablet Take 150 mg by mouth once.    ? fluticasone (FLONASE) 50 MCG/ACT nasal spray Place 2 sprays into both nostrils daily.    ? hydrOXYzine (VISTARIL) 25 MG capsule Take 1-2 capsules (25-50 mg total) by mouth 3 times/day as needed-between meals & bedtime for anxiety (sleep). 180 capsule  1  ? ibuprofen (ADVIL,MOTRIN) 600 MG tablet Take 1 tablet (600 mg total) by mouth every 8 (eight) hours as needed. 15 tablet 0  ? ketorolac (TORADOL) 10 MG tablet SMARTSIG:1 Tablet(s) By Mouth Every 12 Hours PRN    ? lamoTRIgine (LAMICTAL) 25 MG tablet TAKE 2 TABLETS BY MOUTH  DAILY 180 tablet 3  ? medroxyPROGESTERone Acetate 150 MG/ML SUSY Inject 1 mL into the muscle every 3 (three) months.    ? methocarbamol (ROBAXIN) 500 MG tablet Take 1 tablet (500 mg total) by mouth 2 (two) times daily. 20 tablet 0  ? metroNIDAZOLE (FLAGYL) 500 MG tablet Take 500 mg by mouth 2 (two) times daily.    ? Multiple Vitamins-Minerals (MULTIVITAMIN GUMMIES ADULT) CHEW Chew by mouth.    ? naproxen sodium (ALEVE) 220 MG tablet Take by mouth.    ? nicotine (NICODERM CQ)  14 mg/24hr patch Place 1 patch (14 mg total) onto the skin daily. 28 patch 2  ? norethindrone (MICRONOR) 0.35 MG tablet TAKE 1 TABLET BY MOUTH ONCE DAILY    ? predniSONE (STERAPRED UNI-PAK 21 TAB) 10 MG (21) TBPK tablet Take by mouth daily. Take 6 tabs by mouth daily  for 2 days, then 5 tabs for 2 days, then 4 tabs for 2 days, then 3 tabs for 2 days, 2 tabs for 2 days, then 1 tab by mouth daily for 2 days 42 tablet 0  ? PREVIDENT 5000 DRY MOUTH 1.1 % GEL dental gel     ? sertraline (ZOLOFT) 50 MG tablet TAKE 1 TABLET BY MOUTH DAILY 90 tablet 1  ? ?No current facility-administered medications for this visit.  ? ? ? ?Musculoskeletal: ?Strength & Muscle Tone:  UTA ?Gait & Station:  Seated ?Patient leans: N/A ? ?Psychiatric Specialty Exam: ?Review of Systems  ?Psychiatric/Behavioral:  Negative for agitation, behavioral problems, confusion, decreased concentration, dysphoric mood, hallucinations, self-injury, sleep disturbance and suicidal ideas. The patient is not nervous/anxious and is not hyperactive.   ?All other systems reviewed and are negative.  ?There were no vitals taken for this visit.There is no height or weight on file to calculate BMI.  ?General Appearance: Casual   ?Eye Contact:  Fair  ?Speech:  Clear and Coherent  ?Volume:  Normal  ?Mood:  Euthymic  ?Affect:  Congruent  ?Thought Process:  Goal Directed and Descriptions of Associations: Intact  ?Orientation:  Full

## 2022-04-13 NOTE — Patient Instructions (Signed)
Varenicline Tablets What is this medication? VARENICLINE (var e NI kleen) helps you quit smoking. It reduces cravings for nicotine, the addictive substance found in tobacco. It is most effective when used in combination with a stop-smoking program. This medicine may be used for other purposes; ask your health care provider or pharmacist if you have questions. COMMON BRAND NAME(S): Chantix What should I tell my care team before I take this medication? They need to know if you have any of these conditions: Heart disease Frequently drink alcohol Kidney disease Mental health condition On hemodialysis Seizures History of stroke Suicidal thoughts, plans, or attempt by you or a family member An unusual or allergic reaction to varenicline, other medications, foods, dyes, or preservatives Pregnant or trying to get pregnant Breast-feeding How should I use this medication? Take this medication by mouth after eating. Take with a full glass of water. Follow the directions on the prescription label. Take your doses at regular intervals. Do not take your medication more often than directed. There are 3 ways you can use this medication to help you quit smoking; talk to your care team to decide which plan is right for you: 1) you can choose a quit date and start this medication 1 week before the quit date, or, 2) you can start taking this medication before you choose a quit date, and then pick a quit date between day 8 and 35 days of treatment, or, 3) if you are not sure that you are able or willing to quit smoking right away, start taking this medication and slowly decrease the amount you smoke as directed by your care team with the goal of being cigarette-free by week 12 of treatment. Stick to your plan; ask about support groups or other ways to help you remain cigarette-free. If you are motivated to quit smoking and did not succeed during a previous attempt with this medication for reasons other than side  effects, or if you returned to smoking after this treatment, speak with your care team about whether another course of this medication may be right for you. A special MedGuide will be given to you by the pharmacist with each prescription and refill. Be sure to read this information carefully each time. Talk to your care team about the use of this medication in children. This medication is not approved for use in children. Overdosage: If you think you have taken too much of this medicine contact a poison control center or emergency room at once. NOTE: This medicine is only for you. Do not share this medicine with others. What if I miss a dose? If you miss a dose, take it as soon as you can. If it is almost time for your next dose, take only that dose. Do not take double or extra doses. What may interact with this medication? Alcohol Insulin Other medications used to help people quit smoking Theophylline Warfarin This list may not describe all possible interactions. Give your health care provider a list of all the medicines, herbs, non-prescription drugs, or dietary supplements you use. Also tell them if you smoke, drink alcohol, or use illegal drugs. Some items may interact with your medicine. What should I watch for while using this medication? It is okay if you do not succeed at your attempt to quit and have a cigarette. You can still continue your quit attempt and keep using this medication as directed. Just throw away your cigarettes and get back to your quit plan. Talk to your care team   before using other treatments to quit smoking. Using this medication with other treatments to quit smoking may increase the risk for side effects compared to using a treatment alone. This medication may affect your coordination, reaction time, or judgment. Do not drive or operate machinery until you know how this medication affects you. Sit up or stand slowly to reduce the risk of dizzy or fainting  spells. Decrease the number of alcoholic beverages that you drink during treatment with this medication until you know if this medication affects your ability to tolerate alcohol. Some people have experienced increased drunkenness (intoxication), unusual or sometimes aggressive behavior, or no memory of things that have happened (amnesia) during treatment with this medication. You may do unusual sleep behaviors or activities you do not remember the day after taking this medication. Activities include driving, making or eating food, talking on the phone, sexual activity, or sleep walking. Stop taking this medication and call your care team right away if you find out you have done activities like this. Patients and their families should watch out for new or worsening depression or thoughts of suicide. Also watch out for sudden changes in feelings such as feeling anxious, agitated, panicky, irritable, hostile, aggressive, impulsive, severely restless, overly excited and hyperactive, or not being able to sleep. If this happens, call your care team. If you have diabetes, and you quit smoking, the effects of insulin may be increased. You may need to reduce your insulin dose. Check with your care team about how you should adjust your insulin dose. What side effects may I notice from receiving this medication? Side effects that you should report to your care team as soon as possible: Allergic reactions or angioedema--skin rash, itching or hives, swelling of the face, eyes, lips, tongue, arms, or legs, trouble swallowing or breathing Heart attack--pain or tightness in the chest, shoulders, arms, or jaw, nausea, shortness of breath, cold or clammy skin, feeling faint or lightheaded Mood and behavior changes--anxiety, nervousness, confusion, hallucinations, irritability, hostility, thoughts of suicide or self-harm, worsening mood, feelings of depression Redness, blistering, peeling, or loosening of the skin,  including inside the mouth Stroke--sudden numbness or weakness of the face, arm, or leg, trouble speaking, confusion, trouble walking, loss of balance or coordination, dizziness, severe headache, change in vision Seizures Side effects that usually do not require medical attention (report to your care team if they continue or are bothersome): Constipation Drowsiness Gas Nausea Trouble sleeping Upset stomach Vivid dreams or nightmares Vomiting This list may not describe all possible side effects. Call your doctor for medical advice about side effects. You may report side effects to FDA at 1-800-FDA-1088. Where should I keep my medication? Keep out of the reach of children and pets. Store at room temperature between 15 and 30 degrees C (59 and 86 degrees F). Throw away any unused medication after the expiration date. NOTE: This sheet is a summary. It may not cover all possible information. If you have questions about this medicine, talk to your doctor, pharmacist, or health care provider.  2023 Elsevier/Gold Standard (2021-10-28 00:00:00)  

## 2022-07-11 ENCOUNTER — Telehealth (INDEPENDENT_AMBULATORY_CARE_PROVIDER_SITE_OTHER): Payer: BC Managed Care – PPO | Admitting: Psychiatry

## 2022-07-11 ENCOUNTER — Encounter: Payer: Self-pay | Admitting: Psychiatry

## 2022-07-11 DIAGNOSIS — F4010 Social phobia, unspecified: Secondary | ICD-10-CM

## 2022-07-11 DIAGNOSIS — F17200 Nicotine dependence, unspecified, uncomplicated: Secondary | ICD-10-CM

## 2022-07-11 DIAGNOSIS — F3342 Major depressive disorder, recurrent, in full remission: Secondary | ICD-10-CM | POA: Diagnosis not present

## 2022-07-11 DIAGNOSIS — F411 Generalized anxiety disorder: Secondary | ICD-10-CM

## 2022-07-11 MED ORDER — NICOTINE 21 MG/24HR TD PT24: 21.0000 mg | MEDICATED_PATCH | Freq: Every day | TRANSDERMAL | 1 refills | Status: DC

## 2022-07-11 MED ORDER — LAMOTRIGINE 25 MG PO TABS: 75.0000 mg | ORAL_TABLET | Freq: Every day | ORAL | 1 refills | Status: DC

## 2022-07-11 NOTE — Progress Notes (Unsigned)
Virtual Visit via Video Note  I connected with Janice Gray on 07/11/22 at  3:40 PM EDT by a video enabled telemedicine application and verified that I am speaking with the correct person using two identifiers.  Location Provider Location : [facility] Patient Location : Home  Participants: Patient , Provider   I discussed the limitations of evaluation and management by telemedicine and the availability of in person appointments. The patient expressed understanding and agreed to proceed.   I discussed the assessment and treatment plan with the patient. The patient was provided an opportunity to ask questions and all were answered. The patient agreed with the plan and demonstrated an understanding of the instructions.   The patient was advised to call back or seek an in-person evaluation if the symptoms worsen or if the condition fails to improve as anticipated.   BH MD OP Progress Note  07/12/2022 6:32 AM Carlyann Placide  MRN:  [id number]  Chief Complaint:  Chief Complaint  Patient presents with   Follow-up: 41 year old Caucasian female with history of depression, anxiety, tobacco use disorder, presented for medication management.casian female with history of depression, anxiety, tobacco use disorder, presented for medication management.   HPI: Janice Gray is a 41 year old Caucasian female, married, employed, lives in [place], has a history of MDD, social anxiety disorder, GAD was evaluated by telemedicine today. year old Caucasian female, married, employed, lives in [place], has a history of MDD, social anxiety disorder, GAD was evaluated by telemedicine today.  Patient today reports after her last visit with writer on 04/13/2022, she started experiencing worsening mood symptoms, sadness, anxiety and hence decided to increase the Lamictal herself to a 75 mg from 50 mg.  Since doing so she reports her mood symptoms got better and currently she feels stable.  Denies any significant depression symptoms.  Does have anxiety, calls herself a worrier however has been able to cope and it does not overwhelm her.  Reports sleep as good.  Reports appetite is fair.  Reports concentration and focus is good.  She has been able to do okay at work.  Was  able to take a break to the beach and enjoyed it.  Denies any suicidality, homicidality or perceptual disturbances.  Tried Chantix for smoking cessation however that gave her GI side effects and hence had to stop.  The nicotine patch at this dosage does not seem to be helpful.  Receptive to counseling.  Interested in dosage increase of the nicotine patch.  Denies any other concerns today.    Visit Diagnosis:    ICD-10-CM   1. MDD (major depressive disorder), recurrent, in full remission (HCC)  F33.42 lamoTRIgine (LAMICTAL) 25 MG tablet    2. Social anxiety disorder  F40.10     3. GAD (generalized anxiety disorder)  F41.1     4. Tobacco use disorder  F17.200 nicotine (NICODERM CQ) 21 mg/24hr patch      Past Psychiatric History: Reviewed past psychiatric history from progress note on 03/19/2018.  Past trials of medications like Abilify-light sensitivity, Seroquel-groggy, Wellbutrin-increased anxiety, Xanax.  Past Medical History:  Past Medical History:  Diagnosis Date   Anxiety    Depression    Headache    sinus   History of kidney stones    Microscopic hematuria    Motion sickness    cars   Wears contact lenses     Past Surgical History:  Procedure Laterality Date   BREAST SURGERY     Implants   ENDOSCOPIC CONCHA BULLOSA RESECTION Right 11/17/2016   Procedure: ENDOSCOPIC CONCHA BULLOSA RESECTION;  Surgeon: Vernie Murders, MD;  Location: [facility];  Service: ENT;  Laterality: Right;   MANDIBLE FRACTURE SURGERY     SEPTOPLASTY  N/A 11/17/2016   Procedure: SEPTOPLASTY;  Surgeon: Vernie Murders, MD;  Location[facility];  Service: ENT;  Laterality: N/A;   TUBAL LIGATION Bilateral 10/26/2018   TURBINATE REDUCTION Bilateral 11/17/2016   Procedure: PARTIAL INFERIOR TURBINATE REDUCTION;  Surgeon: Vernie Murders, MD;  Location: [facility];  Service: ENT;  Laterality: Bilateral;   Wisdom Teeth Surgery      Family Psychiatric History: Reviewed family  psychiatric history from progress note on 03/19/2018.  Family History:  Family History  Problem Relation Age of Onset   Kidney disease Neg Hx    Bladder Cancer Neg Hx     Social History: Reviewed social history from progress note on 03/19/2018. Social History   Socioeconomic History   Marital status: Married    Spouse name: franklin   Number of children: 2   Years of education: Not on file   Highest education level: Some college, no degree  Occupational History    Comment: fulltime  Tobacco Use   Smoking status: Every Day    Packs/day: 0.50    Years: 15.00    Total pack years: 7.50    Types: E-cigarettes, Cigarettes    Start date: 06/28/2000   Smokeless tobacco: Never  Vaping Use   Vaping Use: Never used  Substance and Sexual Activity   Alcohol use: Yes    Alcohol/week: 0.0 standard drinks of alcohol    Comment: special occ./ social   Drug use: No   Sexual activity: Yes    Partners: Male    Birth control/protection: None, Surgical, Injection  Other Topics Concern   Not on file  Social History Narrative   Not on file   Social Determinants of Health   Financial Resource Strain: Medium Risk (04/17/2018)   Overall Financial Resource Strain (CARDIA)    Difficulty of Paying Living Expenses: Somewhat hard  Food Insecurity: No Food Insecurity (04/17/2018)   Hunger Vital Sign    Worried About Running Out of Food in the Last Year: Never true    Ran Out of Food in the Last Year: Never true  Transportation Needs: No Transportation Needs (04/17/2018)   PRAPARE - Administrator, Civil Service (Medical): No    Lack of Transportation (Non-Medical): No  Physical Activity: Inactive (04/17/2018)   Exercise Vital Sign    Days of Exercise per Week: 0 days    Minutes of Exercise per Session: 0 min  Stress: Stress Concern Present (04/17/2018)   [employer] of Occupational Health - Occupational Stress Questionnaire    Feeling of Stress : Very much  Social Connections:  Somewhat Isolated (04/17/2018)   Social Connection and Isolation Panel [NHANES]    Frequency of Communication with Friends and Family: Three times a week    Frequency of Social Gatherings with Friends and Family: Twice a week    Attends Religious Services: Never    Database administrator or Organizations: No    Attends Banker Meetings: Never    Marital Status: Married    Allergies:  Allergies  Allergen Reactions   Abilify [Aripiprazole]     LIGHT SENSITIVITY    Metabolic Disorder Labs: No results found for: "HGBA1C", "MPG" No results found for: "PROLACTIN" Lab Results  Component Value Date   CHOL 150 05/25/2015   TRIG 75 05/25/2015   HDL 48 05/25/2015   LDLCALC 87 05/25/2015   Lab Results  Component Value Date   TSH 1.866 03/30/2015    Therapeutic Level Labs: No results found  for: "LITHIUM" No results found for: "VALPROATE" No results found for: "CBMZ"  Current Medications: Current Outpatient Medications  Medication Sig Dispense Refill   cetirizine (ZYRTEC) 10 MG tablet Take by mouth.     fluconazole (DIFLUCAN) 150 MG tablet Take 150 mg by mouth once.     fluticasone (FLONASE) 50 MCG/ACT nasal spray Place 2 sprays into both nostrils daily.     hydrOXYzine (VISTARIL) 25 MG capsule Take 1-2 capsules (25-50 mg total) by mouth 3 times/day as needed-between meals & bedtime for anxiety (sleep). 180 capsule 1   medroxyPROGESTERone Acetate 150 MG/ML SUSY Inject 1 mL into the muscle every 3 (three) months.     naproxen sodium (ALEVE) 220 MG tablet Take by mouth.     nicotine (NICODERM CQ) 21 mg/24hr patch Place 1 patch (21 mg total) onto the skin daily. 28 patch 1   PREVIDENT 5000 DRY MOUTH 1.1 % GEL dental gel      sertraline (ZOLOFT) 50 MG tablet TAKE 1 TABLET BY MOUTH DAILY 90 tablet 1   lamoTRIgine (LAMICTAL) 25 MG tablet Take 3 tablets (75 mg total) by mouth daily. 270 tablet 1   No current facility-administered medications for this visit.      Musculoskeletal: Strength & Muscle Tone:  UTA Gait & Station:  Seated Patient leans: N/A  Psychiatric Specialty Exam: Review of Systems  Psychiatric/Behavioral: Negative.    All other systems reviewed and are negative.   There were no vitals taken for this visit.There is no height or weight on file to calculate BMI.  General Appearance: Casual  Eye Contact:  Fair  Speech:  Clear and Coherent  Volume:  Normal  Mood:  Euthymic  Affect:  Congruent  Thought Process:  Goal Directed and Descriptions of Associations: Intact  Orientation:  Full (Time, Place, and Person)  Thought Content: Logical   Suicidal Thoughts:  No  Homicidal Thoughts:  No  Memory:  Immediate;   Fair Recent;   Fair Remote;   Fair  Judgement:  Fair  Insight:  Fair  Psychomotor Activity:  Normal  Concentration:  Concentration: Fair and Attention Span: Fair  Recall:  [employer] of Knowledge: Fair  Language: Fair  Akathisia:  No  Handed:  Right  AIMS (if indicated): done  Assets:  Communication Skills Desire for Improvement Housing Intimacy Talents/Skills Transportation  ADL's:  Intact  Cognition: WNL  Sleep:  Fair   Screenings: AIMS    Flowsheet Row Video Visit from 07/11/2022 in [facility] Video Visit from 04/13/2022 in [facility]  AIMS Total Score 0 0      GAD-7    Flowsheet Row Video Visit from 07/11/2022 in [facility] Office Visit from 12/23/2021 in [facility]  Total GAD-7 Score 4 6      PHQ2-9    Flowsheet Row Video Visit from 07/11/2022 in [facility] Video Visit from 04/13/2022 in [facility] Office Visit from 12/23/2021 in [facility] Video Visit from 06/28/2021 in [facility] Video Visit from 05/13/2021 in [facility]  PHQ-2  Total Score 1 0 0 0 0      Flowsheet Row Video Visit from 07/11/2022 in [facility] Video Visit from 04/13/2022 in [facility] ED from 01/06/2022 in [facility]   C-SSRS RISK CATEGORY No Risk No Risk No Risk        Assessment  and Plan: Janice Gray is a 42 year old Caucasian female who has a history of MDD, anxiety, insomnia was evaluated by telemedicine today.  Patient is currently stable, interested in smoking cessation, failed /had side effects to Chantix.  Discussed plan as noted below.  Plan MDD in remission Zoloft 50 mg p.o. daily Lamotrigine 75 mg p.o. daily-dosage increase. Discontinue doxepin for noncompliance.   GAD-stable Zoloft as prescribed Continue hydroxyzine 25 mg to 50 mg by mouth 3 times a day as needed for anxiety and sleep.  Social anxiety disorder-stable Zoloft 50 mg p.o. daily.  Tobacco use disorder-unstable Discontinue Chantix for side effects. Increase nicotine patch to 21 mg per 24-hour. Provided information for [facility] - on APP store on your phone . Provided counseling for 5 minutes.  Follow-up in clinic in 3 months or sooner if needed.  This note was generated in part or whole with voice recognition software. Voice recognition is usually quite accurate but there are transcription errors that can and very often do occur. I apologize for any typographical errors that were not detected and corrected.    Consent: Patient/Guardian gives verbal consent for treatment and assignment of benefits for services provided during this visit. Patient/Guardian expressed understanding and agreed to proceed.   This note was generated in part or whole with voice recognition software. Voice recognition is usually quite accurate but there are transcription errors that can and very often do occur. I apologize for any typographical errors that were not detected and  corrected.      [name], MD 07/12/2022, 6:32 AM

## 2022-07-11 NOTE — Patient Instructions (Signed)
[facility] - on your APP STORE on your phone. For smoking cessation.

## 2022-09-09 ENCOUNTER — Other Ambulatory Visit: Payer: Self-pay | Admitting: Psychiatry

## 2022-09-09 DIAGNOSIS — F3342 Major depressive disorder, recurrent, in full remission: Secondary | ICD-10-CM

## 2022-09-09 DIAGNOSIS — F4010 Social phobia, unspecified: Secondary | ICD-10-CM

## 2022-09-09 DIAGNOSIS — F411 Generalized anxiety disorder: Secondary | ICD-10-CM

## 2022-10-17 ENCOUNTER — Encounter: Payer: Self-pay | Admitting: Psychiatry

## 2022-10-17 ENCOUNTER — Telehealth (INDEPENDENT_AMBULATORY_CARE_PROVIDER_SITE_OTHER): Payer: BC Managed Care – PPO | Admitting: Psychiatry

## 2022-10-17 DIAGNOSIS — F411 Generalized anxiety disorder: Secondary | ICD-10-CM | POA: Diagnosis not present

## 2022-10-17 DIAGNOSIS — F17200 Nicotine dependence, unspecified, uncomplicated: Secondary | ICD-10-CM

## 2022-10-17 DIAGNOSIS — F3342 Major depressive disorder, recurrent, in full remission: Secondary | ICD-10-CM

## 2022-10-17 DIAGNOSIS — F4010 Social phobia, unspecified: Secondary | ICD-10-CM | POA: Diagnosis not present

## 2022-10-17 NOTE — Progress Notes (Unsigned)
Virtual Visit via Video Note  I connected with Janice Gray on 10/17/22 at  3:40 PM EDT by a video enabled telemedicine application and verified that I am speaking with the correct person using two identifiers.  Location Provider Location : [facility] Patient Location : Home  Participants: Patient , Provider    I discussed the limitations of evaluation and management by telemedicine and the availability of in person appointments. The patient expressed understanding and agreed to proceed.    I discussed the assessment and treatment plan with the patient. The patient was provided an opportunity to ask questions and all were answered. The patient agreed with the plan and demonstrated an understanding of the instructions.   The patient was advised to call back or seek an in-person evaluation if the symptoms worsen or if the condition fails to improve as anticipated.   BH MD OP Progress Note  10/18/2022 12:55 PM Janice Gray  MRN:  [id number]  Chief Complaint:  Chief Complaint  Patient presents with   Follow-up   Anxiety   Depression   Medication Refill   HPI: Janice Gray is a 41 year old Caucasian female, married, employed, lives at [facility], has a history of MDD, social anxiety disorder, GAD was evaluated by telemedicine today.  Patient today reports overall she has been doing well.  Denies any significant mood lability.  Denies any depression symptoms.  Denies any significant anxiety.  Reports sleep is good.  Able to sleep through the night.  Denies any difficulty falling asleep or maintaining sleep.  Reports work is currently stressful.  With the holiday season coming up she has been doing extra work to get everything decorated and displayed.  She hence feels tired most days although she is aware that it is due to all the hard work she is doing.  She is happy that she gets 2 days off on weekends.  Patient reports her relationship with her spouse is better since he has also  gotten help for his mental health problems.  Currently trying to cut back on smoking cigarettes and using the nicotine patch.  Denies any suicidality, homicidality or perceptual disturbances.  Patient denies any other concerns today.  Visit Diagnosis:    ICD-10-CM   1. MDD (major depressive disorder), recurrent, in full remission (HCC)  F33.42     2. Social anxiety disorder  F40.10     3. GAD (generalized anxiety disorder)  F41.1     4. Tobacco use disorder  F17.200       Past Psychiatric History: Reviewed past psychiatric history from progress note on 03/19/2018.  Past trials of medications like Abilify-light sensitivity, Seroquel-groggy, Wellbutrin-increased anxiety, Xanax.  Past Medical History:  Past Medical History:  Diagnosis Date   Anxiety    Depression    Headache    sinus   History of kidney stones    Microscopic hematuria    Motion sickness    cars   Wears contact lenses     Past Surgical History:  Procedure Laterality Date   BREAST SURGERY     Implants   ENDOSCOPIC CONCHA BULLOSA RESECTION Right 11/17/2016   Procedure: ENDOSCOPIC CONCHA BULLOSA RESECTION;  Surgeon: Vernie Murders, MD;  Location: [facility];  Service: ENT;  Laterality: Right;   MANDIBLE FRACTURE SURGERY     SEPTOPLASTY N/A 11/17/2016   Procedure: SEPTOPLASTY;  Surgeon: Vernie Murders, MD;  Location: [facility];  Service: ENT;  Laterality: N/A;   TUBAL LIGATION Bilateral 10/26/2018   TURBINATE REDUCTION  Bilateral 11/17/2016   Procedure: PARTIAL INFERIOR TURBINATE REDUCTION;  Surgeon: Margaretha Sheffield, MD;  Location: [facility];  Service: ENT;  Laterality: Bilateral;   Wisdom Teeth Surgery      Family Psychiatric History: Reviewed family psychiatric history from progress note on 03/19/2018.  Family History:  Family History  Problem Relation Age of Onset   Depression Mother    Anxiety disorder Daughter    Depression Daughter    Post-traumatic stress disorder Daughter      Social History: Reviewed social history from progress note on 03/19/2018. Social History   Socioeconomic History   Marital status: Married    Spouse name: franklin   Number of children: 2   Years of education: Not on file   Highest education level: Some college, no degree  Occupational History    Comment: fulltime  Tobacco Use   Smoking status: Every Day    Packs/day: 0.25    Years: 15.00    Total pack years: 3.75    Types: E-cigarettes, Cigarettes    Start date: 06/28/2000   Smokeless tobacco: Never  Vaping Use   Vaping Use: Never used  Substance and Sexual Activity   Alcohol use: Yes    Alcohol/week: 0.0 standard drinks of alcohol    Comment: special occ./ social   Drug use: No   Sexual activity: Yes    Partners: Male    Birth control/protection: None, Surgical, Injection  Other Topics Concern   Not on file  Social History Narrative   Not on file   Social Determinants of Health   Financial Resource Strain: Medium Risk (04/17/2018)   Overall Financial Resource Strain (CARDIA)    Difficulty of Paying Living Expenses: Somewhat hard  Food Insecurity: No Food Insecurity (04/17/2018)   Hunger Vital Sign    Worried About Running Out of Food in the Last Year: Never true    Ran Out of Food in the Last Year: Never true  Transportation Needs: No Transportation Needs (04/17/2018)   PRAPARE - Hydrologist (Medical): No    Lack of Transportation (Non-Medical): No  Physical Activity: Inactive (04/17/2018)   Exercise Vital Sign    Days of Exercise per Week: 0 days    Minutes of Exercise per Session: 0 min  Stress: Stress Concern Present (04/17/2018)   [place]    Feeling of Stress : Very much  Social Connections: Somewhat Isolated (04/17/2018)   Social Connection and Isolation Panel [NHANES]    Frequency of Communication with Friends and Family: Three times a week    Frequency of  Social Gatherings with Friends and Family: Twice a week    Attends Religious Services: Never    Marine scientist or Organizations: No    Attends Archivist Meetings: Never    Marital Status: Married    Allergies:  Allergies  Allergen Reactions   Abilify [Aripiprazole]     LIGHT SENSITIVITY    Metabolic Disorder Labs: No results found for: "HGBA1C", "MPG" No results found for: "PROLACTIN" Lab Results  Component Value Date   CHOL 150 05/25/2015   TRIG 75 05/25/2015   HDL 48 05/25/2015   LDLCALC 87 05/25/2015   Lab Results  Component Value Date   TSH 1.866 03/30/2015    Therapeutic Level Labs: No results found for: "LITHIUM" No results found for: "VALPROATE" No results found for: "CBMZ"  Current Medications: Current Outpatient Medications  Medication Sig Dispense Refill  cetirizine (ZYRTEC) 10 MG tablet Take by mouth.     fluticasone (FLONASE) 50 MCG/ACT nasal spray Place 2 sprays into both nostrils daily.     hydrOXYzine (VISTARIL) 25 MG capsule Take 1-2 capsules (25-50 mg total) by mouth 3 times/day as needed-between meals & bedtime for anxiety (sleep). 180 capsule 1   lamoTRIgine (LAMICTAL) 25 MG tablet Take 3 tablets (75 mg total) by mouth daily. 270 tablet 1   medroxyPROGESTERone Acetate 150 MG/ML SUSY Inject 1 mL into the muscle every 3 (three) months.     naproxen sodium (ALEVE) 220 MG tablet Take by mouth.     nicotine (NICODERM CQ) 21 mg/24hr patch Place 1 patch (21 mg total) onto the skin daily. 28 patch 1   PREVIDENT 5000 DRY MOUTH 1.1 % GEL dental gel      sertraline (ZOLOFT) 50 MG tablet TAKE 1 TABLET BY MOUTH DAILY 90 tablet 1   fluconazole (DIFLUCAN) 150 MG tablet Take 150 mg by mouth once. (Patient not taking: Reported on 10/17/2022)     No current facility-administered medications for this visit.     Musculoskeletal: Strength & Muscle Tone:  UTA Gait & Station:  Seated Patient leans: N/A  Psychiatric Specialty Exam: Review of  Systems  Psychiatric/Behavioral: Negative.    All other systems reviewed and are negative.   There were no vitals taken for this visit.There is no height or weight on file to calculate BMI.  General Appearance: Casual  Eye Contact:  Fair  Speech:  Clear and Coherent  Volume:  Normal  Mood:  Euthymic  Affect:  Congruent  Thought Process:  Goal Directed and Descriptions of Associations: Intact  Orientation:  Full (Time, Place, and Person)  Thought Content: Logical   Suicidal Thoughts:  No  Homicidal Thoughts:  No  Memory:  Immediate;   Fair Recent;   Fair Remote;   Fair  Judgement:  Fair  Insight:  Fair  Psychomotor Activity:  Normal  Concentration:  Concentration: Fair and Attention Span: Fair  Recall:  [employer] of Knowledge: Fair  Language: Fair  Akathisia:  No  Handed:  Right  AIMS (if indicated): not done  Assets:  Communication Skills Desire for Improvement Housing Resilience Social Support Talents/Skills  ADL's:  Intact  Cognition: WNL  Sleep:  Fair   Screenings: AIMS    Flowsheet Row Video Visit from 07/11/2022 in [facility] Video Visit from 04/13/2022 in [facility]  AIMS Total Score 0 0      GAD-7    Flowsheet Row Video Visit from 10/17/2022 in [name] Video Visit from 07/11/2022 in [facility] Office Visit from 12/23/2021 in [facility]  Total GAD-7 Score 1 4 6       PHQ2-9    Flowsheet Row Video Visit from 10/17/2022 in [facility] Video Visit from 07/11/2022 in [facility] Video Visit from 04/13/2022 in [facility] Office Visit from 12/23/2021 in [facility] Video Visit from 06/28/2021 in [facility]  PHQ-2 Total Score 0 1 0 0 0  PHQ-9 Total Score 2 -- -- -- --       Flowsheet Row Video Visit from 10/17/2022 in [facility] Video Visit from 07/11/2022 in [facility] Video Visit from 04/13/2022 in [facility]  C-SSRS RISK CATEGORY No Risk No Risk No Risk        Assessment  and Plan: Janice Gray is a 41 year old Caucasian female, has a history of MDD, anxiety, insomnia was evaluated by telemedicine today.  Patient is currently stable.  Plan as noted below.  Plan MDD in remission Zoloft 50 mg p.o. daily Lamictal 75 mg p.o. daily.  GAD-stable Zoloft as prescribed Hydroxyzine 25-50 mg up to 3 times a day as needed for anxiety, sleep.  Social anxiety disorder-stable Zoloft 50 mg p.o. daily.  Tobacco use disorder-improving Continue nicotine patch 21 mcg per 24 hours. Provided counseling for 1 minute.   Follow-up in clinic in 3 to 4 months or sooner if needed.   Consent: Patient/Guardian gives verbal consent for treatment and assignment of benefits for services provided during this visit. Patient/Guardian expressed understanding and agreed to proceed.   This note was generated in part or whole with voice recognition software. Voice recognition is usually quite accurate but there are transcription errors that can and very often do occur. I apologize for any typographical errors that were not detected and corrected.      [name], MD 10/18/2022, 12:55 PM

## 2022-11-22 ENCOUNTER — Other Ambulatory Visit: Payer: Self-pay | Admitting: Psychiatry

## 2022-11-22 DIAGNOSIS — F3342 Major depressive disorder, recurrent, in full remission: Secondary | ICD-10-CM

## 2023-03-07 ENCOUNTER — Telehealth (INDEPENDENT_AMBULATORY_CARE_PROVIDER_SITE_OTHER): Payer: BC Managed Care – PPO | Admitting: Psychiatry

## 2023-03-07 ENCOUNTER — Encounter: Payer: Self-pay | Admitting: Psychiatry

## 2023-03-07 DIAGNOSIS — F17200 Nicotine dependence, unspecified, uncomplicated: Secondary | ICD-10-CM

## 2023-03-07 DIAGNOSIS — F3342 Major depressive disorder, recurrent, in full remission: Secondary | ICD-10-CM

## 2023-03-07 DIAGNOSIS — F17210 Nicotine dependence, cigarettes, uncomplicated: Secondary | ICD-10-CM | POA: Diagnosis not present

## 2023-03-07 DIAGNOSIS — F4010 Social phobia, unspecified: Secondary | ICD-10-CM | POA: Diagnosis not present

## 2023-03-07 DIAGNOSIS — F411 Generalized anxiety disorder: Secondary | ICD-10-CM | POA: Diagnosis not present

## 2023-03-07 NOTE — Progress Notes (Unsigned)
Virtual Visit via Video Note  I connected with Janice Gray on 03/07/23 at  3:00 PM EDT by a video enabled telemedicine application and verified that I am speaking with the correct person using two identifiers.  Location Provider Location : [facility] Patient Location : Home  Participants: Patient , Provider   I discussed the limitations of evaluation and management by telemedicine and the availability of in person appointments. The patient expressed understanding and agreed to proceed.   I discussed the assessment and treatment plan with the patient. The patient was provided an opportunity to ask questions and all were answered. The patient agreed with the plan and demonstrated an understanding of the instructions.   The patient was advised to call back or seek an in-person evaluation if the symptoms worsen or if the condition fails to improve as anticipated.   [facility] MD OP Progress Note  03/09/2023 8:26 AM Karel Mowers  MRN:  [id number]  Chief Complaint:  Chief Complaint  Patient presents with   Follow-up   Anxiety   Medication Refill   HPI: Janice Gray is a 42 year old Caucasian female, married, employed, lives at [facility], has a history of MDD, social anxiety, GAD, tobacco use disorder was evaluated by telemedicine today.  Patient is status post hysterectomy-02/27/2023 currently recovering.  Reports she is currently on FMLA for the next 6 weeks from work.  Patient is currently on medications to help with her recovery, pain including oxycodone, Zofran as needed.  Does report having good support system from her spouse.  Currently denies any significant anxiety or depression symptoms.  Currently compliant on medications, reports medications as effective.  Denies any sleep problems.  Patient denies any suicidality, homicidality or perceptual disturbances.  Patient reports appetite is fair.  Patient denies any other concerns today.  Visit Diagnosis:    ICD-10-CM   1. MDD (major  depressive disorder), recurrent, in full remission ([place])  F33.42     2. Social anxiety disorder  F40.10     3. GAD (generalized anxiety disorder)  F41.1     4. Tobacco use disorder  F17.200       Past Psychiatric History: I have reviewed past psychiatric history from progress note on 03/19/2018.  Past also medications like Abilify-light sensitivity, Seroquel-groggy, Wellbutrin-increased anxiety, Xanax.  Past Medical History:  Past Medical History:  Diagnosis Date   Anxiety    Depression    Headache    sinus   History of kidney stones    Microscopic hematuria    Motion sickness    cars   Wears contact lenses     Past Surgical History:  Procedure Laterality Date   BREAST SURGERY     Implants   ENDOSCOPIC CONCHA BULLOSA RESECTION Right 11/17/2016   Procedure: ENDOSCOPIC CONCHA BULLOSA RESECTION;  Surgeon: Margaretha Sheffield, MD;  Location: [place];  Service: ENT;  Laterality: Right;   MANDIBLE FRACTURE SURGERY     SEPTOPLASTY N/A 11/17/2016   Procedure: SEPTOPLASTY;  Surgeon: Margaretha Sheffield, MD;  Location: [place];  Service: ENT;  Laterality: N/A;   TUBAL LIGATION Bilateral 10/26/2018   TURBINATE REDUCTION Bilateral 11/17/2016   Procedure: PARTIAL INFERIOR TURBINATE REDUCTION;  Surgeon: Margaretha Sheffield, MD;  Location: [place];  Service: ENT;  Laterality: Bilateral;   Wisdom Teeth Surgery      Family Psychiatric History: Reviewed family psychiatric history from progress note on 03/19/2018.  Family History:  Family History  Problem Relation Age of Onset   Depression Mother  Anxiety disorder Daughter    Depression Daughter    Post-traumatic stress disorder Daughter     Social History: Reviewed social history from progress note on 03/19/2018. Social History   Socioeconomic History   Marital status: Married    Spouse name: franklin   Number of children: 2   Years of education: Not on file   Highest education level: Some college, no degree   Occupational History    Comment: fulltime  Tobacco Use   Smoking status: Every Day    Packs/day: 0.25    Years: 15.00    Additional pack years: 0.00    Total pack years: 3.75    Types: E-cigarettes, Cigarettes    Start date: 06/28/2000   Smokeless tobacco: Never  Vaping Use   Vaping Use: Never used  Substance and Sexual Activity   Alcohol use: Yes    Alcohol/week: 0.0 standard drinks of alcohol    Comment: special occ./ social   Drug use: No   Sexual activity: Yes    Partners: Male    Birth control/protection: None, Surgical, Injection  Other Topics Concern   Not on file  Social History Narrative   Not on file   Social Determinants of Health   Financial Resource Strain: Medium Risk (04/17/2018)   Overall Financial Resource Strain (CARDIA)    Difficulty of Paying Living Expenses: Somewhat hard  Food Insecurity: No Food Insecurity (04/17/2018)   Hunger Vital Sign    Worried About Running Out of Food in the Last Year: Never true    Ran Out of Food in the Last Year: Never true  Transportation Needs: No Transportation Needs (04/17/2018)   PRAPARE - Hydrologist (Medical): No    Lack of Transportation (Non-Medical): No  Physical Activity: Inactive (04/17/2018)   Exercise Vital Sign    Days of Exercise per Week: 0 days    Minutes of Exercise per Session: 0 min  Stress: Stress Concern Present (04/17/2018)   [place]    Feeling of Stress : Very much  Social Connections: Somewhat Isolated (04/17/2018)   Social Connection and Isolation Panel [NHANES]    Frequency of Communication with Friends and Family: Three times a week    Frequency of Social Gatherings with Friends and Family: Twice a week    Attends Religious Services: Never    Marine scientist or Organizations: No    Attends Archivist Meetings: Never    Marital Status: Married    Allergies:  Allergies   Allergen Reactions   Abilify [Aripiprazole]     LIGHT SENSITIVITY    Metabolic Disorder Labs: No results found for: "HGBA1C", "MPG" No results found for: "PROLACTIN" Lab Results  Component Value Date   CHOL 150 05/25/2015   TRIG 75 05/25/2015   HDL 48 05/25/2015   LDLCALC 87 05/25/2015   Lab Results  Component Value Date   TSH 1.866 03/30/2015    Therapeutic Level Labs: No results found for: "LITHIUM" No results found for: "VALPROATE" No results found for: "CBMZ"  Current Medications: Current Outpatient Medications  Medication Sig Dispense Refill   Docusate Sodium (DSS) 100 MG CAPS Take by mouth.     ibuprofen (ADVIL) 800 MG tablet Take 800 mg by mouth every 6 (six) hours as needed.     ondansetron (ZOFRAN-ODT) 4 MG disintegrating tablet Take 4 mg by mouth every 8 (eight) hours as needed.     oxyCODONE (OXY IR/ROXICODONE)  5 MG immediate release tablet Take 5 mg by mouth every 4 (four) hours as needed.     polyethylene glycol powder (GLYCOLAX/MIRALAX) 17 GM/SCOOP powder Take by mouth.     cetirizine (ZYRTEC) 10 MG tablet Take by mouth.     fluconazole (DIFLUCAN) 150 MG tablet Take 150 mg by mouth once. (Patient not taking: Reported on 10/17/2022)     fluticasone (FLONASE) 50 MCG/ACT nasal spray Place 2 sprays into both nostrils daily.     hydrOXYzine (VISTARIL) 25 MG capsule Take 1-2 capsules (25-50 mg total) by mouth 3 times/day as needed-between meals & bedtime for anxiety (sleep). 180 capsule 1   lamoTRIgine (LAMICTAL) 25 MG tablet TAKE 3 TABLETS BY MOUTH DAILY 270 tablet 3   medroxyPROGESTERone Acetate 150 MG/ML SUSY Inject 1 mL into the muscle every 3 (three) months.     naproxen sodium (ALEVE) 220 MG tablet Take by mouth.     nicotine (NICODERM CQ) 21 mg/24hr patch Place 1 patch (21 mg total) onto the skin daily. 28 patch 1   PREVIDENT 5000 DRY MOUTH 1.1 % GEL dental gel      sertraline (ZOLOFT) 50 MG tablet TAKE 1 TABLET BY MOUTH DAILY 90 tablet 1   No current  facility-administered medications for this visit.     Musculoskeletal: Strength & Muscle Tone:  UTA Gait & Station:  Seated Patient leans: N/A  Psychiatric Specialty Exam: Review of Systems  Genitourinary:  Positive for pelvic pain (s.p surgery).  All other systems reviewed and are negative.   There were no vitals taken for this visit.There is no height or weight on file to calculate BMI.  General Appearance: Casual  Eye Contact:  Fair  Speech:  Normal Rate  Volume:  Normal  Mood:  Anxious  Affect:  Congruent  Thought Process:  Goal Directed and Descriptions of Associations: Intact  Orientation:  Full (Time, Place, and Person)  Thought Content: Logical   Suicidal Thoughts:  No  Homicidal Thoughts:  No  Memory:  Immediate;   Fair Recent;   Fair Remote;   Fair  Judgement:  Fair  Insight:  Fair  Psychomotor Activity:  Normal  Concentration:  Concentration: Fair and Attention Span: Fair  Recall:  [employer] of Knowledge: Fair  Language: Fair  Akathisia:  No  Handed:  Right  AIMS (if indicated): not done  Assets:  Communication Skills Desire for Improvement Housing Social Support  ADL's:  Intact  Cognition: WNL  Sleep:  Fair   Screenings: AIMS    Flowsheet Row Video Visit from 07/11/2022 in [facility] Video Visit from 04/13/2022 in [facility] Total Score 0 0      GAD-7    Flowsheet Row Video Visit from 10/17/2022 in [facility] Video Visit from 07/11/2022 in [facility] Office Visit from 12/23/2021 in [facility]  Total GAD-7 Score 1 4 6       PHQ2-9    Flowsheet Row Video Visit from 03/07/2023 in [facility] Video Visit from 10/17/2022 in [facility] Video Visit from  07/11/2022 in [facility] Video Visit from 04/13/2022 in [facility] Office Visit from 12/23/2021 in [facility]  PHQ-2 Total Score 0 0 1 0 0  PHQ-9 Total Score -- 2 -- -- --  Flowsheet Row Video Visit from 03/07/2023 in [facility] Video Visit from 10/17/2022 in [facility] Video Visit from 07/11/2022 in [facility]  C-SSRS RISK CATEGORY No Risk No Risk No Risk        Assessment and Plan: Janice Gray is a 42 year old Caucasian female, has a history of MDD, anxiety, insomnia was evaluated by telemedicine today.  Patient is currently status post hysterectomy, recovering, mood symptoms are stable on current medication regimen.  Plan as noted below.  Plan MDD in remission Zoloft 50 mg p.o. daily Lamictal 75 mg p.o. daily  GAD-stable Zoloft as prescribed Hydroxyzine 25-50 mg up to 3 times a day as needed for anxiety, sleep.  Social anxiety disorder-stable Zoloft 50 mg p.o. daily  Tobacco use disorder-improving Nicotine patch 21 mcg per 24 hours Patient encouraged to work on cutting back.  Follow-up in clinic in 3 to 4 months or sooner in person.    Consent: Patient/Guardian gives verbal consent for treatment and assignment of benefits for services provided during this visit. Patient/Guardian expressed understanding and agreed to proceed.   This note was generated in part or whole with voice recognition software. Voice recognition is usually quite accurate but there are transcription errors that can and very often do occur. I apologize for any typographical errors that were not detected and corrected.    Ursula Alert, MD 03/09/2023, 8:26 AM

## 2023-05-10 ENCOUNTER — Other Ambulatory Visit: Payer: Self-pay | Admitting: Psychiatry

## 2023-05-10 DIAGNOSIS — F411 Generalized anxiety disorder: Secondary | ICD-10-CM

## 2023-05-10 DIAGNOSIS — F3342 Major depressive disorder, recurrent, in full remission: Secondary | ICD-10-CM

## 2023-05-10 DIAGNOSIS — F4010 Social phobia, unspecified: Secondary | ICD-10-CM

## 2023-05-26 ENCOUNTER — Ambulatory Visit: Payer: BC Managed Care – PPO

## 2023-05-30 ENCOUNTER — Ambulatory Visit
Admission: EM | Admit: 2023-05-30 | Discharge: 2023-05-30 | Disposition: A | Discharge status: Home / Self Care | Payer: BC Managed Care – PPO | Attending: Emergency Medicine | Admitting: Emergency Medicine

## 2023-05-30 DIAGNOSIS — R109 Unspecified abdominal pain: Secondary | ICD-10-CM | POA: Diagnosis not present

## 2023-05-30 DIAGNOSIS — N200 Calculus of kidney: Secondary | ICD-10-CM | POA: Diagnosis not present

## 2023-05-30 LAB — URINALYSIS, W/ REFLEX TO CULTURE (INFECTION SUSPECTED)
Bilirubin Urine: NEGATIVE
Glucose, UA: NEGATIVE mg/dL
Ketones, ur: NEGATIVE mg/dL
Leukocytes,Ua: NEGATIVE
Nitrite: NEGATIVE
Protein, ur: NEGATIVE mg/dL
Specific Gravity, Urine: 1.015 (ref 1.005–1.030)
pH: 5.5 (ref 5.0–8.0)

## 2023-05-30 MED ORDER — KETOROLAC TROMETHAMINE 10 MG PO TABS: 10.0000 mg | ORAL_TABLET | Freq: Four times a day (QID) | ORAL | 0 refills | Status: AC | PRN

## 2023-05-30 NOTE — Discharge Instructions (Addendum)
Take toradol as directed. Drink plenty of water, avoid caffeine. Follow up with PCP. If pain persist,worsens or you develop fever, please go to ER for further evaluation(labs imaging,etc). Return as needed.

## 2023-05-30 NOTE — ED Triage Notes (Signed)
Patient presents to [facility] for abdominal and back pain since last Thursday. Concerned with possible kidney stone. Taking 800 mg Ibuprofen for pain.

## 2023-05-30 NOTE — ED Provider Notes (Addendum)
[facility]    CSN: [id number] Arrival date & time: 05/30/23  1405      History   Chief Complaint Chief Complaint  Patient presents with   Abdominal Pain    Possible kidney stone - Entered by patient    HPI Janice Gray is a 42 y.o. female.   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  Patient states she has taken ibuprofen which has helped.  Patient denies any fever nausea vomiting or diarrhea at present . Patient reports past medical history of kidney stone.  The history is provided by the patient. No language interpreter was used.    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   Chronic seasonal allergic rhinitis due to pollen 08/02/2016   Major depressive disorder, recurrent episode, moderate ([facility]CC) 03/30/2016   Social anxiety disorder 03/30/2016   Irritable bowel syndrome with constipation 11/24/2015   Kidney stone 11/24/2015   Anxiety 11/04/2015   Cigarette smoker 11/04/2015   Ureteral stone 08/30/2015   Hydronephrosis with urinary obstruction due to ureteral calculus 08/30/2015   Microscopic hematuria 08/30/2015   History of nephrolithiasis 08/30/2015   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/28/2015   Depression, major, recurrent, moderate (HCC) 08/28/2015   Phobia, 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   Clinical depression 06/29/2015    Past Surgical  History:  Procedure Laterality Date   BREAST SURGERY     Implants   ENDOSCOPIC CONCHA BULLOSA RESECTION Right 11/17/2016   Procedure: ENDOSCOPIC CONCHA BULLOSA RESECTION;  Surgeon: Vernie Murders, MD;  Location: [facility];  Service: ENT;  Laterality: Right;   MANDIBLE FRACTURE SURGERY     SEPTOPLASTY N/A 11/17/2016   Procedure: SEPTOPLASTY;  Surgeon: Vernie Murders, MD;  Location: [facility];  Service: ENT;  Laterality: N/A;   TUBAL LIGATION Bilateral 10/26/2018   TURBINATE REDUCTION Bilateral 11/17/2016   Procedure: PARTIAL INFERIOR TURBINATE REDUCTION;  Surgeon: Vernie Murders, MD;  Location: [facility];  Service: ENT;  Laterality: Bilateral;   Wisdom Teeth Surgery      OB History   No obstetric history on file.      Home Medications    Prior to Admission medications   Medication Sig Start Date End Date Taking? Authorizing Provider  ketorolac (TORADOL) 10 MG tablet Take 1 tablet (10 mg total) by mouth every 6 (six) hours as needed for up to 3 days. 05/30/23 06/02/23 Yes Saladin Petrelli, Para March, NP  cetirizine (ZYRTEC) 10 MG tablet Take by mouth.    [provider]  fluconazole (DIFLUCAN) 150 MG tablet Take 150 mg by mouth once. Patient not taking: Reported on 10/17/2022 04/07/21   [provider]  fluticasone (FLONASE) 50 MCG/ACT nasal spray Place 2 sprays into both nostrils daily.    [provider]  hydrOXYzine (VISTARIL) 25 MG capsule Take 1-2 capsules (25-50 mg total) by  mouth 3 times/day as needed-between meals & bedtime for anxiety (sleep). 05/21/18   [name], MD  ibuprofen (ADVIL) 800 MG tablet Take 800 mg by mouth every 6 (six) hours as needed. 02/23/23   [provider]  lamoTRIgine (LAMICTAL) 25 MG tablet TAKE 3 TABLETS BY MOUTH DAILY 11/22/22   [name], MD  medroxyPROGESTERone Acetate 150 MG/ML SUSY Inject 1 mL into the muscle every 3 (three) months. 08/03/20   [provider]  nicotine (NICODERM CQ)  21 mg/24hr patch Place 1 patch (21 mg total) onto the skin daily. 07/11/22   [name], MD  ondansetron (ZOFRAN-ODT) 4 MG disintegrating tablet Take 4 mg by mouth every 8 (eight) hours as needed. 02/27/23   [provider]  oxyCODONE (OXY IR/ROXICODONE) 5 MG immediate release tablet Take 5 mg by mouth every 4 (four) hours as needed. 02/23/23   [provider]  polyethylene glycol powder (GLYCOLAX/MIRALAX) 17 GM/SCOOP powder Take by mouth. 02/23/23   [provider]  PREVIDENT 5000 DRY MOUTH 1.1 % GEL dental gel  05/18/19   [provider]  sertraline (ZOLOFT) 50 MG tablet TAKE 1 TABLET BY MOUTH DAILY 05/10/23   [name], MD    Family History Family History  Problem Relation Age of Onset   Depression Mother    Anxiety disorder Daughter    Depression Daughter    Post-traumatic stress disorder Daughter     Social History Social History   Tobacco Use   Smoking status: Every Day    Packs/day: 0.25    Years: 15.00    Additional pack years: 0.00    Total pack years: 3.75    Types: E-cigarettes, Cigarettes    Start date: 06/28/2000   Smokeless tobacco: Never  Vaping Use   Vaping Use: Never used  Substance Use Topics   Alcohol use: Yes    Alcohol/week: 0.0 standard drinks of alcohol    Comment: special occ./ social   Drug use: No     Allergies   Abilify [aripiprazole]   Review of Systems Review of Systems  Constitutional:  Negative for fever.  Gastrointestinal:  Positive for abdominal pain. Negative for diarrhea, nausea and vomiting.  Genitourinary:  Positive for flank pain. Negative for dysuria.  All other systems reviewed and are negative.    Physical Exam Triage Vital Signs ED Triage Vitals  Enc Vitals Group     BP      Pulse      Resp      Temp      Temp src      SpO2      Weight      Height      Head Circumference      Peak Flow      Pain Score      Pain Loc      Pain Edu?      Excl. in GC?    No data  found.  Updated Vital Signs BP 109/76 (BP Location: Left Arm)   Pulse 83   Temp 98.1 F (36.7 C) (Oral)   Resp 16   LMP  (LMP Unknown)   SpO2 100%   Visual Acuity Right Eye Distance:   Left Eye Distance:   Bilateral Distance:    Right Eye Near:   Left Eye Near:    Bilateral Near:     Physical Exam Vitals and nursing note reviewed.  Constitutional:      Appearance: Normal appearance. She is well-developed and well-groomed.  Cardiovascular:     Rate and Rhythm: Normal rate and regular rhythm.     Pulses: Normal pulses.     Heart sounds: Normal heart sounds.  Pulmonary:     Effort: Pulmonary effort is normal.     Breath sounds: Normal breath sounds and air entry.  Abdominal:     General: Bowel sounds are normal.     Palpations: Abdomen is soft.     Tenderness: There is no abdominal tenderness.  Musculoskeletal:     Comments: Negative CVAT bilaterally  Neurological:     General: No focal deficit present.     Mental Status: She is alert and oriented to person, place, and time.     GCS: GCS eye subscore is 4. GCS verbal subscore is 5. GCS motor subscore is 6.  Psychiatric:        Attention and Perception: Attention normal.        Mood and Affect: Mood normal.        Speech: Speech normal.        Behavior: Behavior normal. Behavior is cooperative.      UC Treatments / Results  Labs (all labs ordered are listed, but only abnormal results are displayed) Labs Reviewed  URINALYSIS, W/ REFLEX TO CULTURE (INFECTION SUSPECTED) - Abnormal; Notable for the following components:      Result Value   Hgb urine dipstick MODERATE (*)    Bacteria, UA RARE (*)    All other components within normal limits    EKG   Radiology No results found.  Procedures Procedures (including critical care time)  Medications Ordered in UC Medications - No data to display  Initial Impression / Assessment and Plan / UC Course  I have reviewed the triage vital signs and the nursing  notes.  Pertinent labs & imaging results that were available during my care of the patient were reviewed by me and considered in my medical decision making (see chart for details).    Discussed UA results today and plan of care, patient verbalized understanding this provider, will go to ER if symptoms persist or worsen.  Ddx: Kidney stone, renal colic , abdominal pain , diverticulitis,UTI Final Clinical Impressions(s) / UC Diagnoses   Final diagnoses:  Left flank pain  Kidney stone     Discharge Instructions      Take toradol as directed. Drink plenty of water, avoid caffeine. Follow up with PCP. If pain persist,worsens or you develop fever, please go to ER for further evaluation(labs imaging,etc). Return as needed.     ED Prescriptions     Medication Sig Dispense Auth. Provider   ketorolac (TORADOL) 10 MG tablet Take 1 tablet (10 mg total) by mouth every 6 (six) hours as needed for up to 3 days. 12 tablet Dejanay Wamboldt, Para March, NP      PDMP not reviewed this encounter.   Clancy Gourd, NP 05/30/23 1708    Clancy Gourd, NP 05/30/23 2020

## 2023-06-08 ENCOUNTER — Ambulatory Visit: Payer: BC Managed Care – PPO | Admitting: Psychiatry

## 2023-08-08 ENCOUNTER — Ambulatory Visit: Payer: BC Managed Care – PPO | Admitting: Psychiatry

## 2023-09-22 ENCOUNTER — Telehealth (INDEPENDENT_AMBULATORY_CARE_PROVIDER_SITE_OTHER): Payer: BC Managed Care – PPO | Admitting: Psychiatry

## 2023-09-22 ENCOUNTER — Encounter: Payer: Self-pay | Admitting: Psychiatry

## 2023-09-22 DIAGNOSIS — F411 Generalized anxiety disorder: Secondary | ICD-10-CM | POA: Diagnosis not present

## 2023-09-22 DIAGNOSIS — F17200 Nicotine dependence, unspecified, uncomplicated: Secondary | ICD-10-CM

## 2023-09-22 DIAGNOSIS — F17210 Nicotine dependence, cigarettes, uncomplicated: Secondary | ICD-10-CM | POA: Diagnosis not present

## 2023-09-22 DIAGNOSIS — F4010 Social phobia, unspecified: Secondary | ICD-10-CM | POA: Diagnosis not present

## 2023-09-22 DIAGNOSIS — F3342 Major depressive disorder, recurrent, in full remission: Secondary | ICD-10-CM

## 2023-09-22 MED ORDER — LAMOTRIGINE 25 MG PO TABS: 25.0000 mg | ORAL_TABLET | ORAL | Status: DC

## 2023-09-22 NOTE — Progress Notes (Signed)
Virtual Visit via Video Note  I connected with Janice Gray on 09/22/23 at  9:30 AM EDT by a video enabled telemedicine application and verified that I am speaking with the correct person using two identifiers.  Location Provider Location : [facility] Patient Location : Work  Participants: Patient , Provider   I discussed the limitations of evaluation and management by telemedicine and the availability of in person appointments. The patient expressed understanding and agreed to proceed.   I discussed the assessment and treatment plan with the patient. The patient was provided an opportunity to ask questions and all were answered. The patient agreed with the plan and demonstrated an understanding of the instructions.   The patient was advised to call back or seek an in-person evaluation if the symptoms worsen or if the condition fails to improve as anticipated.    BH MD OP Progress Note  09/22/2023 9:54 AM Janice Gray  MRN:  [id number]  Chief Complaint:  Chief Complaint  Patient presents with   Depression   Follow-up   Anxiety   Medication Refill   HPI: Janice Gray is a 42 year old Caucasian female, married, employed, lives at [facility], has a history of MDD, GAD, social anxiety disorder, tobacco use disorder was evaluated by telemedicine today.  Patient today reports she has completely recovered from her hysterectomy in March 2024.  She is currently back at work full-time.  Patient reports she was able to get off of the medroxyprogesterone injections.  That has helped her a lot.  She reports she feels her mood symptoms are currently more stable and would like to come off of the Lamictal.  Patient reports anxiety is manageable on the sertraline.  Denies any significant depression symptoms.  Patient reports sleep as good.  Patient reports appetite is fair.  She no longer uses the nicotine patch.  Reports she has not been smoking much, has been able to cut back to 2  cigarettes/day.  Patient reports work is going well.  Denies any suicidality, homicidality or perceptual disturbances.  Patient denies any other concerns today.  Visit Diagnosis:    ICD-10-CM   1. MDD (major depressive disorder), recurrent, in full remission (HCC)  F33.42 lamoTRIgine (LAMICTAL) 25 MG tablet    2. Social anxiety disorder  F40.10     3. GAD (generalized anxiety disorder)  F41.1     4. Tobacco use disorder  F17.200       Past Psychiatric History: I have reviewed past psychiatric history from progress note on 03/19/2018.  Past trials of medications like Abilify-light sensitivity, Seroquel-groggy, Wellbutrin-increased anxiety, Xanax.  Past Medical History:  Past Medical History:  Diagnosis Date   Anxiety    Depression    Headache    sinus   History of kidney stones    Microscopic hematuria    Motion sickness    cars   Wears contact lenses     Past Surgical History:  Procedure Laterality Date   BREAST SURGERY     Implants   ENDOSCOPIC CONCHA BULLOSA RESECTION Right 11/17/2016   Procedure: ENDOSCOPIC CONCHA BULLOSA RESECTION;  Surgeon: Vernie Murders, MD;  [facility];  Service: ENT;  Laterality: Right;   MANDIBLE FRACTURE SURGERY     SEPTOPLASTY N/A 11/17/2016   Procedure: SEPTOPLASTY;  Surgeon: Vernie Murders, MD;  Location: [facility];  Service: ENT;  Laterality: N/A;   TUBAL LIGATION Bilateral 10/26/2018   TURBINATE REDUCTION Bilateral 11/17/2016   Procedure: PARTIAL INFERIOR TURBINATE REDUCTION;  Surgeon: Renae Fickle  Elenore Rota, MD;  Location: [facility];  Service: ENT;  Laterality: Bilateral;   Wisdom Teeth Surgery      Family Psychiatric History: Reviewed family psychiatric history from progress note on 03/19/2018.  Family History:  Family History  Problem Relation Age of Onset   Depression Mother    Anxiety disorder Daughter    Depression Daughter    Post-traumatic stress disorder Daughter     Social History: Reviewed  social history from progress note on 03/19/2018. Social History   Socioeconomic History   Marital status: Married    Spouse name: franklin   Number of children: 2   Years of education: Not on file   Highest education level: Some college, no degree  Occupational History    Comment: fulltime  Tobacco Use   Smoking status: Every Day    Current packs/day: 0.25    Average packs/day: 0.3 packs/day for 23.2 years (5.8 ttl pk-yrs)    Types: E-cigarettes, Cigarettes    Start date: 06/28/2000   Smokeless tobacco: Never  Vaping Use   Vaping status: Never Used  Substance and Sexual Activity   Alcohol use: Yes    Alcohol/week: 0.0 standard drinks of alcohol    Comment: special occ./ social   Drug use: No   Sexual activity: Yes    Partners: Male    Birth control/protection: None, Surgical, Injection  Other Topics Concern   Not on file  Social History Narrative   Not on file   Social Determinants of Health   Financial Resource Strain: Medium Risk (04/17/2018)   Overall Financial Resource Strain (CARDIA)    Difficulty of Paying Living Expenses: Somewhat hard  Food Insecurity: No Food Insecurity (04/17/2018)   Hunger Vital Sign    Worried About Running Out of Food in the Last Year: Never true    Ran Out of Food in the Last Year: Never true  Transportation Needs: No Transportation Needs (04/17/2018)   PRAPARE - Administrator, Civil Service (Medical): No    Lack of Transportation (Non-Medical): No  Physical Activity: Inactive (04/17/2018)   Exercise Vital Sign    Days of Exercise per Week: 0 days    Minutes of Exercise per Session: 0 min  Stress: Stress Concern Present (04/17/2018)   [employer] of Occupational Health - Occupational Stress Questionnaire    Feeling of Stress : Very much  Social Connections: Somewhat Isolated (04/17/2018)   Social Connection and Isolation Panel [NHANES]    Frequency of Communication with Friends and Family: Three times a week    Frequency  of Social Gatherings with Friends and Family: Twice a week    Attends Religious Services: Never    Database administrator or Organizations: No    Attends Banker Meetings: Never    Marital Status: Married    Allergies:  Allergies  Allergen Reactions   Abilify [Aripiprazole]     LIGHT SENSITIVITY    Metabolic Disorder Labs: No results found for: "HGBA1C", "MPG" No results found for: "PROLACTIN" Lab Results  Component Value Date   CHOL 150 05/25/2015   TRIG 75 05/25/2015   HDL 48 05/25/2015   LDLCALC 87 05/25/2015   Lab Results  Component Value Date   TSH 1.866 03/30/2015    Therapeutic Level Labs: No results found for: "LITHIUM" No results found for: "VALPROATE" No results found for: "CBMZ"  Current Medications: Current Outpatient Medications  Medication Sig Dispense Refill   cetirizine (ZYRTEC) 10 MG tablet Take by mouth.  fluconazole (DIFLUCAN) 150 MG tablet Take 150 mg by mouth once.     fluticasone (FLONASE) 50 MCG/ACT nasal spray Place 2 sprays into both nostrils daily.     hydrOXYzine (VISTARIL) 25 MG capsule Take 1-2 capsules (25-50 mg total) by mouth 3 times/day as needed-between meals & bedtime for anxiety (sleep). 180 capsule 1   ibuprofen (ADVIL) 800 MG tablet Take 800 mg by mouth every 6 (six) hours as needed.     ondansetron (ZOFRAN-ODT) 4 MG disintegrating tablet Take 4 mg by mouth every 8 (eight) hours as needed.     PREVIDENT 5000 DRY MOUTH 1.1 % GEL dental gel      sertraline (ZOLOFT) 50 MG tablet TAKE 1 TABLET BY MOUTH DAILY 90 tablet 3   lamoTRIgine (LAMICTAL) 25 MG tablet Take 1-2 tablets (25-50 mg total) by mouth as directed. Take 1 tablet twice daily for 15 days and then 1 tablet daily.weaning off.     No current facility-administered medications for this visit.     Musculoskeletal: Strength & Muscle Tone:  UTA Gait & Station:  Seated Patient leans: N/A  Psychiatric Specialty Exam: Review of Systems   Psychiatric/Behavioral: Negative.      There were no vitals taken for this visit.There is no height or weight on file to calculate BMI.  General Appearance: Fairly Groomed  Eye Contact:  Fair  Speech:  Normal Rate  Volume:  Normal  Mood:  Euthymic  Affect:  Congruent  Thought Process:  Goal Directed and Descriptions of Associations: Intact  Orientation:  Full (Time, Place, and Person)  Thought Content: Logical   Suicidal Thoughts:  No  Homicidal Thoughts:  No  Memory:  Immediate;   Fair Recent;   Fair Remote;   Fair  Judgement:  Fair  Insight:  Fair  Psychomotor Activity:  Normal  Concentration:  Concentration: Fair and Attention Span: Fair  Recall:  [employer] of Knowledge: Fair  Language: Fair  Akathisia:  No  Handed:  Right  AIMS (if indicated): not done  Assets:  Communication Skills Desire for Improvement Housing Intimacy Social Support Talents/Skills Transportation  ADL's:  Intact  Cognition: WNL  Sleep:  Fair   Screenings: AIMS    Flowsheet Row Video Visit from 07/11/2022 in [facility] Video Visit from 04/13/2022 in [facility]  AIMS Total Score 0 0      GAD-7    Flowsheet Row Video Visit from 10/17/2022 in [facility] Video Visit from 07/11/2022 in [facility] Office Visit from 12/23/2021 in [facility]  Total GAD-7 Score 1 4 6       PHQ2-9    Flowsheet Row Video Visit from 03/07/2023 in [facility] Video Visit from 10/17/2022 in [facility] Video Visit from 07/11/2022 in [facility] Video Visit from 04/13/2022 in [facility] Office Visit from 12/23/2021 in [facility]  PHQ-2 Total Score 0 0 1 0 0  PHQ-9 Total Score -- 2 -- -- --      Flowsheet Row Video Visit from 09/22/2023 in [facility] ED from 05/30/2023 in [facility] at [facility]  Video Visit from 03/07/2023 in [facility]  C-SSRS RISK CATEGORY No Risk No Risk No Risk  Assessment and Plan: Janice Gray is a 42 year old Caucasian female who has a history of MDD, anxiety, insomnia was evaluated by telemedicine today.  Patient is currently stable on current medication regimen, interested in being tapered off of the Lamictal.  Plan as noted below.  Plan MDD in remission Zoloft 50 mg p.o. daily Reduce Lamictal to 25 mg twice daily for 2 weeks and then to 25 mg p.o. daily. Patient has supplies and she will make that change herself.  Patient to monitor for any worsening mood symptoms.  GAD-stable Zoloft 50 mg p.o. daily Hydroxyzine 25-50 mg up to 3 times a day as needed for anxiety and sleep.  She has been limiting use.  Social anxiety disorder-stable Zoloft 50 mg p.o. daily  Tobacco use disorder-improving Discontinue nicotine patch for noncompliance. Patient is currently cutting back.  Follow-up in clinic in 8 weeks or sooner in person. Consent: Patient/Guardian gives verbal consent for treatment and assignment of benefits for services provided during this visit. Patient/Guardian expressed understanding and agreed to proceed.   This note was generated in part or whole with voice recognition software. Voice recognition is usually quite accurate but there are transcription errors that can and very often do occur. I apologize for any typographical errors that were not detected and corrected.    [name], MD 09/22/2023, 9:54 AM

## 2023-11-14 ENCOUNTER — Encounter: Payer: Self-pay | Admitting: Psychiatry

## 2023-11-14 ENCOUNTER — Ambulatory Visit (INDEPENDENT_AMBULATORY_CARE_PROVIDER_SITE_OTHER): Payer: BC Managed Care – PPO | Admitting: Psychiatry

## 2023-11-14 VITALS — BP 122/82 | HR 79 | Temp 98.5°F | Ht 66.0 in | Wt 148.0 lb

## 2023-11-14 DIAGNOSIS — F4010 Social phobia, unspecified: Secondary | ICD-10-CM | POA: Diagnosis not present

## 2023-11-14 DIAGNOSIS — F411 Generalized anxiety disorder: Secondary | ICD-10-CM

## 2023-11-14 DIAGNOSIS — F17200 Nicotine dependence, unspecified, uncomplicated: Secondary | ICD-10-CM

## 2023-11-14 DIAGNOSIS — F3342 Major depressive disorder, recurrent, in full remission: Secondary | ICD-10-CM | POA: Diagnosis not present

## 2023-11-14 DIAGNOSIS — R0683 Snoring: Secondary | ICD-10-CM | POA: Insufficient documentation

## 2023-11-14 DIAGNOSIS — R41840 Attention and concentration deficit: Secondary | ICD-10-CM | POA: Insufficient documentation

## 2023-11-14 NOTE — Progress Notes (Unsigned)
BH MD OP Progress Note  11/14/2023 3:02 PM Janice Gray  MRN:  [id number]  Chief Complaint:  Chief Complaint  Patient presents with   Follow-up   Depression   Anxiety   Medication Refill   HPI: Janice Gray is a 42 year old Caucasian female, married, employed, lives at [facility], has a history of MDD, GAD, social anxiety disorder, tobacco use disorder was evaluated in office today.  Patient today reports she is currently monitoring whether she has ADHD.  She reports she has struggled with attention problems all her life and never thought she could get help with that.  Her friend was recently diagnosed with ADHD and she has googled her symptoms and currently believes a lot of her symptoms likely could be ADHD related.  Patient reports she struggles with lack of motivation, forgetfulness, inattention, absentmindedness, feels unmotivated a lot, struggles with low energy, fatigue all the time.  Patient reports she often feels she is unable to shut her mind down throughout the day.  She hence is interested in ADHD testing.  Patient reports she is able to focus on her work and is able to survive by doing the bare minimum.  She hence has not had any trouble at work due to her symptoms.  Patient reports sleep is overall okay.  She however does have snoring.  She does not feel rested when she wakes up in the morning and struggles with fatigue a lot.  She does not believe she has any apneic episodes or no one has observed it.  Patient however reports if she could she would sleep all the time. Epworth sleep scale  >10.  Patient today reports she was able to calm down on her Lamictal as discussed and is currently doing well on the lower dosage.  Interested in coming off of it.  Patient reports appetite is fair.  Patient continues to cut back on smoking and currently smokes 1 to 2 cigarettes/day.  Patient denies any suicidality, homicidality or perceptual disturbances.  Patient denies any other  concerns today. Visit Diagnosis:    ICD-10-CM   1. MDD (major depressive disorder), recurrent, in full remission (HCC)  F33.42     2. Social anxiety disorder  F40.10     3. GAD (generalized anxiety disorder)  F41.1     4. Tobacco use disorder  F17.200     5. Attention and concentration deficit  R41.840 Ambulatory referral to Neuropsychology    6. Snoring  R06.83 Ambulatory referral to Pulmonology      Past Psychiatric History: I have reviewed past psychiatric history from progress note on 03/19/2018.  Past trials of medications like Abilify-light sensitivity, Seroquel-groggy, Wellbutrin-increased anxiety, Xanax.  Past Medical History:  Past Medical History:  Diagnosis Date   Anxiety    Depression    Headache    sinus   History of kidney stones    Microscopic hematuria    Motion sickness    cars   Wears contact lenses     Past Surgical History:  Procedure Laterality Date   BREAST SURGERY     Implants   ENDOSCOPIC CONCHA BULLOSA RESECTION Right 11/17/2016   Procedure: ENDOSCOPIC CONCHA BULLOSA RESECTION;  Surgeon: Vernie Murders, MD;  Location: [facility];  Service: ENT;  Laterality: Right;   MANDIBLE FRACTURE SURGERY     SEPTOPLASTY N/A 11/17/2016   Procedure: SEPTOPLASTY;  Surgeon: Vernie Murders, MD;  Location[facility];  Service: ENT;  Laterality: N/A;   TUBAL LIGATION Bilateral 10/26/2018  TURBINATE REDUCTION Bilateral 11/17/2016   Procedure: PARTIAL INFERIOR TURBINATE REDUCTION;  Surgeon: Vernie Murders, MD;  Location: [facility];  Service: ENT;  Laterality: Bilateral;   Wisdom Teeth Surgery      Family Psychiatric History: I have reviewed family psychiatric history from progress note on 03/19/2018.  Family History:  Family History  Problem Relation Age of Onset   Depression Mother    Anxiety disorder Daughter    Depression Daughter    Post-traumatic stress disorder Daughter     Social History: I have reviewed social history from  progress note on 03/19/2018. Social History   Socioeconomic History   Marital status: Married    Spouse name: Janice Gray   Number of children: 2   Years of education: Not on file   Highest education level: Some college, no degree  Occupational History    Comment: fulltime  Tobacco Use   Smoking status: Every Day    Current packs/day: 0.25    Average packs/day: 0.3 packs/day for 23.4 years (5.8 ttl pk-yrs)    Types: E-cigarettes, Cigarettes    Start date: 06/28/2000   Smokeless tobacco: Never  Vaping Use   Vaping status: Never Used  Substance and Sexual Activity   Alcohol use: Yes    Alcohol/week: 0.0 standard drinks of alcohol    Comment: special occ./ social   Drug use: No   Sexual activity: Yes    Partners: Male    Birth control/protection: None, Surgical, Injection  Other Topics Concern   Not on file  Social History Narrative   Not on file   Social Determinants of Health   Financial Resource Strain: Medium Risk (04/17/2018)   Overall Financial Resource Strain (CARDIA)    Difficulty of Paying Living Expenses: Somewhat hard  Food Insecurity: No Food Insecurity (04/17/2018)   Hunger Vital Sign    Worried About Running Out of Food in the Last Year: Never true    Ran Out of Food in the Last Year: Never true  Transportation Needs: No Transportation Needs (04/17/2018)   PRAPARE - Administrator, Civil Service (Medical): No    Lack of Transportation (Non-Medical): No  Physical Activity: Inactive (04/17/2018)   Exercise Vital Sign    Days of Exercise per Week: 0 days    Minutes of Exercise per Session: 0 min  Stress: Stress Concern Present (04/17/2018)   [employer] of Occupational Health - Occupational Stress Questionnaire    Feeling of Stress : Very much  Social Connections: Somewhat Isolated (04/17/2018)   Social Connection and Isolation Panel [NHANES]    Frequency of Communication with Friends and Family: Three times a week    Frequency of Social Gatherings  with Friends and Family: Twice a week    Attends Religious Services: Never    Database administrator or Organizations: No    Attends Banker Meetings: Never    Marital Status: Married    Allergies:  Allergies  Allergen Reactions   Abilify [Aripiprazole]     LIGHT SENSITIVITY    Metabolic Disorder Labs: No results found for: "HGBA1C", "MPG" No results found for: "PROLACTIN" Lab Results  Component Value Date   CHOL 150 05/25/2015   TRIG 75 05/25/2015   HDL 48 05/25/2015   LDLCALC 87 05/25/2015   Lab Results  Component Value Date   TSH 1.866 03/30/2015    Therapeutic Level Labs: No results found for: "LITHIUM" No results found for: "VALPROATE" No results found for: "CBMZ"  Current Medications:  Current Outpatient Medications  Medication Sig Dispense Refill   cetirizine (ZYRTEC) 10 MG tablet Take by mouth.     fluconazole (DIFLUCAN) 150 MG tablet Take 150 mg by mouth once.     fluticasone (FLONASE) 50 MCG/ACT nasal spray Place 2 sprays into both nostrils daily.     hydrOXYzine (VISTARIL) 25 MG capsule Take 1-2 capsules (25-50 mg total) by mouth 3 times/day as needed-between meals & bedtime for anxiety (sleep). 180 capsule 1   ibuprofen (ADVIL) 800 MG tablet Take 800 mg by mouth every 6 (six) hours as needed.     ondansetron (ZOFRAN-ODT) 4 MG disintegrating tablet Take 4 mg by mouth every 8 (eight) hours as needed.     PREVIDENT 5000 DRY MOUTH 1.1 % GEL dental gel      sertraline (ZOLOFT) 50 MG tablet TAKE 1 TABLET BY MOUTH DAILY 90 tablet 3   No current facility-administered medications for this visit.     Musculoskeletal: Strength & Muscle Tone: within normal limits Gait & Station: normal Patient leans: N/A  Psychiatric Specialty Exam: Review of Systems  Psychiatric/Behavioral: Negative.      Blood pressure 122/82, pulse 79, temperature 98.5 F (36.9 C), temperature source Temporal, height 5\' 6"  (1.676 m), weight 148 lb (67.1 kg), SpO2 99%.Body  mass index is 23.89 kg/m.  General Appearance: Casual  Eye Contact:  Fair  Speech:  Normal Rate  Volume:  Normal  Mood:  Euthymic  Affect:  Congruent  Thought Process:  Goal Directed and Descriptions of Associations: Intact  Orientation:  Full (Time, Place, and Person)  Thought Content: Logical   Suicidal Thoughts:  No  Homicidal Thoughts:  No  Memory:  Immediate;   Fair Recent;   Fair Remote;   Fair  Judgement:  Fair  Insight:  Fair  Psychomotor Activity:  Normal  Concentration:  Concentration: Fair and Attention Span: Fair  Recall:  [employer] of Knowledge: Fair  Language: Fair  Akathisia:  No  Handed:  Right  AIMS (if indicated): not done  Assets:  Communication Skills Desire for Improvement Housing Intimacy Social Support Transportation  ADL's:  Intact  Cognition: WNL  Sleep:  Fair   Screenings: AIMS    Flowsheet Row Video Visit from 07/11/2022 in [facility] Video Visit from 04/13/2022 in [facility]  AIMS Total Score 0 0      GAD-7    Flowsheet Row Office Visit from 11/14/2023 in [facility] Video Visit from 10/17/2022 in [facility] Video Visit from 07/11/2022 in [facility] Office Visit from 12/23/2021 in [facility]  Total GAD-7 Score 3 1 4 6       PHQ2-9    Flowsheet Row Office Visit from 11/14/2023 in [facility] Video Visit from 03/07/2023 in [facility] Video Visit from 10/17/2022 in [facility] Video Visit from 07/11/2022 in [facility] Video Visit from 04/13/2022 in [facility]  PHQ-2 Total Score 1 0 0 1 0  PHQ-9  Total Score -- -- 2 -- --      Flowsheet Row Office Visit from 11/14/2023 in [facility] Video Visit from 09/22/2023 in [facility] ED from 05/30/2023 in [facility] at [facility] RISK  CATEGORY No Risk No Risk No Risk        Assessment and Plan: Janice Gray is a 42 year old Caucasian female who has a history of MDD, anxiety, insomnia was evaluated in office today.  Patient reports overall depression symptoms are stable although she continues to have anxiety mostly related to her having possible ADD/ADHD symptoms as noted above, interested in referral for testing.  Patient also with sleep issues/snoring/excessive fatigue-will benefit from sleep study referral, plan as noted below.  Plan MDD in remission Continue Zoloft 50 mg p.o. daily.  Patient tried higher dosages in the past which did not help. Discontinue Lamictal 25 mg based on patient preference.  GAD-improving Discussed increasing the dosage of Zoloft or changing to another SSRI or SNRI-patient declines. Continue Zoloft 50 mg p.o. daily. Hydroxyzine 25-50 mg up to 3 times a day as needed for anxiety/sleep.  Social anxiety disorder-stable Zoloft 50 mg p.o. daily  Tobacco use disorder-improving Provided counseling for 1 minute.  Attention and concentration deficit-Ambulatory referral to neuropsychology for testing.   Snoring-Ambulatory referral to pulmonology to rule out sleep apnea.     Collaboration of Care: Collaboration of Care: Other patient referred to neuropsychologist as well as pulmonology as noted above-encouraged to keep the appointment.  Patient/Guardian was advised Release of Information must be obtained prior to any record release in order to collaborate their care with an outside provider. Patient/Guardian was advised if they have not already done so to contact the registration department to sign all necessary  forms in order for [place] to release information regarding their care.   Consent: Patient/Guardian gives verbal consent for treatment and assignment of benefits for services provided during this visit. Patient/Guardian expressed understanding and agreed to proceed.   Follow-up in clinic in 6 months or sooner if needed.  This note was generated in part or whole with voice recognition software. Voice recognition is usually quite accurate but there are transcription errors that can and very often do occur. I apologize for any typographical errors that were not detected and corrected.    [name], MD 11/14/2023, 3:02 PM

## 2023-11-15 ENCOUNTER — Encounter: Payer: Self-pay | Admitting: Psychology

## 2023-11-23 ENCOUNTER — Other Ambulatory Visit: Payer: Self-pay | Admitting: Psychiatry

## 2023-11-23 DIAGNOSIS — F3342 Major depressive disorder, recurrent, in full remission: Secondary | ICD-10-CM

## 2023-12-06 ENCOUNTER — Telehealth: Payer: Self-pay | Admitting: Psychiatry

## 2023-12-06 NOTE — Telephone Encounter (Signed)
Received mychart message to cancel future appointment with provider, Dr. Elna Breslow and was wanting information regarding medical records. Message left on 12-06-23 that appointment was cancelled per the request and given Medical Records phone number to call and request records. Mychart message sent also.

## 2023-12-06 NOTE — Telephone Encounter (Signed)
Contacted patient to discuss that the evaluation report that we received has only self report scales and hence she still needs to get the ADHD testing completed for a diagnosis clarification for ADHD.  Patient became upset and hung up the phone.

## 2024-01-02 ENCOUNTER — Ambulatory Visit: Payer: BC Managed Care – PPO | Admitting: Internal Medicine

## 2024-01-02 ENCOUNTER — Encounter: Payer: Self-pay | Admitting: Internal Medicine

## 2024-01-02 VITALS — BP 110/60 | HR 80 | Temp 97.6°F | Ht 66.0 in | Wt 150.0 lb

## 2024-01-02 DIAGNOSIS — F17210 Nicotine dependence, cigarettes, uncomplicated: Secondary | ICD-10-CM | POA: Diagnosis not present

## 2024-01-02 DIAGNOSIS — R0683 Snoring: Secondary | ICD-10-CM

## 2024-01-02 DIAGNOSIS — G4719 Other hypersomnia: Secondary | ICD-10-CM | POA: Diagnosis not present

## 2024-01-02 NOTE — Progress Notes (Signed)
 Name: Janice Gray MRN: [id number] DOB: [birth year]    CHIEF COMPLAINT:  ASSESSMENT OF SLEEP APNEA Assessment of smoking cessation   HISTORY OF PRESENT ILLNESS: Patient has been having excessive daytime sleepiness for a long time Patient has been having extreme fatigue and tiredness, lack of energy + snoring every night + Nonrefreshing sleep  Discussed sleep data and reviewed with patient.  Encouraged proper weight management.  Discussed driving precautions and its relationship with hypersomnolence.  Discussed operating dangerous equipment and its relationship with hypersomnolence.  Discussed sleep hygiene, and benefits of a fixed sleep waked time.  The importance of getting eight or more hours of sleep discussed with patient.  Discussed limiting the use of the computer and television before bedtime.  Decrease naps during the day, so night time sleep will become enhanced.  Limit caffeine, and sleep deprivation.  HTN, stroke, and heart failure are potential risk factors.    EPWORTH SLEEP SCORE 11  Patient with history of anxiety disorder and being worked up for [facility] made referral to assess for sleep Apnea  Smoking cessation counseling provided  PAST MEDICAL HISTORY :   has a past medical history of Anxiety, Depression, Headache, History of kidney stones, Microscopic hematuria, Motion sickness, and Wears contact lenses.  has a past surgical history that includes Breast surgery; Mandible fracture surgery; Wisdom Teeth Surgery; Septoplasty (N/A, 11/17/2016); Endoscopic concha bullosa resection (Right, 11/17/2016); Turbinate reduction (Bilateral, 11/17/2016); and Tubal ligation (Bilateral, 10/26/2018). Prior to Admission medications   Medication Sig Start Date End Date Taking? Authorizing Provider  cetirizine (ZYRTEC) 10 MG tablet Take by mouth.    [provider]  fluconazole  (DIFLUCAN ) 150 MG tablet Take 150 mg by mouth once. 04/07/21   [provider]  fluticasone (FLONASE) 50 MCG/ACT nasal spray Place 2 sprays into both nostrils daily.    [provider]  hydrOXYzine  (VISTARIL ) 25 MG capsule Take 1-2 capsules (25-50 mg total) by mouth 3 times/day as needed-between meals & bedtime for anxiety (sleep). 05/21/18   Eappen, Saramma, MD  ibuprofen  (ADVIL ) 800 MG tablet Take 800 mg by mouth every 6 (six) hours as needed. 02/23/23   [provider]  ondansetron  (ZOFRAN -ODT) 4 MG disintegrating tablet Take 4 mg by mouth every 8 (eight) hours as needed. 02/27/23   [provider]  PREVIDENT 5000 DRY MOUTH 1.1 % GEL dental gel  05/18/19   [provider]  sertraline  (ZOLOFT ) 50 MG tablet TAKE 1 TABLET BY MOUTH DAILY 05/10/23   Eappen, Saramma, MD   Allergies  Allergen Reactions   Abilify [Aripiprazole]     LIGHT SENSITIVITY    FAMILY HISTORY:  family history includes Anxiety disorder in her daughter; Depression in her daughter and mother; Post-traumatic stress disorder in her daughter. SOCIAL HISTORY:  reports that she has been smoking e-cigarettes and cigarettes. She started smoking about 23 years ago. She has a 5.9 pack-year smoking history. She has never used smokeless tobacco. She reports current alcohol use. She reports that she does not use drugs.   Review of Systems:  Gen:  Denies  fever, sweats, chills weight loss  HEENT: Denies blurred vision, double vision, ear pain, eye pain, hearing loss, nose bleeds, sore throat Cardiac:  No dizziness, chest pain or heaviness, chest tightness,edema, No JVD Resp:   No cough, -sputum production, -shortness of breath,-wheezing, -hemoptysis,  Gi: Denies swallowing difficulty, stomach pain, nausea or vomiting, diarrhea, constipation, bowel incontinence Gu:  Denies bladder incontinence, burning urine Ext:   Denies Joint  pain, stiffness or swelling Skin: Denies  skin rash, easy bruising or bleeding or hives Endoc:  Denies polyuria, polydipsia , polyphagia or weight  change Psych:   Denies depression, insomnia or hallucinations  Other:  All other systems negative   ALL OTHER ROS ARE NEGATIVE   BP 110/60 (BP Location: Left Arm, Patient Position: Sitting, Cuff Size: Normal)   Pulse 80   Temp 97.6 F (36.4 C) (Temporal)   Ht 5' 6 (1.676 m)   Wt 150 lb (68 kg)   LMP  (LMP Unknown)   SpO2 100%   BMI 24.21 kg/m    Physical Examination:   General Appearance: No distress  EYES PERRLA, EOM intact.   NECK Supple, No JVD Pulmonary: normal breath sounds, No wheezing.  CardiovascularNormal S1,S2.  No m/r/g.   Abdomen: Benign, Soft, non-tender. Skin:   warm, no rashes, no ecchymosis  Extremities: normal, no cyanosis, clubbing. Neuro:without focal findings,  speech normal  PSYCHIATRIC: Mood, affect within normal limits.   ALL OTHER ROS ARE NEGATIVE    ASSESSMENT AND PLAN SYNOPSIS  Patient with signs and symptoms of excessive daytime sleepiness with probable underlying diagnosis of obstructive sleep apnea  Smoking Assessment and Cessation Counseling Upon further questioning, Patient smokes 1 ppd I have advised patient to quit/stop smoking as soon as possible due to high risk for multiple medical problems  Patient  is NOT willing to quit smoking  I have advised patient that we can assist and have options of Nicotine  replacement therapy. I also advised patient on behavioral therapy and can provide oral medication therapy in conjunction with the other therapies Follow up next Office visit  for assessment of smoking cessation Smoking cessation counseling advised for >10 minutes  Recommend Sleep Study for definitve diagnosis   MEDICATION ADJUSTMENTS/LABS AND TESTS ORDERED: Recommend Sleep Study Please stop smoking      CURRENT MEDICATIONS REVIEWED AT LENGTH WITH PATIENT TODAY   Patient  satisfied with Plan of action and management. All questions answered  Follow up  8 weeks months  Total Time Spent  47 mins   Nickolas Alm Cellar,  M.D.  [facility]  Medical Director [facility] Medical Director [facility] Cardio-Pulmonary Department

## 2024-01-02 NOTE — Patient Instructions (Signed)
 Please stop smoking  Plan for Home sleep Study to assess for sleep apnea  Avoid Allergens and Irritants Avoid secondhand smoke Avoid SICK contacts Recommend  Masking  when appropriate Recommend Keep up-to-date with vaccinations

## 2024-01-03 ENCOUNTER — Other Ambulatory Visit: Payer: Self-pay | Admitting: Medical Genetics

## 2024-01-12 DIAGNOSIS — G4719 Other hypersomnia: Secondary | ICD-10-CM

## 2024-01-12 DIAGNOSIS — R0683 Snoring: Secondary | ICD-10-CM

## 2024-01-20 ENCOUNTER — Other Ambulatory Visit: Payer: Self-pay | Admitting: Psychiatry

## 2024-01-20 DIAGNOSIS — F17200 Nicotine dependence, unspecified, uncomplicated: Secondary | ICD-10-CM

## 2024-01-30 ENCOUNTER — Ambulatory Visit: Payer: BC Managed Care – PPO | Admitting: Podiatry

## 2024-02-02 ENCOUNTER — Ambulatory Visit (INDEPENDENT_AMBULATORY_CARE_PROVIDER_SITE_OTHER): Payer: BC Managed Care – PPO | Admitting: Podiatry

## 2024-02-02 ENCOUNTER — Ambulatory Visit (INDEPENDENT_AMBULATORY_CARE_PROVIDER_SITE_OTHER): Payer: BC Managed Care – PPO

## 2024-02-02 ENCOUNTER — Encounter: Payer: Self-pay | Admitting: Podiatry

## 2024-02-02 DIAGNOSIS — M2042 Other hammer toe(s) (acquired), left foot: Secondary | ICD-10-CM

## 2024-02-02 DIAGNOSIS — M2041 Other hammer toe(s) (acquired), right foot: Secondary | ICD-10-CM | POA: Diagnosis not present

## 2024-02-02 MED ORDER — MUPIROCIN 2 % EX OINT: 1.0000 | TOPICAL_OINTMENT | Freq: Two times a day (BID) | CUTANEOUS | 0 refills | Status: AC

## 2024-02-02 MED ORDER — FLUCONAZOLE 150 MG PO TABS: 150.0000 mg | ORAL_TABLET | Freq: Once | ORAL | 0 refills | Status: AC

## 2024-02-02 MED ORDER — DOXYCYCLINE HYCLATE 100 MG PO TABS: 100.0000 mg | ORAL_TABLET | Freq: Two times a day (BID) | ORAL | 0 refills | Status: DC

## 2024-02-05 ENCOUNTER — Encounter: Payer: Self-pay | Admitting: Internal Medicine

## 2024-02-05 NOTE — Progress Notes (Signed)
Chief Complaint  Patient presents with   Toe Pain    "There's redness on my second toe on both feet.  My right big toe is starting to get painful right around the nail." N - toe pain L - 2nd bilateral, hallux  right D - 2-3 weeks (2nd), hallux - about 5 days O - suddenly, gotten a little better C - sore, red spot, rough, swelling, sharp pain - 2nd rt, sore - toenail A - toe next to it, walking T - Epsom Salt soaks, Neosporin    HPI: 43 y.o. female presenting today as a new patient for evaluation of pain and tenderness associated to the second digits bilateral as well as the right great toe.  She is concerned for possible ingrown toenails or irritation of the toenail.  Past Medical History:  Diagnosis Date   Anxiety    Depression    Headache    sinus   History of kidney stones    Microscopic hematuria    Motion sickness    cars   Wears contact lenses     Past Surgical History:  Procedure Laterality Date   BREAST SURGERY     Implants   ENDOSCOPIC CONCHA BULLOSA RESECTION Right 11/17/2016   Procedure: ENDOSCOPIC CONCHA BULLOSA RESECTION;  Surgeon: Vernie Murders, MD;  Location: [facility];  Service: ENT;  Laterality: Right;   MANDIBLE FRACTURE SURGERY     SEPTOPLASTY N/A 11/17/2016   Procedure: SEPTOPLASTY;  Surgeon: Vernie Murders, MD;  Location: [facility];  Service: ENT;  Laterality: N/A;   TUBAL LIGATION Bilateral 10/26/2018   TURBINATE REDUCTION Bilateral 11/17/2016   Procedure: PARTIAL INFERIOR TURBINATE REDUCTION;  Surgeon: Vernie Murders, MD;  Location: [facility];  Service: ENT;  Laterality: Bilateral;   Wisdom Teeth Surgery      Allergies  Allergen Reactions   Abilify [Aripiprazole]     LIGHT SENSITIVITY     Physical Exam: General: The patient is alert and oriented x3 in no acute distress.  Dermatology: Skin is warm, dry and supple bilateral lower extremities.  There is some mild localized erythema with edema and puffiness noted  encompassing the periungual borders of the right hallux nail plate.  The nail plate is well adhered however.  There is no purulence.  Vascular: Palpable pedal pulses bilaterally. Capillary refill within normal limits.  No appreciable edema.  No erythema.  Neurological: Grossly intact via light touch  Musculoskeletal Exam: The second toes bilateral are longer extending past the normal digital parabola likely creating increased friction in close toed shoes.  Radiographic Exam B/L feet 02/05/2024:  Normal osseous mineralization. Joint spaces preserved.  No fractures or osseous irregularities noted.  Impression: Negative  Assessment/Plan of Care: 1.  Paronychia right hallux nail plate 2.  Morton's toe bilateral second digits  -Patient evaluated.  X-rays reviewed -Explained the pathology and etiology of the second digits that seem to be rubbing against the close toed shoes creating redness to the distal tips of the second toes.  There is no infection related to these areas and this is chronic.  Recommend appropriate fitting shoes that do not irritate or constrict the second toes -In regards to the right hallux nail plate, the erythema encompasses the nail plate extending all the way across the base of the nail plate as well.  However the nail plate is intact and there is no purulence noted. -Prescription for doxycycline 100 mg twice daily x 10 days -Description for mupirocin ointment apply twice daily -  Return to clinic 3 weeks       Felecia Shelling, DPM [facility]  Dr. Felecia Shelling, DPM    [address] [place], [place] [address]                Office [phone]  Fax [phone]

## 2024-02-27 ENCOUNTER — Ambulatory Visit: Payer: BC Managed Care – PPO | Admitting: Internal Medicine

## 2024-03-08 ENCOUNTER — Other Ambulatory Visit

## 2024-03-15 ENCOUNTER — Other Ambulatory Visit

## 2024-03-20 ENCOUNTER — Other Ambulatory Visit: Payer: Self-pay

## 2024-03-21 ENCOUNTER — Ambulatory Visit: Admitting: Gastroenterology

## 2024-03-21 VITALS — BP 103/67 | HR 79 | Temp 98.7°F | Ht 66.0 in | Wt 140.0 lb

## 2024-03-21 DIAGNOSIS — K529 Noninfective gastroenteritis and colitis, unspecified: Secondary | ICD-10-CM

## 2024-03-21 DIAGNOSIS — R933 Abnormal findings on diagnostic imaging of other parts of digestive tract: Secondary | ICD-10-CM

## 2024-03-21 DIAGNOSIS — K5900 Constipation, unspecified: Secondary | ICD-10-CM

## 2024-03-21 DIAGNOSIS — R1031 Right lower quadrant pain: Secondary | ICD-10-CM | POA: Diagnosis not present

## 2024-03-21 NOTE — Patient Instructions (Signed)
 Please try the Miralax samples and if you have good results this can be purchased at [facility] or Drug store.   Polyethylene Glycol Powder for Solution What is this medication? POLYETHYLENE GLYCOL (pol ee ETH i leen; GLYE col) prevents and treats occasional constipation. It works by increasing the amount of water your intestine absorbs. This softens the stool, making it easier to have a bowel movement. It also increases pressure, which prompts the muscles in your intestines to move stool. It belongs to a group of medications called laxatives. This medicine may be used for other purposes; ask your health care provider or pharmacist if you have questions. COMMON BRAND NAME(S): GaviLax, GIALAX, GlycoLax, Healthylax, MiraLax, Smooth LAX, True Laxative Powder, [facility] What should I tell my care team before I take this medication? They need to know if you have any of these conditions: History of blockage in your bowels Nausea Phenylketonuria Stomach or intestine problem Stomach pain Sudden change in bowel habit lasting more than 2 weeks Vomiting An unusual or allergic reaction to polyethylene glycol (PEG), other medications, foods, dyes, or preservatives Pregnant or trying to get pregnant Breast-feeding How should I use this medication? Take this medication by mouth. Take it as directed on the label. Add the right dose to 4 to 8 ounces or 120 to 240 mL of water, juice, soda, coffee, or tea. Do not mix this medication with foods or other liquids. Do not combine with starch-based thickeners (e.g., flour, cornstarch, arrowroot, tapioca, xanthan gum). Mix well. Drink the solution. Do not use it more often than directed. Talk to your care team about the use of this medication in children. While it may be given to children as young as 16 years for selected conditions, precautions do apply. Overdosage: If you think you have taken too much of this medicine contact a poison control center or emergency room  at once. NOTE: This medicine is only for you. Do not share this medicine with others. What if I miss a dose? If you miss a dose, take it as soon as you can. If it is almost time for your next dose, take only that dose. Do not take double or extra doses. What may interact with this medication? Interactions are not expected. This list may not describe all possible interactions. Give your health care provider a list of all the medicines, herbs, non-prescription drugs, or dietary supplements you use. Also tell them if you smoke, drink alcohol, or use illegal drugs. Some items may interact with your medicine. What should I watch for while using this medication? Do not use for more than one week without advice from your care team. If your constipation returns, check with your care team. Drink plenty of water while taking this medication. Drinking water helps decrease constipation. Stop using this medication and contact your care team if you experience any rectal bleeding or do not have a bowel movement after use. These could be signs of a more serious condition. What side effects may I notice from receiving this medication? Side effects that you should report to your care team as soon as possible: Allergic reactions--skin rash, itching, hives, swelling of the face, lips, tongue, or throat Side effects that usually do not require medical attention (report to your care team if they continue or are bothersome): Bloating Gas Nausea Stomach cramping This list may not describe all possible side effects. Call your doctor for medical advice about side effects. You may report side effects to FDA at 1-800-FDA-1088. Where  should I keep my medication? Keep out of the reach of children and pets. Store at room temperature between 20 and 25 degrees C (68 and 77 degrees F). Get rid of any unused medication after the expiration date. To get rid of medications that are no longer needed or have expired: Take the  medication to a medication take-back program. Check with your pharmacy or law enforcement to find a location. If you cannot return the medication, check the label or package insert to see if the medication should be thrown out in the garbage or flushed down the toilet. If you are not sure, ask your care team. If it is safe to put it in the trash, pour the medication out of the container. Mix the medication with cat litter, dirt, coffee grounds, or other unwanted substance. Seal the mixture in a bag or container. Put it in the trash. NOTE: This sheet is a summary. It may not cover all possible information. If you have questions about this medicine, talk to your doctor, pharmacist, or health care provider.  2024 Elsevier/Gold Standard (2022-06-15 00:00:00)High-Fiber Eating Plan Fiber, also called dietary fiber, is found in foods such as fruits, vegetables, whole grains, and beans. A high-fiber diet can be good for your health. Your health care provider may recommend a high-fiber diet to help: Prevent trouble pooping (constipation). Lower your cholesterol. Treat the following conditions: Hemorrhoids. This is inflammation of veins in the anus. Inflammation of specific areas of the digestive tract. Irritable bowel syndrome (IBS). This is a problem of the large intestine, also called the colon, that sometimes causes belly pain and bloating. Prevent overeating as part of a weight-loss plan. Lower the risk of heart disease, type 2 diabetes, and certain cancers. What are tips for following this plan? Reading food labels  Check the nutrition facts label on foods for the amount of dietary fiber. Choose foods that have 4 grams of fiber or more per serving. The recommended goals for how much fiber you should eat each day include: Males 45 years old or younger: 30-34 g. Males over 29 years old: 28-34 g. Females 8 years old or younger: 25-28 g. Females over 91 years old: 22-25 g. Your daily fiber goal is  _____________ g. Shopping Choose whole fruits and vegetables instead of processed. For example, choose apples instead of apple juice or applesauce. Choose a variety of high-fiber foods such as avocados, lentils, oats, and pinto beans. Read the nutrition facts label on foods. Check for foods with added fiber. These foods often have high sugar and salt (sodium) amounts per serving. Cooking Use whole-grain flour for baking and cooking. Cook with brown rice instead of white rice. Make meals that have a lot of beans and vegetables in them, such as chili or vegetable-based soups. Meal planning Start the day with a breakfast that is high in fiber, such as a cereal that has 5 g of fiber or more per serving. Eat breads and cereals that are made with whole-grain flour instead of refined flour or white flour. Eat brown rice, bulgur wheat, or millet instead of white rice. Use beans in place of meat in soups, salads, and pasta dishes. Be sure that half of the grains you eat each day are whole grains. General information You can get the recommended amount of dietary fiber by: Eating a variety of fruits, vegetables, grains, nuts, and beans. Taking a fiber supplement if you aren't able to eat enough fiber. It's better to get fiber through food than  from a supplement. Slowly increase how much fiber you eat. If you increase the amount of fiber you eat too quickly, you may have bloating, cramping, or gas. Drink plenty of water to help you digest fiber. Choose high-fiber snacks, such as berries, raw vegetables, nuts, and popcorn. What foods should I eat? Fruits Berries. Pears. Apples. Oranges. Avocado. Prunes and raisins. Dried figs. Vegetables Sweet potatoes. Spinach. Kale. Artichokes. Cabbage. Broccoli. Cauliflower. Green peas. Carrots. Squash. Grains Whole-grain breads. Multigrain cereal. Oats and oatmeal. Brown rice. Barley. Bulgur wheat. Millet. Quinoa. Bran muffins. Popcorn. Rye wafer crackers. Meats  and other proteins Navy beans, kidney beans, and pinto beans. Soybeans. Split peas. Lentils. Nuts and seeds. Dairy Fiber-fortified yogurt. Fortified means that fiber has been added to the product. Beverages Fiber-fortified soy milk. Fiber-fortified orange juice. Other foods Fiber bars. The items listed above may not be all the foods and drinks you can have. Talk to a dietitian to learn more. What foods should I avoid? Fruits Fruit juice. Cooked, strained fruit. Vegetables Fried potatoes. Canned vegetables. Well-cooked vegetables. Grains White bread. Pasta made with refined flour. White rice. Meats and other proteins Fatty meat. Fried chicken or fried fish. Dairy Milk. Cream cheese. Sour cream. Fats and oils Butters. Beverages Soft drinks. Other foods Cakes and pastries. The items listed above may not be all the foods and drinks you should avoid. Talk to a dietitian to learn more. This information is not intended to replace advice given to you by your health care provider. Make sure you discuss any questions you have with your health care provider. Document Revised: 02/27/2023 Document Reviewed: 02/27/2023 Elsevier Patient Education  2024 [employer].

## 2024-03-21 NOTE — Progress Notes (Signed)
 Wyline Mood MD, MRCP(U.K) [address]  [address]  [place], [place] [address]  Main: [phone]  Fax: [phone]   Gastroenterology Consultation  Referring Provider:     Jerrilyn Cairo Primary Ca* Primary Care Physician:  Dorna Bloom, NP Primary Gastroenterologist:  Dr. Wyline Mood  Reason for Consultation:    Abnormal CT scan        HPI:   Janice Gray is a 43 y.o. y/o female referred for consultation & management  by Dorna Bloom, NP.      She has been referred for abnormal CT scan in February 2025 underwent a CT scan of the abdomen pelvis with contrast showed mild wall thickening of the ascending colon suggesting mild colitis and moderate to large amount of stool is vestiges take mild constipation.  Seen by surgeons at [facility] for umbilical hernia 02/01/2024 celiac serology tissue transglutaminase antibody negative.  In addition CBC and CMP, and lipase were normal.   She was seen at [facility] primary care in February for abdominal pain in the right lower quadrant going on for about 2 months after her hysterectomy.  The hysterectomy was performed laparoscopically.  Had been on meloxicam for cervical spine injury.  She says that the pain became in the right side a few months after her hysterectomy which was about a year back only occurs when she coughs feels like something is pulling on the insides of her abdomen.  She has been having issues with constipation after commencing on Adderall which has made the pain worse she was told in the past she had IBS constipation.  No blood in the stool no unintentional weight loss no other complaints presently.  She is taking some stool softeners but not having adequate bowel movements.  Past Medical History:  Diagnosis Date   Anxiety    Depression    Headache    sinus   History of kidney stones    Microscopic hematuria    Motion sickness    cars   Wears contact lenses     Past Surgical History:  Procedure Laterality Date   BREAST  SURGERY     Implants   ENDOSCOPIC CONCHA BULLOSA RESECTION Right 11/17/2016   Procedure: ENDOSCOPIC CONCHA BULLOSA RESECTION;  Surgeon: Vernie Murders, MD;  Location: [facility];  Service: ENT;  Laterality: Right;   MANDIBLE FRACTURE SURGERY     SEPTOPLASTY N/A 11/17/2016   Procedure: SEPTOPLASTY;  Surgeon: Vernie Murders, MD;  Location: [facility];  Service: ENT;  Laterality: N/A;   TUBAL LIGATION Bilateral 10/26/2018   TURBINATE REDUCTION Bilateral 11/17/2016   Procedure: PARTIAL INFERIOR TURBINATE REDUCTION;  Surgeon: Vernie Murders, MD;  Location: [facility];  Service: ENT;  Laterality: Bilateral;   Wisdom Teeth Surgery      Prior to Admission medications   Medication Sig Start Date End Date Taking? Authorizing Provider  cetirizine (ZYRTEC) 10 MG tablet Take by mouth.    [provider]  fluticasone (FLONASE) 50 MCG/ACT nasal spray Place 2 sprays into both nostrils daily.    [provider]  hydrOXYzine (VISTARIL) 25 MG capsule Take 1-2 capsules (25-50 mg total) by mouth 3 times/day as needed-between meals & bedtime for anxiety (sleep). Patient not taking: Reported on 02/02/2024 05/21/18   [name], MD  ibuprofen (ADVIL) 800 MG tablet Take 800 mg by mouth every 6 (six) hours as needed. 02/23/23   [provider]  mupirocin ointment (BACTROBAN) 2 % Apply 1 Application topically 2 (two)  times daily. 02/02/24   Felecia Shelling, DPM  ondansetron (ZOFRAN-ODT) 4 MG disintegrating tablet Take 4 mg by mouth every 8 (eight) hours as needed. 02/27/23   [provider]    Family History  Problem Relation Age of Onset   Depression Mother    Anxiety disorder Daughter    Depression Daughter    Post-traumatic stress disorder Daughter      Social History   Tobacco Use   Smoking status: Every Day    Current packs/day: 0.50    Average packs/day: 0.5 packs/day for 23.7 years (11.9 ttl pk-yrs)    Types: Cigarettes    Start date: 06/28/2000    Smokeless tobacco: Never  Vaping Use   Vaping status: Never Used  Substance Use Topics   Alcohol use: Not Currently    Comment: special occ./ social   Drug use: No    Allergies as of 03/21/2024 - Review Complete 03/21/2024  Allergen Reaction Noted   Abilify [aripiprazole]  03/19/2018    Review of Systems:    All systems reviewed and negative except where noted in HPI.   Physical Exam:  BP 103/67   Pulse 79   Temp 98.7 F (37.1 C) (Oral)   Ht 5\' 6"  (1.676 m)   Wt 140 lb (63.5 kg)   LMP  (LMP Unknown)   BMI 22.60 kg/m  No LMP recorded (lmp unknown). Patient has had a hysterectomy. Psych:  Alert and cooperative. Normal mood and affect. General:   Alert,  Well-developed, well-nourished, pleasant and cooperative in NAD Head:  Normocephalic and atraumatic. Eyes:  Sclera clear, no icterus.   Conjunctiva pink. Abdomen:  Normal bowel sounds.  No bruits.  Soft, non-tender and non-distended without masses, hepatosplenomegaly or hernias noted.  No guarding or rebound tenderness.    Neurologic:  Alert and oriented x3;  grossly normal neurologically. Psych:  Alert and cooperative. Normal mood and affect.  Imaging Studies: No results found.  Assessment and Plan:   Janice Gray is a 43 y.o. y/o female has been referred for mild colitis seen on CT scan of the abdomen associated with constipation with a background of right lower quadrant pain going on for a few months.  Could be related to constipation or at times related to colitis.  Lab work has been normal recently.  She has been seen and evaluated for her umbilical hernia by [facility] surgeons.  It is possible that the abdominal pain could also be related to adhesions because her symptoms began after her laparoscopic hysterectomy.  Her symptoms may be compounded by constipation  Plan 1.  Avoid all NSAIDs as it can cause colitis 2.  Commence on MiraLAX 1 capful twice a day for constipation in addition to that commence on high-fiber diet  with a target of 25 g/day patient information on high-fiber diet has been provided 3.  Colonoscopy if no better at next office visit.  No red flag signs    Follow up in 3 months  Dr Wyline Mood MD,MRCP(U.K)

## 2024-03-22 ENCOUNTER — Other Ambulatory Visit

## 2024-05-14 ENCOUNTER — Ambulatory Visit: Payer: BC Managed Care – PPO | Admitting: Psychiatry

## 2024-08-29 ENCOUNTER — Encounter: Payer: BC Managed Care – PPO | Admitting: Psychology

## 2024-10-01 ENCOUNTER — Other Ambulatory Visit: Payer: Self-pay | Admitting: Medical Genetics

## 2024-10-01 DIAGNOSIS — Z006 Encounter for examination for normal comparison and control in clinical research program: Secondary | ICD-10-CM

## 2024-11-01 LAB — GENECONNECT MOLECULAR SCREEN: Genetic Analysis Overall Interpretation: NEGATIVE
